# Patient Record
Sex: Female | Born: 1945 | Race: White | Hispanic: No | Smoking: Current some day smoker
Health system: Southern US, Community
[De-identification: ages and names within clinical notes are randomized; demographics above are authoritative.]

## PROBLEM LIST (undated history)

## (undated) ENCOUNTER — Inpatient Hospital Stay (HOSPITAL_COMMUNITY): Payer: Medicare Other

## (undated) DIAGNOSIS — R3915 Urgency of urination: Secondary | ICD-10-CM

## (undated) DIAGNOSIS — Z87442 Personal history of urinary calculi: Secondary | ICD-10-CM

## (undated) DIAGNOSIS — R112 Nausea with vomiting, unspecified: Secondary | ICD-10-CM

## (undated) DIAGNOSIS — M549 Dorsalgia, unspecified: Secondary | ICD-10-CM

## (undated) DIAGNOSIS — M2550 Pain in unspecified joint: Secondary | ICD-10-CM

## (undated) DIAGNOSIS — R351 Nocturia: Secondary | ICD-10-CM

## (undated) DIAGNOSIS — M2540 Effusion, unspecified joint: Secondary | ICD-10-CM

## (undated) DIAGNOSIS — F329 Major depressive disorder, single episode, unspecified: Secondary | ICD-10-CM

## (undated) DIAGNOSIS — Z8709 Personal history of other diseases of the respiratory system: Secondary | ICD-10-CM

## (undated) DIAGNOSIS — Z860100 Personal history of colon polyps, unspecified: Secondary | ICD-10-CM

## (undated) DIAGNOSIS — G4730 Sleep apnea, unspecified: Secondary | ICD-10-CM

## (undated) DIAGNOSIS — N289 Disorder of kidney and ureter, unspecified: Secondary | ICD-10-CM

## (undated) DIAGNOSIS — J45909 Unspecified asthma, uncomplicated: Secondary | ICD-10-CM

## (undated) DIAGNOSIS — G8929 Other chronic pain: Secondary | ICD-10-CM

## (undated) DIAGNOSIS — M1990 Unspecified osteoarthritis, unspecified site: Secondary | ICD-10-CM

## (undated) DIAGNOSIS — F32A Depression, unspecified: Secondary | ICD-10-CM

## (undated) DIAGNOSIS — M779 Enthesopathy, unspecified: Secondary | ICD-10-CM

## (undated) DIAGNOSIS — G4700 Insomnia, unspecified: Secondary | ICD-10-CM

## (undated) DIAGNOSIS — F419 Anxiety disorder, unspecified: Secondary | ICD-10-CM

## (undated) DIAGNOSIS — J189 Pneumonia, unspecified organism: Secondary | ICD-10-CM

## (undated) DIAGNOSIS — J449 Chronic obstructive pulmonary disease, unspecified: Secondary | ICD-10-CM

## (undated) DIAGNOSIS — Z9889 Other specified postprocedural states: Secondary | ICD-10-CM

## (undated) DIAGNOSIS — K219 Gastro-esophageal reflux disease without esophagitis: Secondary | ICD-10-CM

## (undated) DIAGNOSIS — E039 Hypothyroidism, unspecified: Secondary | ICD-10-CM

## (undated) HISTORY — PX: OTHER SURGICAL HISTORY: SHX169

## (undated) HISTORY — PX: COLONOSCOPY: SHX174

## (undated) HISTORY — PX: ESOPHAGOGASTRODUODENOSCOPY: SHX1529

## (undated) HISTORY — PX: ABDOMINAL HYSTERECTOMY: SHX81

---

## 2004-07-01 ENCOUNTER — Ambulatory Visit: Payer: Self-pay | Admitting: Psychiatry

## 2004-07-01 ENCOUNTER — Inpatient Hospital Stay (HOSPITAL_COMMUNITY): Admission: RE | Admit: 2004-07-01 | Discharge: 2004-07-07 | Payer: Self-pay | Admitting: Psychiatry

## 2009-08-03 ENCOUNTER — Encounter: Admission: RE | Admit: 2009-08-03 | Discharge: 2009-08-03 | Payer: Self-pay | Admitting: Specialist

## 2010-04-05 ENCOUNTER — Emergency Department (HOSPITAL_BASED_OUTPATIENT_CLINIC_OR_DEPARTMENT_OTHER): Admission: EM | Admit: 2010-04-05 | Discharge: 2010-04-05 | Payer: Self-pay | Admitting: Emergency Medicine

## 2010-06-19 ENCOUNTER — Encounter: Payer: Self-pay | Admitting: Specialist

## 2010-08-09 LAB — COMPREHENSIVE METABOLIC PANEL
ALT: 23 U/L (ref 0–35)
AST: 39 U/L — ABNORMAL HIGH (ref 0–37)
Albumin: 3.9 g/dL (ref 3.5–5.2)
Calcium: 9.1 mg/dL (ref 8.4–10.5)
GFR calc Af Amer: >60 mL/min (ref >60)
GFR calc non Af Amer: >60 mL/min (ref >60)
Sodium: 144 mEq/L (ref 135–145)
Total Bilirubin: 0.4 mg/dL (ref 0.3–1.2)
Total Protein: 6.9 g/dL (ref 6.0–8.3)

## 2010-08-09 LAB — DIFFERENTIAL
Basophils Absolute: 0.1 10*3/uL (ref 0.0–0.1)
Basophils Relative: 1 % (ref 0–1)
Eosinophils Absolute: 0.2 10*3/uL (ref 0.0–0.7)
Monocytes Absolute: 0.9 10*3/uL (ref 0.1–1.0)
Neutro Abs: 5.7 10*3/uL (ref 1.7–7.7)
Neutrophils Relative %: 48 % (ref 43–77)

## 2010-08-09 LAB — URINALYSIS, ROUTINE W REFLEX MICROSCOPIC
Hgb urine dipstick: NEGATIVE
Ketones, ur: NEGATIVE mg/dL
Nitrite: NEGATIVE
Specific Gravity, Urine: 1.009 (ref 1.005–1.030)

## 2010-08-09 LAB — CBC
MCHC: 33.7 g/dL (ref 30.0–36.0)
RDW: 14.5 % (ref 11.5–15.5)

## 2010-08-09 LAB — POCT TOXICOLOGY PANEL

## 2010-08-09 LAB — ETHANOL: Alcohol, Ethyl (B): <10 mg/dL (ref 0–10)

## 2010-10-14 NOTE — H&P (Signed)
NAME:  Patricia Sutton, Patricia Sutton NO.:  [id number]   MEDICAL RECORD NO.:  [id number]          PATIENT TYPE:  IPS   LOCATION:  0407                          FACILITY:  [facility]   PHYSICIAN:  Jeanice Lim, M.D. DATE OF BIRTH:  05-14-1946   DATE OF ADMISSION:  07/01/2004  DATE OF DISCHARGE:                         PSYCHIATRIC ADMISSION ASSESSMENT   IDENTIFYING INFORMATION:  This is a 65 year old married white female who  appears much older than stated age.  Apparently, the patient presented to  her mental health Neale Marzette yesterday complaining that she had been having  visual hallucinations of her deceased daughter for several days and had been  staying in bed the last few days.  Today, she states that increasing her  Seroquel last night got rid of the visual hallucinations, that she is not  seeing her daughter anymore.  Apparently, her daughter was diagnosed with  Hodgkin's disease after she graduated high school.  This was approximately  12 years ago.  Then, her daughter developed breast cancer and this is what  she eventually succumbed to.  The patient states that her depression began  when her daughter was diagnosed with Hodgkin's disease.   PAST PSYCHIATRIC HISTORY:  Her prior psychiatric care was an admission to  [facility] in 2004 and 2005.  She has been followed as an  outpatient since 2001 at [facility].   SOCIAL HISTORY:  She finished the 10th grade.  She has been married for 40  years.  She has not been employed outside of the home.  She was a homemaker.  She does have a son, age 28.   FAMILY HISTORY:  None.   ALCOHOL/DRUG HISTORY:  Negative.  She does smoke one pack of cigarettes per  day.   MEDICAL HISTORY:  Her medical care Adem Costlow is Dr. Malka So in [facility].  She is also followed by Dr. Judy Pimple in neurology.  Medical problems  are Parkinson's and tardive dyskinesia.  The patient has new dentures of a  month old that do  not fit correctly and that is also adding to her mouth  movements.   CURRENT MEDICATIONS:  Aricept 10 mg p.o. q.a.m., Effexor 300 mg in the  morning, 75 mg q.h.s., Seroquel 12.5 mg b.i.d., 50 mg q.h.s., Xanax 1 mg  t.i.d. and Wellbutrin 150 mg p.o. q.a.m.   ALLERGIES:  There were No known drug allergies.   PHYSICAL EXAMINATION:  Well-developed, well-nourished female in no acute  distress.  However, her face is quite lined and she appears much older than  her stated age.   LABORATORY DATA:  Her albumin is slightly low at 3.3 but, given that she has  not been eating well over the past month due to the denture change, that  would be reasonable.  She had no other abnormal lab findings.   MENTAL STATUS EXAM:  She is alert and oriented x 3.  Her appearance is  somewhat disheveled.  Otherwise, she is appropriately dressed.  Her gait and  motor are normal.  Her eye contact is good.  Her speech is  a normal rate,  rhythm and tone.  Her mood is somewhat depressed.  Her affect does not have  a full range.  It is sort of flat.  Her thought processes are clear,  rational and goal-oriented.  She wants to get better.  Judgment and insight  are intact.  Concentration and memory are intact.  She denies suicidal or  homicidal ideation.  She denies auditory or visual hallucinations last night  or today.  Last night, we increased her Seroquel at bedtime to 150 mg.   DIAGNOSES:   AXIS I:  Major depressive disorder, recurrent, severe with psychosis.   AXIS II:  None.   AXIS III:  1.  Parkinson's disease.  2.  Tardive dyskinesia.   AXIS IV:  Grief.   AXIS V:  35.   PLAN:  She will be admitted for safety and stabilization.  We will adjust  her medications.  Toward that end, we will stop her Effexor and we will  increase her Wellbutrin to 300 mg p.o. q.d. and increase the Seroquel to 150  mg q.h.s.      MD/MEDQ  D:  07/02/2004  T:  07/02/2004  Job:  [id number]

## 2010-10-14 NOTE — Discharge Summary (Signed)
NAME:  Patricia Sutton, Patricia Sutton NO.:  [id number]   MEDICAL RECORD NO.:  [id number]          PATIENT TYPE:  IPS   LOCATION:  0407                          FACILITY:  [facility]   PHYSICIAN:  Geoffery Lyons, M.D.      DATE OF BIRTH:  May 28, 1946   DATE OF ADMISSION:  07/01/2004  DATE OF DISCHARGE:  07/07/2004                                 DISCHARGE SUMMARY   CHIEF COMPLAINT/HISTORY OF PRESENT ILLNESS:  This is a first admission to  [facility] for this 65 year old married white  female who appears much older than her stated age.  She presented to her  mental health Daphyne Miguez complaining that she had been having visual  hallucinations of her deceased daughter for several days and has been  staying in bed the last few days.  Easily admitted, Seroquel was increased  and she felt that got rid of the hallucinations, endorsed that she was not  seeing her daughter anymore.  Her daughter was diagnosed with Hodgkin's  after she graduated from high school, 12 years prior to this admission.  Then she developed breast cancer which was what she succumbed to.   PAST PSYCHIATRIC HISTORY:  She been in [facility] before in 2004 and  2005.  Follow up by [facility] since 2001.   ALCOHOL OR DRUG HISTORY:  Denies the use or abuse of any substances.   PAST MEDICAL HISTORY:  Parkinson's and tardive dyskinesia.   MEDICATIONS:  1.  Aricept 10 mg in the morning.  2.  Effexor 300 mg in the morning and 75 mg at night.  3.  Seroquel 12.5 twice daily and 150 at night.  4.  Xanax 1 mg three times a day.  5.  Wellbutrin 150 in the morning.   PHYSICAL EXAMINATION:  Exam performed with no acute findings.   LABORATORY WORKUP:  CBC was within normal limits.  Blood chemistry was  within normal limits.  TSH 2.384.   MENTAL STATUS EXAM:  Reveals an alert, cooperative female somewhat  disheveled.  Eye contact was appropriate.  Speech was normal rate, rhythm  and tone.   Mood was depressed.  Affect was somewhat constricted.  Thought  processes were clear, rational and goal-oriented.  Endorsed that she wanted  to get better.  Judgment and insight were preserved.  Cognition was  preserved.   ADMISSION DIAGNOSES:   AXIS I:  Major depression with psychotic features.   AXIS II:  No diagnosis.   AXIS III:  1.  Parkinson's disease.  2.  Tardive dyskinesia.   AXIS IV:  Moderate.   AXIS V:  Upon admission 35.  Highest in the last year 65-70.   COURSE IN HOSPITAL:  She was admitted, started in individual and group  psychotherapy.  She was maintained on Effexor 375 mg per day, Xanax 1 mg  three times a day as needed, Seroquel 25 mg 1/2 twice a day and Seroquel 150  at night.  Artane 1 mg three times a day, Aricept 10 mg in the morning,  oxybutynin 5 mg three times  a day.  She evidenced depression and tearfulness  when talking about her daughter, having hallucinations seeing her daughter.  Husband seemed to be supportive.  She was very distraught, very tearful,  could hardly talk, crying, did not sleep the night before.  Still dealing  with the death of the daughter which happened 4 years ago, she was  overwhelmed and feeling so bad.  Mood depressed, agitated.  Affect  depressed, crying, irritable, unable to settle herself down.  She continued  to be quite upset.  We tapered the Effexor and increased the Seroquel at  night.  With increasing the Seroquel at night, she started sleeping better.  Endorsed that she could not remember the details of the daughter's death.  She said that she wanted to remember them but she could not.  When she can  remember she gets more upset.  On February 8 she endorsed that she was  starting to feel better.  We were weaning off the Effexor and starting  Wellbutrin.  She started sleeping better, able to talk more about the death  of the daughter.  Endorsed that she felt the medications were helping in  conversation with the  husband.  She reported remembering her daughter's  funeral for the first time and able to accept her death.  She denied any  current suicidal ideations.  She was much better, and there was no problem  with the medications.  Her mood improved.  Her affect was broad, no suicidal  or homicidal ideation.  She was willing to pursue further outpatient  treatment.   DISCHARGE DIAGNOSES:   AXIS I:  Major depression with psychotic features.   AXIS II:  No diagnosis.   AXIS III:  1.  Parkinson's disease.  2.  Tardive dyskinesia.   AXIS IV:  Moderate.   AXIS V:  On discharge 50.   DISCHARGE MEDICATIONS:  1.  Xanax 1 mg three times a day as needed.  2.  Seroquel 25 mg 1 b.i.d. and Seroquel 200 at night.  3.  Artane 2 mg 1/2 three times a day.  4.  Aricept 10 mg daily.  5.  Ditropan 5 mg three times a day.  6.  Wellbutrin XL 300 mg in the morning.  7.  Effexor XR 37.5 one twice a day.  8.  Ambien 10 at bedtime as needed for sleep.   FOLLOW UP:  [facility].      IL/MEDQ  D:  07/29/2004  T:  07/30/2004  Job:  [id number]

## 2011-01-30 ENCOUNTER — Emergency Department (INDEPENDENT_AMBULATORY_CARE_PROVIDER_SITE_OTHER): Payer: Medicare Other

## 2011-01-30 ENCOUNTER — Emergency Department (HOSPITAL_BASED_OUTPATIENT_CLINIC_OR_DEPARTMENT_OTHER)
Admission: EM | Admit: 2011-01-30 | Discharge: 2011-01-30 | Disposition: A | Discharge status: Home / Self Care | Payer: Medicare Other | Attending: Emergency Medicine | Admitting: Emergency Medicine

## 2011-01-30 ENCOUNTER — Other Ambulatory Visit: Payer: Self-pay

## 2011-01-30 ENCOUNTER — Encounter: Payer: Self-pay | Admitting: *Deleted

## 2011-01-30 DIAGNOSIS — N390 Urinary tract infection, site not specified: Secondary | ICD-10-CM

## 2011-01-30 DIAGNOSIS — X58XXXA Exposure to other specified factors, initial encounter: Secondary | ICD-10-CM

## 2011-01-30 DIAGNOSIS — R5381 Other malaise: Secondary | ICD-10-CM

## 2011-01-30 DIAGNOSIS — F329 Major depressive disorder, single episode, unspecified: Secondary | ICD-10-CM | POA: Insufficient documentation

## 2011-01-30 DIAGNOSIS — E876 Hypokalemia: Secondary | ICD-10-CM

## 2011-01-30 DIAGNOSIS — S2239XA Fracture of one rib, unspecified side, initial encounter for closed fracture: Secondary | ICD-10-CM

## 2011-01-30 DIAGNOSIS — F17200 Nicotine dependence, unspecified, uncomplicated: Secondary | ICD-10-CM | POA: Insufficient documentation

## 2011-01-30 DIAGNOSIS — E119 Type 2 diabetes mellitus without complications: Secondary | ICD-10-CM | POA: Insufficient documentation

## 2011-01-30 DIAGNOSIS — R5383 Other fatigue: Secondary | ICD-10-CM | POA: Insufficient documentation

## 2011-01-30 DIAGNOSIS — F3289 Other specified depressive episodes: Secondary | ICD-10-CM | POA: Insufficient documentation

## 2011-01-30 HISTORY — DX: Major depressive disorder, single episode, unspecified: F32.9

## 2011-01-30 HISTORY — DX: Depression, unspecified: F32.A

## 2011-01-30 LAB — DIFFERENTIAL
Basophils Relative: 0 % (ref 0–1)
Eosinophils Absolute: 0.2 10*3/uL (ref 0.0–0.7)
Eosinophils Relative: 3 % (ref 0–5)
Lymphocytes Relative: 31 % (ref 12–46)
Lymphs Abs: 2.7 10*3/uL (ref 0.7–4.0)
Monocytes Absolute: 0.9 10*3/uL (ref 0.1–1.0)
Monocytes Relative: 10 % (ref 3–12)

## 2011-01-30 LAB — BASIC METABOLIC PANEL
BUN: 9 mg/dL (ref 6–23)
Creatinine, Ser: 0.8 mg/dL (ref 0.50–1.10)
GFR calc Af Amer: >60 mL/min (ref >60)
GFR calc non Af Amer: >60 mL/min (ref >60)
Glucose, Bld: 112 mg/dL — ABNORMAL HIGH (ref 70–99)
Potassium: 3.2 mEq/L — ABNORMAL LOW (ref 3.5–5.1)

## 2011-01-30 LAB — URINE MICROSCOPIC-ADD ON

## 2011-01-30 LAB — URINALYSIS, ROUTINE W REFLEX MICROSCOPIC
Bilirubin Urine: NEGATIVE
Glucose, UA: NEGATIVE mg/dL
Nitrite: POSITIVE — AB
Specific Gravity, Urine: 1.009 (ref 1.005–1.030)

## 2011-01-30 LAB — CBC
HCT: 37.3 % (ref 36.0–46.0)
Hemoglobin: 12.4 g/dL (ref 12.0–15.0)
MCH: 31.2 pg (ref 26.0–34.0)
RBC: 3.98 MIL/uL (ref 3.87–5.11)
RDW: 13.9 % (ref 11.5–15.5)

## 2011-01-30 LAB — GLUCOSE, CAPILLARY: Glucose-Capillary: 117 mg/dL — ABNORMAL HIGH (ref 70–99)

## 2011-01-30 MED ORDER — SODIUM CHLORIDE 0.9 % IV BOLUS (SEPSIS)
500.0000 mL | Freq: Once | INTRAVENOUS | Status: AC
  Administered 2011-01-30: 500 mL via INTRAVENOUS

## 2011-01-30 MED ORDER — DEXTROSE 5 % IV SOLN
1.0000 g | Freq: Once | INTRAVENOUS | Status: AC
  Administered 2011-01-30: 1 g via INTRAVENOUS
  Filled 2011-01-30: qty 1

## 2011-01-30 MED ORDER — SULFAMETHOXAZOLE-TRIMETHOPRIM 800-160 MG PO TABS: 1.0000 | ORAL_TABLET | Freq: Two times a day (BID) | ORAL | Status: AC

## 2011-01-30 NOTE — ED Provider Notes (Signed)
History     CSN: [id number] Arrival date & time: 01/30/2011  1:21 PM  Chief Complaint  Patient presents with  . Weakness   HPI Comments: Pt state that she is having urinary frequency  Patient is a 65 y.o. female presenting with weakness. The history is provided by the patient and a relative. No language interpreter was used.  Weakness Primary symptoms do not include loss of sensation, fever, nausea or vomiting. The symptoms are improving. The neurological symptoms are diffuse.  Additional symptoms include weakness. Additional symptoms do not include tinnitus.    Past Medical History  Diagnosis Date  . Depression   . Diabetes mellitus     No past surgical history on file.  No family history on file.  History  Substance Use Topics  . Smoking status: Current Everyday Smoker -- 0.5 packs/day  . Smokeless tobacco: Not on file  . Alcohol Use: No    OB History    Grav Para Term Preterm Abortions TAB SAB Ect Mult Living                  Review of Systems  Constitutional: Negative for fever.  HENT: Negative for tinnitus.   Respiratory: Negative for cough.   Gastrointestinal: Negative for nausea and vomiting.  Neurological: Positive for weakness.  All other systems reviewed and are negative.    Physical Exam  BP 126/64  Pulse 64  Temp(Src) 98.7 F (37.1 C) (Oral)  Resp 20  SpO2 96%  Physical Exam  Nursing note and vitals reviewed. Constitutional: She is oriented to person, place, and time. She appears well-developed and well-nourished.  HENT:  Head: Normocephalic.  Neck: Normal range of motion. Neck supple.  Cardiovascular: Normal rate and regular rhythm.   Pulmonary/Chest: Effort normal and breath sounds normal.  Abdominal: Soft. Bowel sounds are normal.  Musculoskeletal: Normal range of motion.  Neurological: She is alert and oriented to person, place, and time.  Skin: Skin is warm and dry.  Psychiatric: She has a normal mood and affect.    ED Course    Procedures Results for orders placed during the hospital encounter of 01/30/11  URINALYSIS, ROUTINE W REFLEX MICROSCOPIC      Component Value Range   Color, Urine YELLOW  YELLOW    Appearance CLOUDY (*) CLEAR    Specific Gravity, Urine 1.009  1.005 - 1.030    pH 6.5  5.0 - 8.0    Glucose, UA NEGATIVE  NEGATIVE (mg/dL)   Hgb urine dipstick TRACE (*) NEGATIVE    Bilirubin Urine NEGATIVE  NEGATIVE    Ketones, ur NEGATIVE  NEGATIVE (mg/dL)   Protein, ur NEGATIVE  NEGATIVE (mg/dL)   Urobilinogen, UA 2.0 (*) 0.0 - 1.0 (mg/dL)   Nitrite POSITIVE (*) NEGATIVE    Leukocytes, UA LARGE (*) NEGATIVE   URINE MICROSCOPIC-ADD ON      Component Value Range   Squamous Epithelial / LPF FEW (*) RARE    WBC, UA TOO NUMEROUS TO COUNT  <3 (WBC/hpf)   RBC / HPF 0-2  <3 (RBC/hpf)   Bacteria, UA MANY (*) RARE   GLUCOSE, CAPILLARY      Component Value Range   Glucose-Capillary 117 (*) 70 - 99 (mg/dL)  CBC      Component Value Range   WBC 8.8  4.0 - 10.5 (K/uL)   RBC 3.98  3.87 - 5.11 (MIL/uL)   Hemoglobin 12.4  12.0 - 15.0 (g/dL)   HCT 21.3  08.6 - 57.8 (%)  MCV 93.7  78.0 - 100.0 (fL)   MCH 31.2  26.0 - 34.0 (pg)   MCHC 33.2  30.0 - 36.0 (g/dL)   RDW 62.9  52.8 - 41.3 (%)   Platelets 307  150 - 400 (K/uL)  DIFFERENTIAL      Component Value Range   Neutrophils Relative 56  43 - 77 (%)   Neutro Abs 5.0  1.7 - 7.7 (K/uL)   Lymphocytes Relative 31  12 - 46 (%)   Lymphs Abs 2.7  0.7 - 4.0 (K/uL)   Monocytes Relative 10  3 - 12 (%)   Monocytes Absolute 0.9  0.1 - 1.0 (K/uL)   Eosinophils Relative 3  0 - 5 (%)   Eosinophils Absolute 0.2  0.0 - 0.7 (K/uL)   Basophils Relative 0  0 - 1 (%)   Basophils Absolute 0.0  0.0 - 0.1 (K/uL)  BASIC METABOLIC PANEL      Component Value Range   Sodium 141  135 - 145 (mEq/L)   Potassium 3.2 (*) 3.5 - 5.1 (mEq/L)   Chloride 100  96 - 112 (mEq/L)   CO2 32  19 - 32 (mEq/L)   Glucose, Bld 112 (*) 70 - 99 (mg/dL)   BUN 9  6 - 23 (mg/dL)   Creatinine, Ser 2.44   0.50 - 1.10 (mg/dL)   Calcium 9.4  8.4 - 01.0 (mg/dL)   GFR calc non Af Amer >60  >60 (mL/min)   GFR calc Af Amer >60  >60 (mL/min)   Dg Chest 2 View  01/30/2011  *RADIOLOGY REPORT*  Clinical Data: Weakness.  CHEST - 2 VIEW  Comparison: None.  Findings: Trachea is midline.  Heart size normal.  Mild biapical pleural parenchymal scarring.  Mild hyperinflation.  Lungs are otherwise clear.  No pleural fluid.  Degenerative changes are seen in the spine.  There is an age indeterminate fracture of the right seventh lateral rib.  IMPRESSION:  1.  Age indeterminate fracture of the right seventh lateral rib. 2.  No acute intrathoracic findings.  Original Report Authenticated By: Reyes Ivan, M.D.     MDM Pt ambulated by nurse without any problem:pt treated for uti here:Pt is tolerating po here      Teressa Lower, NP 01/30/11 1626  Teressa Lower, NP 01/30/11 1631

## 2011-01-30 NOTE — ED Notes (Signed)
Pt. Expressed need to urinate.  RN Earlene Plater walked Pt. To restroom.  Pt. Has unstedy gait to restroom but walked and did not have complications.

## 2011-01-30 NOTE — ED Provider Notes (Signed)
Medical screening examination/treatment/procedure(s) were performed by non-physician practitioner and as supervising physician I was immediately available for consultation/collaboration.   Jhovanny Guinta, MD 01/30/11 2331 

## 2011-01-30 NOTE — ED Notes (Signed)
Family at bedside. 

## 2011-01-30 NOTE — ED Notes (Signed)
Weakness and soreness x 4 days. Thinks she has a UTI.

## 2011-01-30 NOTE — ED Notes (Signed)
Patient is resting comfortably. 

## 2011-01-30 NOTE — ED Notes (Signed)
Pt. Opens eyes to voice and follows commands of RN and EDP.  Pt. In no resp. Distress and is alert and oriented when not sleeping.

## 2011-01-30 NOTE — ED Notes (Signed)
Vital signs stable. 

## 2011-01-30 NOTE — ED Notes (Signed)
Pt. Expressed that is burns to urinate and she has trouble urinating.

## 2011-01-30 NOTE — ED Notes (Signed)
Patient denies pain and is resting comfortably.  

## 2011-03-09 ENCOUNTER — Emergency Department (INDEPENDENT_AMBULATORY_CARE_PROVIDER_SITE_OTHER): Payer: Medicare Other

## 2011-03-09 ENCOUNTER — Emergency Department (HOSPITAL_BASED_OUTPATIENT_CLINIC_OR_DEPARTMENT_OTHER)
Admission: EM | Admit: 2011-03-09 | Discharge: 2011-03-09 | Disposition: A | Discharge status: Home / Self Care | Payer: Medicare Other | Attending: Emergency Medicine | Admitting: Emergency Medicine

## 2011-03-09 ENCOUNTER — Encounter (HOSPITAL_BASED_OUTPATIENT_CLINIC_OR_DEPARTMENT_OTHER): Payer: Self-pay | Admitting: *Deleted

## 2011-03-09 DIAGNOSIS — R5381 Other malaise: Secondary | ICD-10-CM | POA: Insufficient documentation

## 2011-03-09 DIAGNOSIS — R630 Anorexia: Secondary | ICD-10-CM

## 2011-03-09 DIAGNOSIS — F17200 Nicotine dependence, unspecified, uncomplicated: Secondary | ICD-10-CM | POA: Insufficient documentation

## 2011-03-09 DIAGNOSIS — E119 Type 2 diabetes mellitus without complications: Secondary | ICD-10-CM | POA: Insufficient documentation

## 2011-03-09 DIAGNOSIS — R05 Cough: Secondary | ICD-10-CM

## 2011-03-09 DIAGNOSIS — Z87891 Personal history of nicotine dependence: Secondary | ICD-10-CM

## 2011-03-09 DIAGNOSIS — F329 Major depressive disorder, single episode, unspecified: Secondary | ICD-10-CM | POA: Insufficient documentation

## 2011-03-09 DIAGNOSIS — N390 Urinary tract infection, site not specified: Secondary | ICD-10-CM

## 2011-03-09 DIAGNOSIS — F3289 Other specified depressive episodes: Secondary | ICD-10-CM | POA: Insufficient documentation

## 2011-03-09 LAB — COMPREHENSIVE METABOLIC PANEL
ALT: 12 U/L (ref 0–35)
BUN: 9 mg/dL (ref 6–23)
CO2: 29 mEq/L (ref 19–32)
Calcium: 9.4 mg/dL (ref 8.4–10.5)
GFR calc Af Amer: >90 mL/min (ref >90)
GFR calc non Af Amer: >90 mL/min (ref >90)
Glucose, Bld: 119 mg/dL — ABNORMAL HIGH (ref 70–99)
Sodium: 143 mEq/L (ref 135–145)
Total Protein: 6.8 g/dL (ref 6.0–8.3)

## 2011-03-09 LAB — DIFFERENTIAL
Eosinophils Absolute: 0.5 10*3/uL (ref 0.0–0.7)
Eosinophils Relative: 6 % — ABNORMAL HIGH (ref 0–5)
Lymphocytes Relative: 43 % (ref 12–46)
Lymphs Abs: 3.6 10*3/uL (ref 0.7–4.0)
Monocytes Absolute: 0.8 10*3/uL (ref 0.1–1.0)
Monocytes Relative: 9 % (ref 3–12)

## 2011-03-09 LAB — URINALYSIS, ROUTINE W REFLEX MICROSCOPIC
Bilirubin Urine: NEGATIVE
Hgb urine dipstick: NEGATIVE
Protein, ur: NEGATIVE mg/dL
Specific Gravity, Urine: 1.022 (ref 1.005–1.030)
Urobilinogen, UA: 0.2 mg/dL (ref 0.0–1.0)

## 2011-03-09 LAB — CBC
HCT: 38.5 % (ref 36.0–46.0)
Hemoglobin: 12.8 g/dL (ref 12.0–15.0)
MCH: 30.1 pg (ref 26.0–34.0)
MCV: 90.6 fL (ref 78.0–100.0)
RBC: 4.25 MIL/uL (ref 3.87–5.11)
WBC: 8.3 10*3/uL (ref 4.0–10.5)

## 2011-03-09 LAB — URINE MICROSCOPIC-ADD ON

## 2011-03-09 MED ORDER — FLUCONAZOLE 50 MG PO TABS
150.0000 mg | ORAL_TABLET | Freq: Once | ORAL | Status: AC
  Administered 2011-03-09: 150 mg via ORAL
  Filled 2011-03-09: qty 1

## 2011-03-09 MED ORDER — CEPHALEXIN 250 MG PO CAPS
500.0000 mg | ORAL_CAPSULE | Freq: Once | ORAL | Status: AC
  Administered 2011-03-09: 500 mg via ORAL
  Filled 2011-03-09: qty 2

## 2011-03-09 MED ORDER — SODIUM CHLORIDE 0.9 % IV SOLN: Freq: Once | INTRAVENOUS | Status: DC

## 2011-03-09 MED ORDER — DEXTROSE 5 % IV SOLN
1.0000 g | INTRAVENOUS | Status: DC
  Administered 2011-03-09: 1 g via INTRAVENOUS
  Filled 2011-03-09: qty 1

## 2011-03-09 MED ORDER — CEPHALEXIN 500 MG PO CAPS: 500.0000 mg | ORAL_CAPSULE | Freq: Four times a day (QID) | ORAL | Status: AC

## 2011-03-09 NOTE — ED Provider Notes (Signed)
History     CSN: [id number] Arrival date & time: 03/09/2011  2:19 PM  Chief Complaint  Patient presents with  . Weakness    (Consider location/radiation/quality/duration/timing/severity/associated sxs/prior treatment) Patient is a 65 y.o. female presenting with general illness.  Illness  The current episode started 2 days ago. The onset was gradual. The problem occurs continuously. The problem has been gradually worsening. The problem is moderate. The symptoms are relieved by nothing (NOTHING). The symptoms are aggravated by nothing. Associated symptoms include nausea, muscle aches and cough. Pertinent negatives include no fever, no URI and no wheezing. She has been behaving normally. There were no sick contacts. Recently, medical care has been given by the PCP.  Pt complains of burning with urination.  Pt reports she has had a uti in the past and felt similar.  Pt complains of feeling weak.  Past Medical History  Diagnosis Date  . Depression   . Diabetes mellitus     Past Surgical History  Procedure Date  . Abdominal hysterectomy     No family history on file.  History  Substance Use Topics  . Smoking status: Current Everyday Smoker -- 0.5 packs/day  . Smokeless tobacco: Not on file  . Alcohol Use: No    OB History    Grav Para Term Preterm Abortions TAB SAB Ect Mult Living                  Review of Systems  Constitutional: Negative for fever.  Respiratory: Positive for cough. Negative for wheezing.   Gastrointestinal: Positive for nausea.  Genitourinary: Positive for dysuria.  All other systems reviewed and are negative.    Allergies  Review of patient's allergies indicates no known allergies.  Home Medications   Current Outpatient Rx  Name Route Sig Dispense Refill  . BUSPIRONE HCL 7.5 MG PO TABS Oral Take 7.5 mg by mouth 2 (two) times daily.      . CYMBALTA PO Oral Take by mouth.      . AMBIEN PO Oral Take by mouth.      Marland Kitchen CLINDAMYCIN HCL 300 MG PO  CAPS Oral Take 300 mg by mouth 3 (three) times daily.        BP 128/65  Pulse 75  Temp(Src) 98.2 F (36.8 C) (Oral)  Resp 20  SpO2 95%  Physical Exam  Nursing note and vitals reviewed. Constitutional: She appears well-developed and well-nourished.  HENT:  Head: Normocephalic and atraumatic.  Right Ear: External ear normal.  Left Ear: External ear normal.  Eyes: Conjunctivae and EOM are normal. Pupils are equal, round, and reactive to light.  Neck: Normal range of motion. Neck supple.  Cardiovascular: Normal rate and normal heart sounds.   Pulmonary/Chest: Effort normal.  Abdominal: Soft.  Musculoskeletal: Normal range of motion.  Neurological: She is alert.  Skin: Skin is warm.    ED Course  Procedures (including critical care time)  Labs Reviewed  URINALYSIS, ROUTINE W REFLEX MICROSCOPIC - Abnormal; Notable for the following:    Appearance CLOUDY (*)    Nitrite POSITIVE (*)    Leukocytes, UA SMALL (*)    All other components within normal limits  URINE MICROSCOPIC-ADD ON - Abnormal; Notable for the following:    Bacteria, UA MANY (*)    Crystals CA OXALATE CRYSTALS (*)    All other components within normal limits   No results found.   No diagnosis found.    MDM  Pt given IV rocephin,  IV fluids x  1 liter Pt advised to recheck with her MD tomorrow as scheduled.       Langston Masker, [place] 03/09/11 1843

## 2011-03-09 NOTE — ED Notes (Signed)
Pt says she has been in the bed since Sunday because she is so weak and feels "swimmy-headed" when she gets up.  Also c/o pain, itching and soreness with urination for a couple of weeks

## 2011-03-09 NOTE — ED Provider Notes (Signed)
Medical screening examination/treatment/procedure(s) were performed by non-physician practitioner and as supervising physician I was immediately available for consultation/collaboration.   Leigh-Ann Kolten Ryback, MD 03/09/11 1905 

## 2011-06-29 ENCOUNTER — Emergency Department (INDEPENDENT_AMBULATORY_CARE_PROVIDER_SITE_OTHER): Payer: Medicare Other

## 2011-06-29 ENCOUNTER — Other Ambulatory Visit: Payer: Self-pay

## 2011-06-29 ENCOUNTER — Encounter (HOSPITAL_BASED_OUTPATIENT_CLINIC_OR_DEPARTMENT_OTHER): Payer: Self-pay | Admitting: *Deleted

## 2011-06-29 ENCOUNTER — Emergency Department (HOSPITAL_BASED_OUTPATIENT_CLINIC_OR_DEPARTMENT_OTHER)
Admission: EM | Admit: 2011-06-29 | Discharge: 2011-06-29 | Disposition: A | Discharge status: Other Acute Inpatient Hospital | Payer: Medicare Other | Attending: Emergency Medicine | Admitting: Emergency Medicine

## 2011-06-29 DIAGNOSIS — K56609 Unspecified intestinal obstruction, unspecified as to partial versus complete obstruction: Secondary | ICD-10-CM | POA: Insufficient documentation

## 2011-06-29 DIAGNOSIS — R1013 Epigastric pain: Secondary | ICD-10-CM | POA: Insufficient documentation

## 2011-06-29 DIAGNOSIS — R109 Unspecified abdominal pain: Secondary | ICD-10-CM

## 2011-06-29 DIAGNOSIS — R9431 Abnormal electrocardiogram [ECG] [EKG]: Secondary | ICD-10-CM | POA: Insufficient documentation

## 2011-06-29 DIAGNOSIS — M549 Dorsalgia, unspecified: Secondary | ICD-10-CM

## 2011-06-29 DIAGNOSIS — R112 Nausea with vomiting, unspecified: Secondary | ICD-10-CM | POA: Insufficient documentation

## 2011-06-29 DIAGNOSIS — F17200 Nicotine dependence, unspecified, uncomplicated: Secondary | ICD-10-CM | POA: Insufficient documentation

## 2011-06-29 LAB — URINALYSIS, ROUTINE W REFLEX MICROSCOPIC
Bilirubin Urine: NEGATIVE
Bilirubin Urine: NEGATIVE
Glucose, UA: NEGATIVE mg/dL
Hgb urine dipstick: NEGATIVE
Ketones, ur: NEGATIVE mg/dL
Ketones, ur: NEGATIVE mg/dL
Leukocytes, UA: NEGATIVE
Nitrite: NEGATIVE
Nitrite: NEGATIVE
Specific Gravity, Urine: 1.004 — ABNORMAL LOW (ref 1.005–1.030)
Urobilinogen, UA: 0.2 mg/dL (ref 0.0–1.0)
pH: 6 (ref 5.0–8.0)
pH: 6 (ref 5.0–8.0)

## 2011-06-29 LAB — DIFFERENTIAL
Basophils Absolute: 0 10*3/uL (ref 0.0–0.1)
Basophils Relative: 0 % (ref 0–1)
Eosinophils Absolute: 0.3 10*3/uL (ref 0.0–0.7)
Neutro Abs: 4.8 10*3/uL (ref 1.7–7.7)
Neutrophils Relative %: 47 % (ref 43–77)

## 2011-06-29 LAB — GLUCOSE, CAPILLARY: Glucose-Capillary: 91 mg/dL (ref 70–99)

## 2011-06-29 LAB — COMPREHENSIVE METABOLIC PANEL
ALT: 12 U/L (ref 0–35)
AST: 17 U/L (ref 0–37)
Albumin: 3.6 g/dL (ref 3.5–5.2)
Alkaline Phosphatase: 101 U/L (ref 39–117)
Chloride: 102 mEq/L (ref 96–112)
Potassium: 4.5 mEq/L (ref 3.5–5.1)
Sodium: 140 mEq/L (ref 135–145)
Total Bilirubin: 0.1 mg/dL — ABNORMAL LOW (ref 0.3–1.2)
Total Protein: 7 g/dL (ref 6.0–8.3)

## 2011-06-29 LAB — TROPONIN I: Troponin I: <0.3 ng/mL (ref <0.30)

## 2011-06-29 LAB — CBC
MCH: 30.8 pg (ref 26.0–34.0)
MCHC: 34.1 g/dL (ref 30.0–36.0)
Platelets: 357 10*3/uL (ref 150–400)

## 2011-06-29 MED ORDER — HYDROMORPHONE HCL PF 1 MG/ML IJ SOLN
1.0000 mg | Freq: Once | INTRAMUSCULAR | Status: AC
  Administered 2011-06-29: 1 mg via INTRAVENOUS
  Filled 2011-06-29: qty 1

## 2011-06-29 MED ORDER — IOHEXOL 300 MG/ML  SOLN
100.0000 mL | Freq: Once | INTRAMUSCULAR | Status: AC | PRN
  Administered 2011-06-29: 100 mL via INTRAVENOUS

## 2011-06-29 MED ORDER — HYDROMORPHONE HCL PF 1 MG/ML IJ SOLN
1.0000 mg | Freq: Once | INTRAMUSCULAR | Status: AC
  Administered 2011-06-29: 1 mg via INTRAVENOUS

## 2011-06-29 MED ORDER — ONDANSETRON HCL 4 MG/2ML IJ SOLN
4.0000 mg | Freq: Once | INTRAMUSCULAR | Status: AC
  Administered 2011-06-29: 4 mg via INTRAVENOUS
  Filled 2011-06-29: qty 2

## 2011-06-29 MED ORDER — HYDROMORPHONE HCL PF 1 MG/ML IJ SOLN
INTRAMUSCULAR | Status: AC
  Administered 2011-06-29: 1 mg via INTRAVENOUS
  Filled 2011-06-29: qty 1

## 2011-06-29 MED ORDER — BENZOCAINE 20 % MT SOLN
OROMUCOSAL | Status: AC
  Administered 2011-06-29: 17:00:00
  Filled 2011-06-29: qty 57

## 2011-06-29 NOTE — ED Provider Notes (Addendum)
History     CSN: [id number]  Arrival date & time 06/29/11  1330   First MD Initiated Contact with Patient 06/29/11 1344      Chief Complaint  Patient presents with  . Abdominal Pain    (Consider location/radiation/quality/duration/timing/severity/associated sxs/prior treatment) HPI Comments: Patient is a 66 year old she started having pain in the right side of her back week and a half ago. This seemed to hurt her more when she would walk. He has gotten worse over the week and a half. Now the pain has gone into her upper abdomen. She had nausea yesterday and vomiting today. She took ibuprofen without relief. She called her physician, Oneal Grout, M.D. who advised her to seek evaluation in the ED.  Patient is a 66 y.o. female presenting with abdominal pain. The history is provided by the patient. No language interpreter was used.  Abdominal Pain The primary symptoms of the illness include abdominal pain, nausea and vomiting. The primary symptoms of the illness do not include fever. The current episode started more than 2 days ago. The onset of the illness was gradual. The problem has been gradually worsening.  The abdominal pain began more than 2 days ago. The pain came on gradually. The abdominal pain has been gradually worsening since its onset. The abdominal pain is located in the epigastric region. The abdominal pain does not radiate. The severity of the abdominal pain is 5/10. The abdominal pain is relieved by nothing. Exacerbated by: Nothing.  The patient has not had a change in bowel habit. Additional symptoms associated with the illness include chills, anorexia and back pain. Symptoms associated with the illness do not include constipation, urgency or hematuria. Significant associated medical issues include diabetes.    Past Medical History  Diagnosis Date  . Depression   . Diabetes mellitus     Past Surgical History  Procedure Date  . Abdominal hysterectomy     History  reviewed. No pertinent family history.  History  Substance Use Topics  . Smoking status: Current Everyday Smoker -- 0.5 packs/day  . Smokeless tobacco: Not on file  . Alcohol Use: No    OB History    Grav Para Term Preterm Abortions TAB SAB Ect Mult Living                  Review of Systems  Constitutional: Positive for chills. Negative for fever.  HENT: Negative.   Eyes: Negative.   Respiratory: Negative.   Cardiovascular: Negative.   Gastrointestinal: Positive for nausea, vomiting, abdominal pain and anorexia. Negative for constipation.  Genitourinary: Negative.  Negative for urgency and hematuria.  Musculoskeletal: Positive for back pain.  Neurological: Negative.   Psychiatric/Behavioral: Negative.     Allergies  Review of patient's allergies indicates no known allergies.  Home Medications   Current Outpatient Rx  Name Route Sig Dispense Refill  . BUSPIRONE HCL 7.5 MG PO TABS Oral Take 7.5 mg by mouth 2 (two) times daily.      Marland Kitchen CLINDAMYCIN HCL 300 MG PO CAPS Oral Take 300 mg by mouth 3 (three) times daily.      . CYMBALTA PO Oral Take by mouth.      . AMBIEN PO Oral Take by mouth.        BP 135/63  Pulse 79  Temp(Src) 98.7 F (37.1 C) (Oral)  Resp 22  SpO2 98%  Physical Exam  Nursing note and vitals reviewed. Constitutional: She is oriented to person, place, and time. She appears well-developed  and well-nourished.       In moderate distress with upper abdominal pain.  HENT:  Head: Normocephalic and atraumatic.  Right Ear: External ear normal.  Left Ear: External ear normal.  Mouth/Throat: Oropharynx is clear and moist.       Edentulous  Eyes: Conjunctivae and EOM are normal. Pupils are equal, round, and reactive to light.  Neck: Normal range of motion. Neck supple. No thyromegaly present.  Cardiovascular: Normal rate, regular rhythm and normal heart sounds.   Pulmonary/Chest: Effort normal and breath sounds normal.  Abdominal: Soft. Bowel sounds are  normal.       She localizes pain to the epigastric region. There is no mass rebound or rigidity there.  Musculoskeletal: Normal range of motion.  Neurological: She is alert and oriented to person, place, and time.       No sensory or motor deficit.  Skin: Skin is warm and dry.  Psychiatric: She has a normal mood and affect. Her behavior is normal.    ED Course  Procedures (including critical care time)  Labs Reviewed  URINALYSIS, ROUTINE W REFLEX MICROSCOPIC - Abnormal; Notable for the following:    Specific Gravity, Urine 1.004 (*)    All other components within normal limits  CBC  DIFFERENTIAL  COMPREHENSIVE METABOLIC PANEL  URINALYSIS, ROUTINE W REFLEX MICROSCOPIC  URINE CULTURE  LIPASE, BLOOD   2:20 PM Pt seen --> physical exam performed.  Lab workup ordered.  CT of abdomen/pelvis ordered.  IV fluid, IV medications for pain and nausea ordered.    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  Low voltage in limb leads.  ST/T Wave abnormalities: normal  Conduction Disutrbances:none  Narrative Interpretation: Abnormal EKG  Old EKG Reviewed: none available  4:08 PM Results for orders placed during the hospital encounter of 06/29/11  URINALYSIS, ROUTINE W REFLEX MICROSCOPIC      Component Value Range   Color, Urine YELLOW  YELLOW    APPearance CLEAR  CLEAR    Specific Gravity, Urine 1.004 (*) 1.005 - 1.030    pH 6.0  5.0 - 8.0    Glucose, UA NEGATIVE  NEGATIVE (mg/dL)   Hgb urine dipstick NEGATIVE  NEGATIVE    Bilirubin Urine NEGATIVE  NEGATIVE    Ketones, ur NEGATIVE  NEGATIVE (mg/dL)   Protein, ur NEGATIVE  NEGATIVE (mg/dL)   Urobilinogen, UA 0.2  0.0 - 1.0 (mg/dL)   Nitrite NEGATIVE  NEGATIVE    Leukocytes, UA NEGATIVE  NEGATIVE   CBC      Component Value Range   WBC 10.3  4.0 - 10.5 (K/uL)   RBC 4.35  3.87 -  5.11 (MIL/uL)   Hemoglobin 13.4  12.0 - 15.0 (g/dL)   HCT 45.4  09.8 - 11.9 (%)   MCV 90.3  78.0 - 100.0 (fL)   MCH 30.8  26.0 - 34.0 (pg)   MCHC 34.1  30.0 - 36.0 (g/dL)   RDW 14.7  82.9 - 56.2 (%)   Platelets 357  150 - 400 (K/uL)  DIFFERENTIAL      Component Value Range   Neutrophils Relative 47  43 - 77 (%)   Neutro Abs 4.8  1.7 - 7.7 (K/uL)   Lymphocytes Relative 41  12 - 46 (%)   Lymphs Abs 4.2 (*) 0.7 - 4.0 (K/uL)   Monocytes Relative 10  3 - 12 (%)   Monocytes Absolute 1.0  0.1 - 1.0 (K/uL)   Eosinophils Relative 3  0 - 5 (%)   Eosinophils Absolute 0.3  0.0 - 0.7 (K/uL)   Basophils Relative 0  0 - 1 (%)   Basophils Absolute 0.0  0.0 - 0.1 (K/uL)  COMPREHENSIVE METABOLIC PANEL      Component Value Range   Sodium 140  135 - 145 (mEq/L)   Potassium 4.5  3.5 - 5.1 (mEq/L)   Chloride 102  96 - 112 (mEq/L)   CO2 29  19 - 32 (mEq/L)   Glucose, Bld 129 (*) 70 - 99 (mg/dL)   BUN 13  6 - 23 (mg/dL)   Creatinine, Ser 8.11  0.50 - 1.10 (mg/dL)   Calcium 9.7  8.4 - 91.4 (mg/dL)   Total Protein 7.0  6.0 - 8.3 (g/dL)   Albumin 3.6  3.5 - 5.2 (g/dL)   AST 17  0 - 37 (U/L)   ALT 12  0 - 35 (U/L)   Alkaline Phosphatase 101  39 - 117 (U/L)   Total Bilirubin 0.1 (*) 0.3 - 1.2 (mg/dL)   GFR calc non Af Amer 77 (*) >90 (mL/min)   GFR calc Af Amer 89 (*) >90 (mL/min)  URINALYSIS, ROUTINE W REFLEX MICROSCOPIC      Component Value Range   Color, Urine YELLOW  YELLOW    APPearance CLEAR  CLEAR    Specific Gravity, Urine 1.004 (*) 1.005 - 1.030    pH 6.0  5.0 - 8.0    Glucose, UA NEGATIVE  NEGATIVE (mg/dL)   Hgb urine dipstick NEGATIVE  NEGATIVE    Bilirubin Urine NEGATIVE  NEGATIVE    Ketones, ur NEGATIVE  NEGATIVE (mg/dL)   Protein, ur NEGATIVE  NEGATIVE (mg/dL)   Urobilinogen, UA 0.2  0.0 - 1.0 (mg/dL)   Nitrite NEGATIVE  NEGATIVE    Leukocytes, UA NEGATIVE  NEGATIVE   LIPASE, BLOOD      Component Value Range   Lipase 50  11 - 59 (U/L)  TROPONIN I      Component Value Range    Troponin I <0.30  <0.30 (ng/mL)   Ct Abdomen Pelvis W Contrast  06/29/2011  *RADIOLOGY REPORT*  Clinical Data: Back pain.  Abdominal pain.  Epigastric pain.  CT ABDOMEN AND PELVIS WITH CONTRAST  Technique:  Multidetector CT imaging of the abdomen and pelvis was performed following the standard protocol during bolus administration of intravenous contrast.  Contrast: OMNIPAQUE IOHEXOL 300 MG/ML IV SOLN  Comparison: None.  Findings: Lung bases appear within normal limits.  Liver and gallbladder normal.  No calcified gallstones.  There is a mild focal dilation of the main pancreatic duct in the pancreatic head, measuring 6 mm.  Proximal and distal duct demonstrate normal caliber.  No mass lesion is identified.  The adrenal glands are normal.  Normal renal enhancement.  Spleen appears normal.  Proximal small bowel appears within normal limits. There is dilation of distal small bowel with adherent small bowel contents distally.  There is a focal transition point in the anatomic pelvis with decompressed ileum.  Findings compatible with mechanical small bowel obstruction.  This may be adhesive in a patient with history of prior abdominal surgery. Fecalized small bowel is present (image 38 series 5).  Hysterectomy.  Stool is present within the colon.  Normal appendix is present.  Abdominal vasculature appears within normal limits. Urinary bladder normal.  IMPRESSION: 1. Small bowel obstruction with transition  point in the right lower quadrant.  This probably represents adhesive small bowel obstruction in patient with history of prior abdominal surgery. 2.  Mild dilation of the main pancreatic duct in the pancreatic head. Follow-up MRI of the pancreas with without infusion and MRCP recommended. IPMN could produce this appearance.  Non-emergent MRI should be deferred until patient has been discharged for the acute illness, and can optimally cooperate with positioning and breath- holding instructions.  Original Report  Authenticated By: Andreas Newport, M.D.    CT of the abdomen and pelvis shows small bowel obstruction with transition point in the pelvis. Radiologist thinks this is due to adhesions from prior surgery. We'll consult Gen. surgery and arrange for admission for treatment of this.    1. Small bowel obstruction      4:20 PM Case discussed with Dr. Johna Sheriff via the OR nurse at [facility].  He requests pt be transferred to the [facility] ED, where he will see her.    4:37 PM Pt and her son want her to go to [facility].  Dr. Preston Fleeting will take the calls to arrange this.   Carleene Cooper III, MD 06/29/11 1431  Carleene Cooper III, MD 06/29/11 [phone]

## 2011-06-29 NOTE — ED Provider Notes (Signed)
I discussed the case with the surgeon on call at [facility] and with Dr. Johny Drilling,  hospitalist on-call at [facility]. Dr. Johny Drilling agrees to accept the patient and the surgeon states that he will see the patient in consult.  Dione Booze, MD 06/29/11 1655

## 2011-06-29 NOTE — ED Notes (Signed)
Report to Apolinar Junes, Charity fundraiser at [facility]

## 2011-06-29 NOTE — ED Notes (Signed)
abd pain for over a week. Urinary frequency. Nausea this am.

## 2011-06-30 LAB — URINE CULTURE

## 2011-10-26 ENCOUNTER — Emergency Department (HOSPITAL_BASED_OUTPATIENT_CLINIC_OR_DEPARTMENT_OTHER): Payer: Medicare Other

## 2011-10-26 ENCOUNTER — Emergency Department (HOSPITAL_BASED_OUTPATIENT_CLINIC_OR_DEPARTMENT_OTHER)
Admission: EM | Admit: 2011-10-26 | Discharge: 2011-10-26 | Disposition: A | Discharge status: Home / Self Care | Payer: Medicare Other | Attending: Emergency Medicine | Admitting: Emergency Medicine

## 2011-10-26 ENCOUNTER — Encounter (HOSPITAL_BASED_OUTPATIENT_CLINIC_OR_DEPARTMENT_OTHER): Payer: Self-pay | Admitting: Family Medicine

## 2011-10-26 DIAGNOSIS — M25569 Pain in unspecified knee: Secondary | ICD-10-CM | POA: Insufficient documentation

## 2011-10-26 DIAGNOSIS — R109 Unspecified abdominal pain: Secondary | ICD-10-CM | POA: Insufficient documentation

## 2011-10-26 DIAGNOSIS — M25519 Pain in unspecified shoulder: Secondary | ICD-10-CM | POA: Insufficient documentation

## 2011-10-26 DIAGNOSIS — W1809XA Striking against other object with subsequent fall, initial encounter: Secondary | ICD-10-CM | POA: Insufficient documentation

## 2011-10-26 DIAGNOSIS — W010XXA Fall on same level from slipping, tripping and stumbling without subsequent striking against object, initial encounter: Secondary | ICD-10-CM | POA: Insufficient documentation

## 2011-10-26 DIAGNOSIS — R609 Edema, unspecified: Secondary | ICD-10-CM | POA: Insufficient documentation

## 2011-10-26 DIAGNOSIS — T148XXA Other injury of unspecified body region, initial encounter: Secondary | ICD-10-CM

## 2011-10-26 DIAGNOSIS — T07XXXA Unspecified multiple injuries, initial encounter: Secondary | ICD-10-CM

## 2011-10-26 DIAGNOSIS — IMO0002 Reserved for concepts with insufficient information to code with codable children: Secondary | ICD-10-CM | POA: Insufficient documentation

## 2011-10-26 DIAGNOSIS — M47812 Spondylosis without myelopathy or radiculopathy, cervical region: Secondary | ICD-10-CM | POA: Insufficient documentation

## 2011-10-26 HISTORY — DX: Chronic obstructive pulmonary disease, unspecified: J44.9

## 2011-10-26 MED ORDER — ONDANSETRON 4 MG PO TBDP
ORAL_TABLET | ORAL | Status: AC
  Administered 2011-10-26: 4 mg
  Filled 2011-10-26: qty 1

## 2011-10-26 MED ORDER — HYDROCODONE-ACETAMINOPHEN 5-325 MG PO TABS: 2.0000 | ORAL_TABLET | ORAL | Status: DC | PRN

## 2011-10-26 MED ORDER — TETANUS-DIPHTH-ACELL PERTUSSIS 5-2.5-18.5 LF-MCG/0.5 IM SUSP
0.5000 mL | Freq: Once | INTRAMUSCULAR | Status: AC
  Administered 2011-10-26: 0.5 mL via INTRAMUSCULAR
  Filled 2011-10-26: qty 0.5

## 2011-10-26 MED ORDER — HYDROCODONE-ACETAMINOPHEN 5-325 MG PO TABS
2.0000 | ORAL_TABLET | Freq: Once | ORAL | Status: AC
  Administered 2011-10-26: 2 via ORAL
  Filled 2011-10-26: qty 2

## 2011-10-26 MED ORDER — ACETAMINOPHEN 500 MG PO TABS
ORAL_TABLET | ORAL | Status: AC
  Filled 2011-10-26: qty 2

## 2011-10-26 NOTE — ED Notes (Signed)
Pt returned from radiology.

## 2011-10-26 NOTE — Discharge Instructions (Signed)
Abrasions Abrasions are skin scrapes. Their treatment depends on how large and deep the abrasion is. Abrasions do not extend through all layers of the skin. A cut or lesion through all skin layers is called a laceration. HOME CARE INSTRUCTIONS   If you were given a dressing, change it at least once a day or as instructed by your caregiver. If the bandage sticks, soak it off with a solution of water or hydrogen peroxide.   Twice a day, wash the area with soap and water to remove all the cream/ointment. You may do this in a sink, under a tub faucet, or in a shower. Rinse off the soap and pat dry with a clean towel. Look for signs of infection (see below).   Reapply cream/ointment according to your caregiver's instruction. This will help prevent infection and keep the bandage from sticking. Telfa or gauze over the wound and under the dressing or wrap will also help keep the bandage from sticking.   If the bandage becomes wet, dirty, or develops a foul smell, change it as soon as possible.   Only take over-the-counter or prescription medicines for pain, discomfort, or fever as directed by your caregiver.  SEEK IMMEDIATE MEDICAL CARE IF:   Increasing pain in the wound.   Signs of infection develop: redness, swelling, surrounding area is tender to touch, or pus coming from the wound.   You have a fever.   Any foul smell coming from the wound or dressing.  Most skin wounds heal within ten days. Facial wounds heal faster. However, an infection may occur despite proper treatment. You should have the wound checked for signs of infection within 24 to 48 hours or sooner if problems arise. If you were not given a wound-check appointment, look closely at the wound yourself on the second day for early signs of infection listed above. MAKE SURE YOU:   Understand these instructions.   Will watch your condition.   Will get help right away if you are not doing well or get worse.  Document Released:  02/22/2005 Document Revised: 05/04/2011 Document Reviewed: 04/18/2011 [employer] Patient Information 2012 [employer].Contusion A contusion is a deep bruise. Contusions are the result of an injury that caused bleeding under the skin. The contusion may turn blue, purple, or yellow. Minor injuries will give you a painless contusion, but more severe contusions may stay painful and swollen for a few weeks.  CAUSES  A contusion is usually caused by a blow, trauma, or direct force to an area of the body. SYMPTOMS   Swelling and redness of the injured area.   Bruising of the injured area.   Tenderness and soreness of the injured area.   Pain.  DIAGNOSIS  The diagnosis can be made by taking a history and physical exam. An X-ray, CT scan, or MRI may be needed to determine if there were any associated injuries, such as fractures. TREATMENT  Specific treatment will depend on what area of the body was injured. In general, the best treatment for a contusion is resting, icing, elevating, and applying cold compresses to the injured area. Over-the-counter medicines may also be recommended for pain control. Ask your caregiver what the best treatment is for your contusion. HOME CARE INSTRUCTIONS   Put ice on the injured area.   Put ice in a plastic bag.   Place a towel between your skin and the bag.   Leave the ice on for 15 to 20 minutes, 3 to 4 times a day.     Only take over-the-counter or prescription medicines for pain, discomfort, or fever as directed by your caregiver. Your caregiver may recommend avoiding anti-inflammatory medicines (aspirin, ibuprofen, and naproxen) for 48 hours because these medicines may increase bruising.   Rest the injured area.   If possible, elevate the injured area to reduce swelling.  SEEK IMMEDIATE MEDICAL CARE IF:   You have increased bruising or swelling.   You have pain that is getting worse.   Your swelling or pain is not relieved with medicines.  MAKE  SURE YOU:   Understand these instructions.   Will watch your condition.   Will get help right away if you are not doing well or get worse.  Document Released: 02/22/2005 Document Revised: 05/04/2011 Document Reviewed: 03/20/2011 [facility] Patient Information 2012 [place], [place].Home Safety and Preventing Falls Falls are a leading cause of injury and while they affect all age groups, falls have greater short-term and long-term impact on older age groups. However, falls should not be a part of life or aging. It is possible for individuals and their families to use preventive measures to significantly decrease the likelihood that anyone, especially an older adult, will fall. There are many simple measures which can make your home safer with respect to preventing falls. The following actions can help reduce falls among all members of your family and are especially important as you age, when your balance, lower limb strength, coordination, and eyesight may be declining. The use of preventive measures will help to reduce you and your family's risk of falls and serious medical consequences. OUTDOORS  Repair cracks and edges of walkways and driveways.   Remove high doorway thresholds and trim shrubbery on the main path into your home.   Ensure there is good outside lighting at main entrances and along main walkways.   Clear walkways of tools, rocks, debris, and clutter.   Check that handrails are not broken and are securely fastened. Both sides of steps should have handrails.   In the garage, be attentive to and clean up grease or oil spills on the cement. This can make the surface extremely slippery.   In winter, have leaves, snow, and ice cleared regularly.   Use sand or salt on walkways during winter months.  BATHROOM  Install grab bars by the toilet and in the tub and shower.   Use non-skid mats or decals in the tub or shower.   If unable to easily stand unsupported while showering,  place a plastic non slip stool in the shower to sit on when needed.   Install night lights.   Keep floors dry and clean up all water on the floor immediately.   Remove soap buildup in tub or shower on a regular basis.   Secure bath mats with non-slip, double-sided rug tape.   Remove tripping hazards from the floors.  BEDROOMS  Install night lights.   Do not use oversized bedding.   Make sure a bedside light is easy to reach.   Keep a telephone by your bedside.   Make sure that you can get in and out of your bed easily.   Have a firm chair, with side arms, to use for getting dressed.   Remove clutter from around closets.   Store clothing, bed coverings, and other household items where you can reach them comfortably.   Remove tripping hazards from the floor.  LIVING AREAS AND STAIRWAYS  Turn on lights to avoid having to walk through dark areas.   Keep lighting  uniform in each room. Place brighter lightbulbs in darker areas, including stairways.   Replace lightbulbs that burn out in stairways immediately.   Arrange furniture to provide for clear pathways.   Keep furniture in the same place.   Eliminate or tape down electrical cables in high traffic areas.   Place handrails on both sides of stairways. Use handrails when going up or down stairs.   Most falls occur on the top or bottom 3 steps.   Fix any loose handrails. Make sure handrails on both sides of the stairways are as long as the stairs.   Remove all walkway obstacles.   Coil or tape electrical cords off to the side of walking areas and out of the way. If using many extension cords, have an electrician put in a new wall outlet to reduce or eliminate them.   Make sure spills are cleaned up quickly and allow time for drying before walking on freshly cleaned floors.   Firmly attach carpet with non-skid or two-sided tape.   Keep frequently used items within easy reach.   Remove tripping hazards such as  throw rugs and clutter in walkways. Never leave objects on stairs.   Get rid of throw rugs elsewhere if possible.   Eliminate uneven floor surfaces.   Make sure couches and chairs are easy to get into and out of.   Check carpeting to make sure it is firmly attached along stairs.   Make repairs to worn or loose carpet promptly.   Select a carpet pattern that does not visually hide the edge of steps.   Avoid placing throw rugs or scatter rugs at the top or bottom of stairways, or properly secure with carpet tape to prevent slippage.   Have an electrician put in a light switch at the top and bottom of the stairs.   Get light switches that glow.   Avoid the following practices: hurrying, inattention, obscured vision, carrying large loads, and wearing slip-on shoes.   Be aware of all pets.  KITCHEN  Place items that are used frequently, such as dishes and food, within easy reach.   Keep handles on pots and pans toward the center of the stove. Use back burners when possible.   Make sure spills are cleaned up quickly and allow time for drying.   Avoid walking on wet floors.   Avoid hot utensils and knives.   Position shelves so they are not too high or low.   Place commonly used objects within easy reach.   If necessary, use a sturdy step stool with a grab bar when reaching.   Make sure electrical cables are out of the way.   Do not use floor polish or wax that makes floors slippery.  OTHER HOME FALL PREVENTION STRATEGIES  Wear low heel or rubber sole shoes that are supportive and fit well.   Wear closed toe shoes.   Know and watch for side effects of medications. Have your caregiver or pharmacist look at all your medicines, even over-the-counter medicines. Some medicines can make you sleepy or dizzy.   Exercise regularly. Exercise makes you stronger and improves your balance and coordination.   Limit use of alcohol.   Use eyeglasses if necessary and keep them clean.  Have your vision checked every year.   Organize your household in a manner that minimizes the need to walk distances when hurried, or go up and down stairs unnecessarily. For example, have a phone placed on at least each floor of  your home. If possible, have a phone beside each sitting or lying area where you spend the most time at home. Keep emergency numbers posted at all phones.   Use non-skid floor wax.   When using a ladder, make sure:   The base is firm.   All ladder feet are on level ground.   The ladder is angled against the wall properly.   When climbing a ladder, face the ladder and hold the ladder rungs firmly.   If reaching, always keep your hips and body weight centered between the rails.   When using a stepladder, make sure it is fully opened and both spreaders are firmly locked.   Do not climb a closed stepladder.   Avoid climbing beyond the second step from the top of a stepladder and the 4th rung from the top of an extension ladder.   Learn and use mobility aids as needed.   Change positions slowly. Arise slowly from sitting and lying positions. Sit on the edge of your bed before getting to your feet.   If you have a history of falls, ask someone to add color or contrast paint or tape to grab bars and handrails in your home.   If you have a history of falls, ask someone to place contrasting color strips on first and last steps.   Install an electrical emergency response system if you need one, and know how to use it.   If you have a medical or other condition that causes you to have limited physical strength, it is important that you reach out to family and friends for occasional help.  FOR CHILDREN:  If young children are in the home, use safety gates. At the top of stairs use screw-mounted gates; use pressure-mounted gates for the bottom of the stairs and doorways between rooms.   Young children should be taught to descend stairs on their stomachs, feet  first, and later using the handrail.   Keep drawers fully closed to prevent them from being climbed on or pulled out entirely.   Move chairs, cribs, beds and other furniture away from windows.   Consider installing window guards on windows ground floor and up, unless they are emergency fire exits. Make sure they have easy release mechanisms.   Consider installing special locks that only allow the window to be opened to a certain height.   Never rely on window screens to prevent falls.   Never leave babies alone on changing tables, beds or sofas. Use a changing table that has a restraining strap.   When a child can pull to a standing position, the crib mattress should be adjusted to its lowest position. There should be at least 26 inches between the top rails of the crib drop side and the mattress. Toys, bumper pads, and other objects that can be used as steps to climb out should be removed from the crib.   On bunk beds never allow a child under age 65 to sleep on the top bunk. For older children, if the upper bunk is not against a wall, use guard rails on both sides. No matter how old a child is, keep the guard rails in place on the top bunk since children roll during sleep. Do not permit horseplay on bunks.   Grass and soil surfaces beneath backyard playground equipment should be replaced with hardwood chips, shredded wood mulch, sand, pea gravel, rubber, crushed stone, or another safer material at depths of at least 9 to 12  inches.   When riding bikes or using skates, skateboards, skis, or snowboards, require children to wear helmets. Look for those that have stickers stating that they meet or exceed safety standards.   Vertical posts or pickets in deck, balcony, and stairway railings should be no more than 3 1/2 inches apart if a young baby will have access to the area. The space between horizontal rails or bars, and between the floor and the first horizontal rail or bar, should be no more  than 3 1/2 inches.  Document Released: 05/05/2002 Document Revised: 05/04/2011 Document Reviewed: 03/04/2009 [facility] Patient Information 2012 [place], [place].

## 2011-10-26 NOTE — ED Notes (Signed)
Patient transported to X-ray via stretcher 

## 2011-10-26 NOTE — ED Notes (Signed)
Pt sts she tripped and fell on sidewalk today. Pt denies loc, has hematoma to left forehead. Pt denies neck and back pain. Pt sts she was seen by PCP today. Pt has abrasion to right knee and left elbow.

## 2011-10-26 NOTE — ED Notes (Signed)
Report received from Vickie Henson, RN, care assumed. 

## 2011-10-26 NOTE — ED Provider Notes (Signed)
History     CSN: [id number]  Arrival date & time 10/26/11  1241   First MD Initiated Contact with Patient 10/26/11 1259      Chief Complaint  Patient presents with  . Fall    (Consider location/radiation/quality/duration/timing/severity/associated sxs/prior treatment) Patient is a 66 y.o. female presenting with fall. The history is provided by the patient. No language interpreter was used.  Fall The accident occurred 1 to 2 hours ago. The fall occurred while walking. She fell from a height of 3 to 5 ft. She landed on concrete. The volume of blood lost was minimal. The point of impact was the head, left shoulder and left elbow. The pain is present in the left elbow, neck, right knee and left shoulder. The pain is at a severity of 5/10. The pain is moderate. She was ambulatory at the scene. There was no entrapment after the fall. Associated symptoms include abdominal pain. Pertinent negatives include no visual change, no vomiting and no hearing loss. The symptoms are aggravated by use of the injured limb. She has tried ice for the symptoms. The treatment provided no relief.  Pt reports she fell and hit concrete.  No loc.    Past Medical History  Diagnosis Date  . Depression   . Diabetes mellitus   . Thyroid disease   . COPD (chronic obstructive pulmonary disease)     Past Surgical History  Procedure Date  . Abdominal hysterectomy     No family history on file.  History  Substance Use Topics  . Smoking status: Current Everyday Smoker -- 0.5 packs/day  . Smokeless tobacco: Not on file  . Alcohol Use: No    OB History    Grav Para Term Preterm Abortions TAB SAB Ect Mult Living                  Review of Systems  Gastrointestinal: Positive for abdominal pain. Negative for vomiting.  Musculoskeletal: Positive for joint swelling.  Skin: Positive for wound.  All other systems reviewed and are negative.    Allergies  Review of patient's allergies indicates no known  allergies.  Home Medications   Current Outpatient Rx  Name Route Sig Dispense Refill  . XANAX PO Oral Take by mouth.    . WELLBUTRIN PO Oral Take by mouth.    . METFORMIN HCL PO Oral Take by mouth.    . TRAZODONE HCL PO Oral Take by mouth.    . BUSPIRONE HCL 7.5 MG PO TABS Oral Take 7.5 mg by mouth 2 (two) times daily.      Marland Kitchen CLINDAMYCIN HCL 300 MG PO CAPS Oral Take 300 mg by mouth 3 (three) times daily.      . CYMBALTA PO Oral Take by mouth.      . AMBIEN PO Oral Take by mouth.        BP 137/79  Pulse 70  Temp(Src) 98.6 F (37 C) (Oral)  Resp 22  Ht 5\' 3"  (1.6 m)  Wt 126 lb (57.153 kg)  BMI 22.32 kg/m2  SpO2 96%  Physical Exam  Nursing note and vitals reviewed. Constitutional: She is oriented to person, place, and time. She appears well-developed and well-nourished.  HENT:  Head: Normocephalic and atraumatic.  Eyes: Pupils are equal, round, and reactive to light.  Neck: Normal range of motion.  Cardiovascular: Normal rate.   Pulmonary/Chest: Effort normal.  Abdominal: Soft.  Musculoskeletal: She exhibits edema and tenderness.       Abrasion left forehead,  Abrasions bilat knees,  Abrasion left elbow,   c spine diffusely tender,  Tender left shoulder  Neurological: She is alert and oriented to person, place, and time. She has normal reflexes.  Skin: Skin is warm.  Psychiatric: She has a normal mood and affect.    ED Course  Procedures (including critical care time)  Labs Reviewed - No data to display Dg Cervical Spine Complete  10/26/2011  *RADIOLOGY REPORT*  Clinical Data: Fall with multiple injuries.  CERVICAL SPINE - COMPLETE 4+ VIEW  Comparison: None.  Findings: No evidence of acute fracture or subluxation.  There are moderate degenerative changes at C3-4, C4-5 and C5-6.  Oblique views show probable component of bony foraminal stenosis on the left at C3-4 and C4-5.  No soft tissue swelling identified.  IMPRESSION: No acute fracture identified.  Multilevel cervical  spondylosis present.  Original Report Authenticated By: Reola Calkins, M.D.   Dg Elbow Complete Left  10/26/2011  *RADIOLOGY REPORT*  Clinical Data: Fall with left elbow laceration and pain.  LEFT ELBOW - COMPLETE 3+ VIEW  Comparison: None.  Findings: No evidence of acute fracture, dislocation or joint effusion.  Soft tissues are unremarkable.  No foreign body identified.  IMPRESSION: No acute injuries identified.  Original Report Authenticated By: Reola Calkins, M.D.   Dg Shoulder Left  10/26/2011  *RADIOLOGY REPORT*  Clinical Data: Fall with left shoulder pain.  LEFT SHOULDER - 2+ VIEW  Comparison: None.  Findings: No acute fracture or dislocation identified. Degenerative changes are present at the [facility] joint.  No evidence of AC joint injury or clavicular fracture.  Soft tissues are unremarkable.  IMPRESSION: No acute findings.  Original Report Authenticated By: Reola Calkins, M.D.   Dg Knee Complete 4 Views Right  10/26/2011  *RADIOLOGY REPORT*  Clinical Data: Fall with abrasion and right knee pain.  RIGHT KNEE - COMPLETE 4+ VIEW  Comparison: None.  Findings: No acute fracture or dislocation identified.  There does appear to be soft tissue swelling predominately along the medial aspect of the upper knee.  Ossified loose body just superior and posterior to the upper patella may be in the joint space.  No focal bony lesions.  IMPRESSION: No acute fracture.  Ossified loose body adjacent to the patella may be in the suprapatellar joint space.  Original Report Authenticated By: Reola Calkins, M.D.     No diagnosis found.    MDM   Results for orders placed during the hospital encounter of 06/29/11  URINALYSIS, ROUTINE W REFLEX MICROSCOPIC      Component Value Range   Color, Urine YELLOW  YELLOW    APPearance CLEAR  CLEAR    Specific Gravity, Urine 1.004 (*) 1.005 - 1.030    pH 6.0  5.0 - 8.0    Glucose, UA NEGATIVE  NEGATIVE (mg/dL)   Hgb urine dipstick NEGATIVE  NEGATIVE     Bilirubin Urine NEGATIVE  NEGATIVE    Ketones, ur NEGATIVE  NEGATIVE (mg/dL)   Protein, ur NEGATIVE  NEGATIVE (mg/dL)   Urobilinogen, UA 0.2  0.0 - 1.0 (mg/dL)   Nitrite NEGATIVE  NEGATIVE    Leukocytes, UA NEGATIVE  NEGATIVE   CBC      Component Value Range   WBC 10.3  4.0 - 10.5 (K/uL)   RBC 4.35  3.87 - 5.11 (MIL/uL)   Hemoglobin 13.4  12.0 - 15.0 (g/dL)   HCT 16.1  09.6 - 04.5 (%)   MCV 90.3  78.0 - 100.0 (fL)  MCH 30.8  26.0 - 34.0 (pg)   MCHC 34.1  30.0 - 36.0 (g/dL)   RDW 16.1  09.6 - 04.5 (%)   Platelets 357  150 - 400 (K/uL)  DIFFERENTIAL      Component Value Range   Neutrophils Relative 47  43 - 77 (%)   Neutro Abs 4.8  1.7 - 7.7 (K/uL)   Lymphocytes Relative 41  12 - 46 (%)   Lymphs Abs 4.2 (*) 0.7 - 4.0 (K/uL)   Monocytes Relative 10  3 - 12 (%)   Monocytes Absolute 1.0  0.1 - 1.0 (K/uL)   Eosinophils Relative 3  0 - 5 (%)   Eosinophils Absolute 0.3  0.0 - 0.7 (K/uL)   Basophils Relative 0  0 - 1 (%)   Basophils Absolute 0.0  0.0 - 0.1 (K/uL)  COMPREHENSIVE METABOLIC PANEL      Component Value Range   Sodium 140  135 - 145 (mEq/L)   Potassium 4.5  3.5 - 5.1 (mEq/L)   Chloride 102  96 - 112 (mEq/L)   CO2 29  19 - 32 (mEq/L)   Glucose, Bld 129 (*) 70 - 99 (mg/dL)   BUN 13  6 - 23 (mg/dL)   Creatinine, Ser 4.09  0.50 - 1.10 (mg/dL)   Calcium 9.7  8.4 - 81.1 (mg/dL)   Total Protein 7.0  6.0 - 8.3 (g/dL)   Albumin 3.6  3.5 - 5.2 (g/dL)   AST 17  0 - 37 (U/L)   ALT 12  0 - 35 (U/L)   Alkaline Phosphatase 101  39 - 117 (U/L)   Total Bilirubin 0.1 (*) 0.3 - 1.2 (mg/dL)   GFR calc non Af Amer 77 (*) >90 (mL/min)   GFR calc Af Amer 89 (*) >90 (mL/min)  URINALYSIS, ROUTINE W REFLEX MICROSCOPIC      Component Value Range   Color, Urine YELLOW  YELLOW    APPearance CLEAR  CLEAR    Specific Gravity, Urine 1.004 (*) 1.005 - 1.030    pH 6.0  5.0 - 8.0    Glucose, UA NEGATIVE  NEGATIVE (mg/dL)   Hgb urine dipstick NEGATIVE  NEGATIVE    Bilirubin Urine NEGATIVE   NEGATIVE    Ketones, ur NEGATIVE  NEGATIVE (mg/dL)   Protein, ur NEGATIVE  NEGATIVE (mg/dL)   Urobilinogen, UA 0.2  0.0 - 1.0 (mg/dL)   Nitrite NEGATIVE  NEGATIVE    Leukocytes, UA NEGATIVE  NEGATIVE   URINE CULTURE      Component Value Range   Specimen Description URINE, CLEAN CATCH     Special Requests NONE     Culture  Setup Time [id number]     Colony Count NO GROWTH     Culture NO GROWTH     Report Status 06/30/2011 FINAL    LIPASE, BLOOD      Component Value Range   Lipase 50  11 - 59 (U/L)  TROPONIN I      Component Value Range   Troponin I <0.30  <0.30 (ng/mL)  GLUCOSE, CAPILLARY      Component Value Range   Glucose-Capillary 91  70 - 99 (mg/dL)   Comment 1 Documented in Chart     Comment 2 Notify RN     Dg Cervical Spine Complete  10/26/2011  *RADIOLOGY REPORT*  Clinical Data: Fall with multiple injuries.  CERVICAL SPINE - COMPLETE 4+ VIEW  Comparison: None.  Findings: No evidence of acute fracture or subluxation.  There are moderate  degenerative changes at C3-4, C4-5 and C5-6.  Oblique views show probable component of bony foraminal stenosis on the left at C3-4 and C4-5.  No soft tissue swelling identified.  IMPRESSION: No acute fracture identified.  Multilevel cervical spondylosis present.  Original Report Authenticated By: Reola Calkins, M.D.   Dg Elbow Complete Left  10/26/2011  *RADIOLOGY REPORT*  Clinical Data: Fall with left elbow laceration and pain.  LEFT ELBOW - COMPLETE 3+ VIEW  Comparison: None.  Findings: No evidence of acute fracture, dislocation or joint effusion.  Soft tissues are unremarkable.  No foreign body identified.  IMPRESSION: No acute injuries identified.  Original Report Authenticated By: Reola Calkins, M.D.   Dg Shoulder Left  10/26/2011  *RADIOLOGY REPORT*  Clinical Data: Fall with left shoulder pain.  LEFT SHOULDER - 2+ VIEW  Comparison: None.  Findings: No acute fracture or dislocation identified. Degenerative changes are present at  the [facility] joint.  No evidence of AC joint injury or clavicular fracture.  Soft tissues are unremarkable.  IMPRESSION: No acute findings.  Original Report Authenticated By: Reola Calkins, M.D.   Dg Knee Complete 4 Views Right  10/26/2011  *RADIOLOGY REPORT*  Clinical Data: Fall with abrasion and right knee pain.  RIGHT KNEE - COMPLETE 4+ VIEW  Comparison: None.  Findings: No acute fracture or dislocation identified.  There does appear to be soft tissue swelling predominately along the medial aspect of the upper knee.  Ossified loose body just superior and posterior to the upper patella may be in the joint space.  No focal bony lesions.  IMPRESSION: No acute fracture.  Ossified loose body adjacent to the patella may be in the suprapatellar joint space.  Original Report Authenticated By: Reola Calkins, M.D.        Pt given rx for hydrocodone.   Pt advised to follow up with her MD for recheck in 1 week   Lonia Skinner [place], [place] 10/26/11 1510

## 2011-10-27 NOTE — ED Provider Notes (Signed)
Medical screening examination/treatment/procedure(s) were performed by non-physician practitioner and as supervising physician I was immediately available for consultation/collaboration.    Nelia Shi, MD 10/27/11 [phone]

## 2012-05-17 ENCOUNTER — Other Ambulatory Visit (HOSPITAL_BASED_OUTPATIENT_CLINIC_OR_DEPARTMENT_OTHER): Payer: Self-pay | Admitting: Family Medicine

## 2012-05-17 DIAGNOSIS — M25569 Pain in unspecified knee: Secondary | ICD-10-CM

## 2012-05-17 DIAGNOSIS — R609 Edema, unspecified: Secondary | ICD-10-CM

## 2012-05-18 ENCOUNTER — Ambulatory Visit (HOSPITAL_BASED_OUTPATIENT_CLINIC_OR_DEPARTMENT_OTHER)
Admission: RE | Admit: 2012-05-18 | Discharge: 2012-05-18 | Disposition: A | Discharge status: Home / Self Care | Payer: Medicare Other | Source: Ambulatory Visit | Attending: Family Medicine | Admitting: Family Medicine

## 2012-05-18 DIAGNOSIS — M25469 Effusion, unspecified knee: Secondary | ICD-10-CM | POA: Insufficient documentation

## 2012-05-18 DIAGNOSIS — R609 Edema, unspecified: Secondary | ICD-10-CM | POA: Insufficient documentation

## 2012-05-18 DIAGNOSIS — M25569 Pain in unspecified knee: Secondary | ICD-10-CM

## 2012-05-18 DIAGNOSIS — M1710 Unilateral primary osteoarthritis, unspecified knee: Secondary | ICD-10-CM | POA: Insufficient documentation

## 2012-05-18 DIAGNOSIS — IMO0002 Reserved for concepts with insufficient information to code with codable children: Secondary | ICD-10-CM | POA: Insufficient documentation

## 2012-07-02 ENCOUNTER — Other Ambulatory Visit (HOSPITAL_BASED_OUTPATIENT_CLINIC_OR_DEPARTMENT_OTHER): Payer: Self-pay | Admitting: Specialist

## 2012-07-09 ENCOUNTER — Encounter (HOSPITAL_BASED_OUTPATIENT_CLINIC_OR_DEPARTMENT_OTHER): Payer: Self-pay | Admitting: *Deleted

## 2012-07-09 NOTE — Pre-Procedure Instructions (Addendum)
History of sleep apnea and use of O2 at night discussed with Dr. Sampson Goon; need copy of sleep study, and have pt. come prepared to spend the night.  Pt. notified to bring overnight bag for RCC in case of stay.  Sleep study req. from [facility], [phone].

## 2012-07-16 ENCOUNTER — Ambulatory Visit (HOSPITAL_BASED_OUTPATIENT_CLINIC_OR_DEPARTMENT_OTHER): Payer: Medicare Other | Admitting: Anesthesiology

## 2012-07-16 ENCOUNTER — Encounter (HOSPITAL_BASED_OUTPATIENT_CLINIC_OR_DEPARTMENT_OTHER): Payer: Self-pay | Admitting: Anesthesiology

## 2012-07-16 ENCOUNTER — Encounter (HOSPITAL_BASED_OUTPATIENT_CLINIC_OR_DEPARTMENT_OTHER)
Admission: RE | Disposition: A | Discharge status: Home / Self Care | Payer: Self-pay | Source: Ambulatory Visit | Attending: Specialist

## 2012-07-16 ENCOUNTER — Ambulatory Visit (HOSPITAL_BASED_OUTPATIENT_CLINIC_OR_DEPARTMENT_OTHER)
Admission: RE | Admit: 2012-07-16 | Discharge: 2012-07-16 | Disposition: A | Discharge status: Home / Self Care | Payer: Medicare Other | Source: Ambulatory Visit | Attending: Specialist | Admitting: Specialist

## 2012-07-16 DIAGNOSIS — F17200 Nicotine dependence, unspecified, uncomplicated: Secondary | ICD-10-CM | POA: Insufficient documentation

## 2012-07-16 DIAGNOSIS — K219 Gastro-esophageal reflux disease without esophagitis: Secondary | ICD-10-CM | POA: Insufficient documentation

## 2012-07-16 DIAGNOSIS — G4730 Sleep apnea, unspecified: Secondary | ICD-10-CM | POA: Insufficient documentation

## 2012-07-16 DIAGNOSIS — E039 Hypothyroidism, unspecified: Secondary | ICD-10-CM | POA: Insufficient documentation

## 2012-07-16 DIAGNOSIS — J4489 Other specified chronic obstructive pulmonary disease: Secondary | ICD-10-CM | POA: Insufficient documentation

## 2012-07-16 DIAGNOSIS — Z79899 Other long term (current) drug therapy: Secondary | ICD-10-CM | POA: Insufficient documentation

## 2012-07-16 DIAGNOSIS — M2341 Loose body in knee, right knee: Secondary | ICD-10-CM | POA: Diagnosis present

## 2012-07-16 DIAGNOSIS — J449 Chronic obstructive pulmonary disease, unspecified: Secondary | ICD-10-CM | POA: Insufficient documentation

## 2012-07-16 DIAGNOSIS — M2340 Loose body in knee, unspecified knee: Secondary | ICD-10-CM | POA: Insufficient documentation

## 2012-07-16 HISTORY — DX: Enthesopathy, unspecified: M77.9

## 2012-07-16 HISTORY — PX: KNEE ARTHROSCOPY: SHX127

## 2012-07-16 HISTORY — DX: Sleep apnea, unspecified: G47.30

## 2012-07-16 HISTORY — DX: Anxiety disorder, unspecified: F41.9

## 2012-07-16 HISTORY — DX: Gastro-esophageal reflux disease without esophagitis: K21.9

## 2012-07-16 HISTORY — DX: Hypothyroidism, unspecified: E03.9

## 2012-07-16 SURGERY — ARTHROSCOPY, KNEE
Anesthesia: General | Site: Knee | Laterality: Right | Wound class: Clean

## 2012-07-16 MED ORDER — ONDANSETRON HCL 4 MG/2ML IJ SOLN: 4.0000 mg | Freq: Once | INTRAMUSCULAR | Status: DC | PRN

## 2012-07-16 MED ORDER — OXYCODONE-ACETAMINOPHEN 5-325 MG PO TABS: 1.0000 | ORAL_TABLET | ORAL | Status: DC | PRN

## 2012-07-16 MED ORDER — OXYCODONE HCL 5 MG/5ML PO SOLN: 5.0000 mg | Freq: Once | ORAL | Status: AC | PRN

## 2012-07-16 MED ORDER — DEXAMETHASONE SODIUM PHOSPHATE 4 MG/ML IJ SOLN
INTRAMUSCULAR | Status: DC | PRN
  Administered 2012-07-16: 10 mg via INTRAVENOUS

## 2012-07-16 MED ORDER — CEFAZOLIN SODIUM-DEXTROSE 2-3 GM-% IV SOLR
2.0000 g | INTRAVENOUS | Status: AC
  Administered 2012-07-16: 2 g via INTRAVENOUS

## 2012-07-16 MED ORDER — PROPOFOL 10 MG/ML IV BOLUS
INTRAVENOUS | Status: DC | PRN
  Administered 2012-07-16: 150 mg via INTRAVENOUS

## 2012-07-16 MED ORDER — METOCLOPRAMIDE HCL 5 MG PO TABS: 5.0000 mg | ORAL_TABLET | Freq: Three times a day (TID) | ORAL | Status: DC | PRN

## 2012-07-16 MED ORDER — METOCLOPRAMIDE HCL 5 MG/ML IJ SOLN: 5.0000 mg | Freq: Three times a day (TID) | INTRAMUSCULAR | Status: DC | PRN

## 2012-07-16 MED ORDER — MIDAZOLAM HCL 2 MG/ML PO SYRP: 12.0000 mg | ORAL_SOLUTION | Freq: Once | ORAL | Status: DC | PRN

## 2012-07-16 MED ORDER — HYDROMORPHONE HCL PF 1 MG/ML IJ SOLN
0.2500 mg | INTRAMUSCULAR | Status: DC | PRN
  Administered 2012-07-16: 0.5 mg via INTRAVENOUS

## 2012-07-16 MED ORDER — LACTATED RINGERS IV SOLN
INTRAVENOUS | Status: DC
  Administered 2012-07-16 (×2): via INTRAVENOUS

## 2012-07-16 MED ORDER — MIDAZOLAM HCL 5 MG/5ML IJ SOLN
INTRAMUSCULAR | Status: DC | PRN
  Administered 2012-07-16: 1 mg via INTRAVENOUS

## 2012-07-16 MED ORDER — DOCUSATE SODIUM 100 MG PO CAPS: 100.0000 mg | ORAL_CAPSULE | Freq: Two times a day (BID) | ORAL | Status: DC

## 2012-07-16 MED ORDER — POLYETHYLENE GLYCOL 3350 17 G PO PACK: 17.0000 g | PACK | Freq: Every day | ORAL | Status: DC | PRN

## 2012-07-16 MED ORDER — SORBITOL 70 % SOLN: 30.0000 mL | Freq: Every day | Status: DC | PRN

## 2012-07-16 MED ORDER — KCL IN DEXTROSE-NACL 20-5-0.45 MEQ/L-%-% IV SOLN: INTRAVENOUS | Status: DC

## 2012-07-16 MED ORDER — ONDANSETRON HCL 4 MG PO TABS: 4.0000 mg | ORAL_TABLET | Freq: Four times a day (QID) | ORAL | Status: DC | PRN

## 2012-07-16 MED ORDER — LIDOCAINE HCL (CARDIAC) 20 MG/ML IV SOLN
INTRAVENOUS | Status: DC | PRN
  Administered 2012-07-16: 100 mg via INTRAVENOUS

## 2012-07-16 MED ORDER — HYDROCODONE-ACETAMINOPHEN 7.5-325 MG PO TABS: 1.0000 | ORAL_TABLET | ORAL | Status: DC | PRN

## 2012-07-16 MED ORDER — FENTANYL CITRATE 0.05 MG/ML IJ SOLN
INTRAMUSCULAR | Status: DC | PRN
  Administered 2012-07-16 (×2): 50 ug via INTRAVENOUS

## 2012-07-16 MED ORDER — MORPHINE SULFATE 2 MG/ML IJ SOLN: 1.0000 mg | INTRAMUSCULAR | Status: DC | PRN

## 2012-07-16 MED ORDER — SODIUM CHLORIDE 0.9 % IR SOLN
Status: DC | PRN
  Administered 2012-07-16: 3000 mL

## 2012-07-16 MED ORDER — ONDANSETRON HCL 4 MG/2ML IJ SOLN: 4.0000 mg | Freq: Four times a day (QID) | INTRAMUSCULAR | Status: DC | PRN

## 2012-07-16 MED ORDER — EPHEDRINE SULFATE 50 MG/ML IJ SOLN
INTRAMUSCULAR | Status: DC | PRN
  Administered 2012-07-16 (×2): 10 mg via INTRAVENOUS

## 2012-07-16 MED ORDER — MIDAZOLAM HCL 2 MG/2ML IJ SOLN: 1.0000 mg | INTRAMUSCULAR | Status: DC | PRN

## 2012-07-16 MED ORDER — LACTATED RINGERS IV SOLN: INTRAVENOUS | Status: DC

## 2012-07-16 MED ORDER — FLEET ENEMA 7-19 GM/118ML RE ENEM: 1.0000 | ENEMA | Freq: Once | RECTAL | Status: DC | PRN

## 2012-07-16 MED ORDER — OXYCODONE HCL 5 MG PO TABS
5.0000 mg | ORAL_TABLET | Freq: Once | ORAL | Status: AC | PRN
  Administered 2012-07-16: 5 mg via ORAL

## 2012-07-16 MED ORDER — FENTANYL CITRATE 0.05 MG/ML IJ SOLN: 50.0000 ug | INTRAMUSCULAR | Status: DC | PRN

## 2012-07-16 MED ORDER — ONDANSETRON HCL 4 MG/2ML IJ SOLN
INTRAMUSCULAR | Status: DC | PRN
  Administered 2012-07-16: 4 mg via INTRAVENOUS

## 2012-07-16 MED ORDER — BUPIVACAINE-EPINEPHRINE PF 0.25-1:200000 % IJ SOLN
INTRAMUSCULAR | Status: DC | PRN
  Administered 2012-07-16: 25 mL

## 2012-07-16 SURGICAL SUPPLY — 54 items
APL SKNCLS STERI-STRIP NONHPOA (GAUZE/BANDAGES/DRESSINGS) ×1
BANDAGE ELASTIC 4 VELCRO ST LF (GAUZE/BANDAGES/DRESSINGS) ×2 IMPLANT
BANDAGE ELASTIC 6 VELCRO ST LF (GAUZE/BANDAGES/DRESSINGS) ×2 IMPLANT
BANDAGE ESMARK 6X9 LF (GAUZE/BANDAGES/DRESSINGS) IMPLANT
BANDAGE GAUZE ELAST BULKY 4 IN (GAUZE/BANDAGES/DRESSINGS) ×2 IMPLANT
BENZOIN TINCTURE PRP APPL 2/3 (GAUZE/BANDAGES/DRESSINGS) ×2 IMPLANT
BLADE 4.2CUDA (BLADE) ×2 IMPLANT
BLADE CUDA SHAVER 3.5 (BLADE) IMPLANT
BLADE CUTTER GATOR 3.5 (BLADE) IMPLANT
BLADE GREAT WHITE 4.2 (BLADE) IMPLANT
BNDG CMPR 9X6 STRL LF SNTH (GAUZE/BANDAGES/DRESSINGS)
BNDG ESMARK 6X9 LF (GAUZE/BANDAGES/DRESSINGS)
BUR OVAL 6.0 (BURR) IMPLANT
CANISTER OMNI JUG 16 LITER (MISCELLANEOUS) ×2 IMPLANT
CANISTER SUCTION 2500CC (MISCELLANEOUS) IMPLANT
CANNULA CANNULOC THREADED (CANNULA) IMPLANT
CLOTH BEACON ORANGE TIMEOUT ST (SAFETY) ×2 IMPLANT
DRAPE ARTHROSCOPY W/POUCH 114 (DRAPES) ×2 IMPLANT
DRSG EMULSION OIL 3X3 NADH (GAUZE/BANDAGES/DRESSINGS) IMPLANT
DRSG PAD ABDOMINAL 8X10 ST (GAUZE/BANDAGES/DRESSINGS) ×2 IMPLANT
DURAPREP 26ML APPLICATOR (WOUND CARE) ×2 IMPLANT
ELECT MENISCUS 165MM 90D (ELECTRODE) IMPLANT
ELECT REM PT RETURN 9FT ADLT (ELECTROSURGICAL) ×2
ELECTRODE ANGLED 90DEG 4MM (MISCELLANEOUS) IMPLANT
ELECTRODE REM PT RTRN 9FT ADLT (ELECTROSURGICAL) ×1 IMPLANT
GAUZE SPONGE 4X4 16PLY XRAY LF (GAUZE/BANDAGES/DRESSINGS) IMPLANT
GLOVE BIO SURGEON STRL SZ 6.5 (GLOVE) ×2 IMPLANT
GLOVE BIO SURGEON STRL SZ7 (GLOVE) ×2 IMPLANT
GLOVE BIOGEL PI IND STRL 7.5 (GLOVE) ×1 IMPLANT
GLOVE BIOGEL PI INDICATOR 7.5 (GLOVE) ×1
GLOVE ECLIPSE 8.5 STRL (GLOVE) ×2 IMPLANT
GLOVE EXAM NITRILE EXT CUFF MD (GLOVE) ×1 IMPLANT
GLOVE INDICATOR 7.0 STRL GRN (GLOVE) ×2 IMPLANT
GLOVE INDICATOR 8.5 STRL (GLOVE) ×2 IMPLANT
GOWN PREVENTION PLUS XLARGE (GOWN DISPOSABLE) ×3 IMPLANT
GOWN PREVENTION PLUS XXLARGE (GOWN DISPOSABLE) ×1 IMPLANT
GOWN STRL REIN 3XL LVL4 (GOWN DISPOSABLE) ×1 IMPLANT
KNEE WRAP E Z 3 GEL PACK (MISCELLANEOUS) ×2 IMPLANT
PACK ARTHROSCOPY DSU (CUSTOM PROCEDURE TRAY) ×2 IMPLANT
PACK BASIN DAY SURGERY FS (CUSTOM PROCEDURE TRAY) ×2 IMPLANT
PAD ARMBOARD 7.5X6 YLW CONV (MISCELLANEOUS) ×2 IMPLANT
PADDING CAST COTTON 6X4 STRL (CAST SUPPLIES) ×2 IMPLANT
PENCIL BUTTON HOLSTER BLD 10FT (ELECTRODE) IMPLANT
RESECTOR FULL RADIUS 4.2MM (BLADE) IMPLANT
SET ARTHROSCOPY TUBING (MISCELLANEOUS) ×2
SET ARTHROSCOPY TUBING LN (MISCELLANEOUS) ×1 IMPLANT
SHEET MEDIUM DRAPE 40X70 STRL (DRAPES) ×2 IMPLANT
SPONGE GAUZE 4X4 12PLY (GAUZE/BANDAGES/DRESSINGS) ×2 IMPLANT
STRIP CLOSURE SKIN 1/2X4 (GAUZE/BANDAGES/DRESSINGS) ×1 IMPLANT
SUT ETHILON 4 0 PS 2 18 (SUTURE) IMPLANT
SUT VIC AB 3-0 FS2 27 (SUTURE) IMPLANT
SYR 50ML LL SCALE MARK (SYRINGE) ×2 IMPLANT
TOWEL OR 17X24 6PK STRL BLUE (TOWEL DISPOSABLE) ×2 IMPLANT
WATER STERILE IRR 1000ML POUR (IV SOLUTION) ×2 IMPLANT

## 2012-07-16 NOTE — Op Note (Signed)
07/16/2012  9:27 AM  PATIENT:  Patricia Sutton  67 y.o. female  MRN: [id number]  OPERATIVE REPORT  PRE-OPERATIVE DIAGNOSIS:  Right knee loose body  POST-OPERATIVE DIAGNOSIS:  Right knee loose body anteromedial intercondylar notch tibia.  PROCEDURE:  Procedure(s): ARTHROSCOPY KNEE, excison of exostosis anteromedial right knee intercondylar notch, chondroplastic Shaving of the patellofemoral joint.    SURGEON:  Kerrin Champagne, MD     ANESTHESIA:  Bella Kennedy. Supplemented with local marcaine 1/4% with epi 25 cc.    COMPLICATIONS:  None.   TOTAL TOURNIQUET TIME: 0 min.   PROCEDURE:The patient was met in the holding area, and the appropriate right  knee identified and marked with an "X" and my initials.  The patient was then transported to OR and was placed on the operative table in a supine position. The patient was then placed under general anesthesia without difficulty. The patient received appropriate preoperative antibiotic prophylaxis ancef. Tourniquet was applied to the operative thigh. Leg was then prepped using sterile conditions and draped using sterile technique. Time-out procedure was called and correct. The landmarks of the right lower extremity would then marked with a surgical marking pen. The areas for expected arthroscopic portals anterior inferior medial anterior inferior lateral were then infiltrated with Marcaine quarter percent with 1-200,000 epinephrine total of 10 cc. Stab incision over the anterior inferior lateral portal area after using a needle to demonstrate a line and entry point. Hemostat used to spread subcutaneous layers down to the lateral joint line. Inflow cannula and scope sleeve were then inserted through this anterior inferior lateral portal of the knee joint. The knee inflated with irrigation to 80mm hg.. Tourniquet was not used during this case. Examination of the medial compartment demonstrated grade 3 and grade 4 changes of the medial femoral  condyle with osteophyte off of the tibial joint margin there was no torn meniscus involving the medial meniscus. Denuded bone along the superior medial surface of the medial tibial plateau noted. Spinal needle inserted through the expected medial inferior anterior portal site and then an 11 blade scalpel used to incise skin hemostat then used to spread subcutaneous layers into the medial joint line with medial anterior inferior portal site. And inflow cannula was used to enlarge this opening. Probe then inserted examining the anteromedial intercondylar area, there a prominent medial exostosis was present and felt to represent the MRI finding of intra articular Loose body in this area. A 3.5 full radius shaver was then inserted and the anteromedial exostosis debrided back to normal appearing intercondylar anatomy. A ring currette was also used to debride the area of the exostosis. Margins carefully shaped and smoothed. Shaving was then performed of the lateral femoral condyle back to a more smooth.. The intercondylar notch examined demonstrated the chondral fragments within the ligamentum mucosum along the anterior medial aspect of the intercondylar notch this was debrided using a meniscal grabber. Shaving then performed a small area of attachment was fragment.  Patellofemoral joint examined. There was diffuse joint line grade 3 and 4 chondromalacia changes. Chondroplastic shaving was carried out of the patellofemoral joint both the trochlea groove of femur and the posterior patella surfaces to a smooth appearance. Irrigation was then carried out of the entire knee Joint and careful inspection showed no remaining loose bodies within the joint. Following this then all irrigation was carefully expressed from the knee joint and the medial and lateral portal sites closed with subcutaneous suture of 3-0 Vicryl and steristrips with tincture of benzion. Then  dried dressing of 4 x 4's ABDs pads fixed to the skin with  Kerlix Ace wrap applied left lower extremity. All instrument and sponge counts were correct. Patient then reactivated extubated and returned to recovery room in satisfactory condition A wrap around ice pack applied.  [name] E 07/16/2012, 9:27 AM

## 2012-07-16 NOTE — Anesthesia Procedure Notes (Signed)
Procedure Name: LMA Insertion Date/Time: 07/16/2012 7:56 AM Performed by: Caren Macadam Pre-anesthesia Checklist: Patient identified, Emergency Drugs available, Suction available and Patient being monitored Patient Re-evaluated:Patient Re-evaluated prior to inductionOxygen Delivery Method: Circle System Utilized Preoxygenation: Pre-oxygenation with 100% oxygen Intubation Type: IV induction Ventilation: Mask ventilation without difficulty LMA: LMA inserted LMA Size: 4.0 Number of attempts: 1 Airway Equipment and Method: bite block Placement Confirmation: positive ETCO2 and breath sounds checked- equal and bilateral Tube secured with: Tape Dental Injury: Teeth and Oropharynx as per pre-operative assessment

## 2012-07-16 NOTE — H&P (Addendum)
Patricia Sutton is an 67 y.o. female.   Chief Complaint: Right knee HPI: 67 year old female with 2 year history of right knee pain and swelling since fall 04/2010. At that time fractured the right shoulder and injured the right knee. Has history of DJD of the right knee but has been experiencing popping, Catching and occasional locking of the right knee with associated increased swelling since the fall. MRI 04/2012 shows Loose body over the anteromedial aspect of the right knee intercondylar notch and significant tricompartment osteoarthritis changes.  Past Medical History  Diagnosis Date  . Depression   . COPD (chronic obstructive pulmonary disease)     denies SOB  . Hypothyroidism   . GERD (gastroesophageal reflux disease)   . Anxiety   . Sleep apnea     no CPAP; uses O2 at night 1l/min.  . Full dentures   . Bone spur of other site     spine  . Loose body in knee 06/2012    right    Past Surgical History  Procedure Laterality Date  . Abdominal hysterectomy      complete    History reviewed. No pertinent family history. Social History:  reports that she has been smoking Cigarettes.  She has been smoking about 0.00 packs per day for the past 40 years. She has never used smokeless tobacco. She reports that she does not drink alcohol or use illicit drugs.  Allergies: No Known Allergies  Medications Prior to Admission  Medication Sig Dispense Refill  . ALPRAZolam (XANAX PO) Take 0.5 mg by mouth 2 (two) times daily.       . BuPROPion HCl (WELLBUTRIN PO) Take by mouth daily.       . DULoxetine HCl (CYMBALTA PO) Take 60 mg by mouth daily.       Marland Kitchen gabapentin (NEURONTIN) 600 MG tablet Take 600 mg by mouth 3 (three) times daily.      Marland Kitchen levothyroxine (SYNTHROID, LEVOTHROID) 75 MCG tablet Take 75 mcg by mouth daily.      Marland Kitchen omeprazole (PRILOSEC) 20 MG capsule Take 20 mg by mouth 2 (two) times daily.      Marland Kitchen oxyCODONE (ROXICODONE) 15 MG immediate release tablet Take 15 mg by mouth every 4  (four) hours as needed for pain.      . TRAZODONE HCL PO Take 100 mg by mouth at bedtime.       . Zolpidem Tartrate (AMBIEN PO) Take by mouth.          Results for orders placed during the hospital encounter of 07/16/12 (from the past 48 hour(s))  POCT HEMOGLOBIN-HEMACUE     Status: None   Collection Time    07/16/12  7:25 AM      Result Value Range   Hemoglobin 15.0  12.0 - 15.0 g/dL   No results found.  Review of Systems  Constitutional: Positive for weight loss. Negative for fever, chills, malaise/fatigue and diaphoresis.  HENT: Negative for hearing loss, ear pain, nosebleeds, congestion, sore throat, neck pain, tinnitus and ear discharge.   Eyes: Negative for blurred vision, double vision, photophobia, pain, discharge and redness.  Respiratory: Positive for wheezing. Negative for cough, hemoptysis, sputum production, shortness of breath and stridor.   Cardiovascular: Negative for chest pain, palpitations, orthopnea, claudication, leg swelling and PND.  Gastrointestinal: Positive for heartburn, nausea and diarrhea. Negative for vomiting, abdominal pain, constipation, blood in stool and melena.  Genitourinary: Negative for dysuria, urgency, frequency, hematuria and flank pain.  Musculoskeletal:  Positive for joint pain. Negative for myalgias, back pain and falls.  Skin: Negative for itching and rash.  Neurological: Negative for dizziness, tingling, tremors, sensory change, speech change, focal weakness, seizures, loss of consciousness, weakness and headaches.  Endo/Heme/Allergies: Negative for environmental allergies and polydipsia. Does not bruise/bleed easily.  Psychiatric/Behavioral: Positive for depression. Negative for suicidal ideas, hallucinations, memory loss and substance abuse. The patient is not nervous/anxious and does not have insomnia.     Blood pressure 137/67, pulse 60, temperature 98.3 F (36.8 C), temperature source Oral, resp. rate 20, height 5\' 3"  (1.6 m), weight  51.529 kg (113 lb 9.6 oz), SpO2 97.00%. Physical Exam  Constitutional: She is oriented to person, place, and time. She appears well-developed and well-nourished. No distress.  HENT:  Head: Normocephalic and atraumatic.  Right Ear: External ear normal.  Left Ear: External ear normal.  Nose: Nose normal.  Mouth/Throat: Oropharynx is clear and moist. No oropharyngeal exudate.  Eyes: Conjunctivae and EOM are normal. Pupils are equal, round, and reactive to light. Right eye exhibits no discharge. Left eye exhibits no discharge. No scleral icterus.  Neck: Normal range of motion. Neck supple. No JVD present. No tracheal deviation present. No thyromegaly present.  Cardiovascular: Normal rate, regular rhythm, normal heart sounds and intact distal pulses.  Exam reveals no gallop and no friction rub.   No murmur heard. Respiratory: Effort normal. No stridor. No respiratory distress. She has wheezes. She has no rales. She exhibits no tenderness.  GI: Soft. Bowel sounds are normal. She exhibits no distension and no mass. There is no tenderness. There is no rebound and no guarding.  Musculoskeletal: She exhibits tenderness. She exhibits no edema.  Lymphadenopathy:    She has no cervical adenopathy.  Neurological: She is alert and oriented to person, place, and time. She has normal reflexes. She displays normal reflexes. No cranial nerve deficit. She exhibits normal muscle tone. Coordination normal.  Skin: Skin is warm and dry. No rash noted. She is not diaphoretic. No erythema. No pallor.  Psychiatric: She has a normal mood and affect. Her behavior is normal. Judgment and thought content normal.   Right knee 2 plus effusion. ROM 0-130 degrees. Grating of the P-F Joint Tender medial joint line.MRI with large loose bocy right anteromedial intercondylar notch. Pulses are full both legs   Assessment/Plan Loose body right knee.  Plan: Right Knee arthroscopy with excision of loose body.  Patient was seen and  examined in the preop holding area. There has been no interval  Change in this patient's exam preop  history and physical exam  Lab tests and images have been examined and reviewed.  The Risks benefits and alternative treatments have been discussed  extensively,questions answered.  The patient has elected to undergo the discussed surgical treatment. Deondrea Aguado E 07/16/2012, 7:32 AM

## 2012-07-16 NOTE — Transfer of Care (Signed)
Immediate Anesthesia Transfer of Care Note  Patient: Patricia Sutton  Procedure(s) Performed: Procedure(s) with comments: ARTHROSCOPY KNEE (Right) - Right knee arthroscopy with chondroplastic shaving of retropatella surface  Patient Location: PACU  Anesthesia Type:General  Level of Consciousness: sedated  Airway & Oxygen Therapy: Patient Spontanous Breathing and Patient connected to face mask oxygen  Post-op Assessment: Report given to PACU RN and Post -op Vital signs reviewed and stable  Post vital signs: Reviewed and stable  Complications: No apparent anesthesia complications

## 2012-07-16 NOTE — Anesthesia Postprocedure Evaluation (Signed)
  Anesthesia Post-op Note  Patient: Patricia Sutton Comment  Procedure(s) Performed: Procedure(s) with comments: ARTHROSCOPY KNEE (Right) - Right knee arthroscopy with chondroplastic shaving of retropatella surface  Patient Location: PACU  Anesthesia Type:General  Level of Consciousness: awake, alert  and oriented  Airway and Oxygen Therapy: Patient Spontanous Breathing  Post-op Pain: mild  Post-op Assessment: Post-op Vital signs reviewed  Post-op Vital Signs: Reviewed  Complications: No apparent anesthesia complications

## 2012-07-16 NOTE — Brief Op Note (Signed)
07/16/2012  9:24 AM  PATIENT:  [name]  67 y.o. female  PRE-OPERATIVE DIAGNOSIS:  Right knee loose body  POST-OPERATIVE DIAGNOSIS:  Right knee loose body  PROCEDURE:  Procedure(s) with comments: ARTHROSCOPY KNEE (Right) - Right knee arthroscopy with chondroplastic shaving of retropatella surface  SURGEON:  Surgeon(s) and Role: Kerrin Champagne, MD - Primary  ANESTHESIA:  local, general Dr. Ivin Booty. EBL:  Total I/O In: 1000 [I.V.:1000] Out: -   BLOOD ADMINISTERED:none  DRAINS: none   LOCAL MEDICATIONS USED:  MARCAINE 1/4% with epi.    and Amount: 25 ml  SPECIMEN:  No Specimen  DISPOSITION OF SPECIMEN:  N/A  COUNTS:  YES  TOURNIQUET:    DICTATION: .Dragon Dictation  PLAN OF CARE: Discharge to home after PACU  PATIENT DISPOSITION:  PACU - hemodynamically stable.   Delay start of Pharmacological VTE agent (>24hrs) due to surgical blood loss or risk of bleeding: no

## 2012-07-16 NOTE — Anesthesia Preprocedure Evaluation (Addendum)
Anesthesia Evaluation  Patient identified by MRN, date of birth, ID band Patient awake    Reviewed: Allergy & Precautions, H&P , NPO status , Patient's Chart, lab work & pertinent test results  Airway Mallampati: I TM Distance: >3 FB Neck ROM: Full    Dental  (+) Edentulous Upper, Upper Dentures and Dental Advisory Given   Pulmonary sleep apnea and Oxygen sleep apnea , COPD COPD inhaler,  breath sounds clear to auscultation        Cardiovascular Rhythm:Regular Rate:Normal     Neuro/Psych    GI/Hepatic   Endo/Other    Renal/GU      Musculoskeletal   Abdominal   Peds  Hematology   Anesthesia Other Findings   Reproductive/Obstetrics                          Anesthesia Physical Anesthesia Plan  ASA: III  Anesthesia Plan: General   Post-op Pain Management:    Induction: Intravenous  Airway Management Planned: LMA  Additional Equipment:   Intra-op Plan:   Post-operative Plan: Extubation in OR  Informed Consent: I have reviewed the patients History and Physical, chart, labs and discussed the procedure including the risks, benefits and alternatives for the proposed anesthesia with the patient or authorized representative who has indicated his/her understanding and acceptance.   Dental advisory given  Plan Discussed with: CRNA, Anesthesiologist and Surgeon  Anesthesia Plan Comments:         Anesthesia Quick Evaluation

## 2012-07-17 ENCOUNTER — Encounter (HOSPITAL_BASED_OUTPATIENT_CLINIC_OR_DEPARTMENT_OTHER): Payer: Self-pay | Admitting: Specialist

## 2012-11-02 DIAGNOSIS — G4700 Insomnia, unspecified: Secondary | ICD-10-CM | POA: Insufficient documentation

## 2012-11-02 DIAGNOSIS — K219 Gastro-esophageal reflux disease without esophagitis: Secondary | ICD-10-CM | POA: Insufficient documentation

## 2012-11-02 DIAGNOSIS — H9190 Unspecified hearing loss, unspecified ear: Secondary | ICD-10-CM | POA: Insufficient documentation

## 2012-11-02 DIAGNOSIS — M19049 Primary osteoarthritis, unspecified hand: Secondary | ICD-10-CM | POA: Insufficient documentation

## 2012-11-13 ENCOUNTER — Emergency Department (HOSPITAL_BASED_OUTPATIENT_CLINIC_OR_DEPARTMENT_OTHER)
Admission: EM | Admit: 2012-11-13 | Discharge: 2012-11-13 | Disposition: A | Discharge status: Home / Self Care | Payer: Medicare Other | Attending: Emergency Medicine | Admitting: Emergency Medicine

## 2012-11-13 ENCOUNTER — Encounter (HOSPITAL_BASED_OUTPATIENT_CLINIC_OR_DEPARTMENT_OTHER): Payer: Self-pay

## 2012-11-13 ENCOUNTER — Emergency Department (HOSPITAL_BASED_OUTPATIENT_CLINIC_OR_DEPARTMENT_OTHER): Payer: Medicare Other

## 2012-11-13 DIAGNOSIS — J4489 Other specified chronic obstructive pulmonary disease: Secondary | ICD-10-CM | POA: Insufficient documentation

## 2012-11-13 DIAGNOSIS — R0781 Pleurodynia: Secondary | ICD-10-CM

## 2012-11-13 DIAGNOSIS — J449 Chronic obstructive pulmonary disease, unspecified: Secondary | ICD-10-CM | POA: Insufficient documentation

## 2012-11-13 DIAGNOSIS — Y9389 Activity, other specified: Secondary | ICD-10-CM | POA: Insufficient documentation

## 2012-11-13 DIAGNOSIS — K219 Gastro-esophageal reflux disease without esophagitis: Secondary | ICD-10-CM | POA: Insufficient documentation

## 2012-11-13 DIAGNOSIS — S79929A Unspecified injury of unspecified thigh, initial encounter: Secondary | ICD-10-CM | POA: Insufficient documentation

## 2012-11-13 DIAGNOSIS — W19XXXA Unspecified fall, initial encounter: Secondary | ICD-10-CM

## 2012-11-13 DIAGNOSIS — G4730 Sleep apnea, unspecified: Secondary | ICD-10-CM | POA: Insufficient documentation

## 2012-11-13 DIAGNOSIS — S298XXA Other specified injuries of thorax, initial encounter: Secondary | ICD-10-CM | POA: Insufficient documentation

## 2012-11-13 DIAGNOSIS — F17200 Nicotine dependence, unspecified, uncomplicated: Secondary | ICD-10-CM | POA: Insufficient documentation

## 2012-11-13 DIAGNOSIS — F411 Generalized anxiety disorder: Secondary | ICD-10-CM | POA: Insufficient documentation

## 2012-11-13 DIAGNOSIS — Z9981 Dependence on supplemental oxygen: Secondary | ICD-10-CM | POA: Insufficient documentation

## 2012-11-13 DIAGNOSIS — M25551 Pain in right hip: Secondary | ICD-10-CM

## 2012-11-13 DIAGNOSIS — W07XXXA Fall from chair, initial encounter: Secondary | ICD-10-CM | POA: Insufficient documentation

## 2012-11-13 DIAGNOSIS — Z79899 Other long term (current) drug therapy: Secondary | ICD-10-CM | POA: Insufficient documentation

## 2012-11-13 DIAGNOSIS — F329 Major depressive disorder, single episode, unspecified: Secondary | ICD-10-CM | POA: Insufficient documentation

## 2012-11-13 DIAGNOSIS — S79919A Unspecified injury of unspecified hip, initial encounter: Secondary | ICD-10-CM | POA: Insufficient documentation

## 2012-11-13 DIAGNOSIS — Z8739 Personal history of other diseases of the musculoskeletal system and connective tissue: Secondary | ICD-10-CM | POA: Insufficient documentation

## 2012-11-13 DIAGNOSIS — E039 Hypothyroidism, unspecified: Secondary | ICD-10-CM | POA: Insufficient documentation

## 2012-11-13 DIAGNOSIS — F3289 Other specified depressive episodes: Secondary | ICD-10-CM | POA: Insufficient documentation

## 2012-11-13 DIAGNOSIS — Y9289 Other specified places as the place of occurrence of the external cause: Secondary | ICD-10-CM | POA: Insufficient documentation

## 2012-11-13 DIAGNOSIS — Z98811 Dental restoration status: Secondary | ICD-10-CM | POA: Insufficient documentation

## 2012-11-13 MED ORDER — HYDROCODONE-ACETAMINOPHEN 5-325 MG PO TABS: 1.0000 | ORAL_TABLET | ORAL | Status: DC | PRN

## 2012-11-13 NOTE — ED Provider Notes (Signed)
History     CSN: [id number]  Arrival date & time 11/13/12  1417   First MD Initiated Contact with Patient 11/13/12 1433      Chief Complaint  Patient presents with  . Back Pain  . Fall    (Consider location/radiation/quality/duration/timing/severity/associated sxs/prior treatment) HPI Comments: Pt is c/o back pain and hip pain on there right side after falling out of a chair 4 days ago:pt states that she kept thinking things were going to get better but they haven't:no loc with fall:pt has been ambulating  The history is provided by the patient. No language interpreter was used.    Past Medical History  Diagnosis Date  . Depression   . COPD (chronic obstructive pulmonary disease)     denies SOB  . Hypothyroidism   . GERD (gastroesophageal reflux disease)   . Anxiety   . Sleep apnea     no CPAP; uses O2 at night 1l/min.  . Full dentures   . Bone spur of other site     spine  . Loose body in knee 06/2012    right    Past Surgical History  Procedure Laterality Date  . Abdominal hysterectomy      complete  . Knee arthroscopy Right 07/16/2012    Procedure: ARTHROSCOPY KNEE;  Surgeon: Kerrin Champagne, MD;  Location: [facility];  Service: Orthopedics;  Laterality: Right;  Right knee arthroscopy with chondroplastic shaving of retropatella surface    History reviewed. No pertinent family history.  History  Substance Use Topics  . Smoking status: Current Every Day Smoker -- 40 years    Types: Cigarettes  . Smokeless tobacco: Never Used     Comment: electronic cigarette plus regular cigarettes; 3-4 cig./day  . Alcohol Use: No    OB History   Grav Para Term Preterm Abortions TAB SAB Ect Mult Living                  Review of Systems  Allergies  Review of patient's allergies indicates no known allergies.  Home Medications   Current Outpatient Rx  Name  Route  Sig  Dispense  Refill  . gabapentin (NEURONTIN) 600 MG tablet   Oral   Take 600 mg by  mouth 3 (three) times daily.         Marland Kitchen levothyroxine (SYNTHROID, LEVOTHROID) 75 MCG tablet   Oral   Take 75 mcg by mouth daily.         Marland Kitchen omeprazole (PRILOSEC) 20 MG capsule   Oral   Take 20 mg by mouth 2 (two) times daily.         Marland Kitchen oxyCODONE (ROXICODONE) 15 MG immediate release tablet   Oral   Take 15 mg by mouth every 4 (four) hours as needed for pain.         Marland Kitchen oxyCODONE-acetaminophen (ROXICET) 5-325 MG per tablet   Oral   Take 1 tablet by mouth every 4 (four) hours as needed for pain (may use in addition to your baseline oxycodone.).   40 tablet   0   . TRAZODONE HCL PO   Oral   Take 100 mg by mouth at bedtime.          Marland Kitchen venlafaxine (EFFEXOR) 75 MG tablet   Oral   Take 75 mg by mouth daily.         Marland Kitchen ALPRAZolam (XANAX PO)   Oral   Take 0.5 mg by mouth 2 (two) times daily.          Marland Kitchen  BuPROPion HCl (WELLBUTRIN PO)   Oral   Take by mouth daily.          . DULoxetine HCl (CYMBALTA PO)   Oral   Take 60 mg by mouth daily.          . Zolpidem Tartrate (AMBIEN PO)   Oral   Take by mouth.             BP 105/46  Pulse 79  Temp(Src) 98.9 F (37.2 C) (Oral)  Resp 20  Ht 5\' 3"  (1.6 m)  Wt 116 lb (52.617 kg)  BMI 20.55 kg/m2  SpO2 92%  Physical Exam  Nursing note and vitals reviewed. Constitutional: She is oriented to person, place, and time. She appears well-developed and well-nourished.  HENT:  Head: Normocephalic and atraumatic.  Eyes: Conjunctivae and EOM are normal. Pupils are equal, round, and reactive to light.  Neck: Normal range of motion. Neck supple.  Cardiovascular: Normal rate and regular rhythm.   Pulmonary/Chest: Effort normal and breath sounds normal.  Right posterior and lateral rib pain  Abdominal: Soft. Bowel sounds are normal. There is no tenderness.  Musculoskeletal: Normal range of motion.  Tender in there right lateral and posterior hip  Neurological: She is alert and oriented to person, place, and time.  Skin:  Skin is warm and dry.  Psychiatric: She has a normal mood and affect.    ED Course  Procedures (including critical care time)  Labs Reviewed - No data to display Dg Ribs Unilateral W/chest Right  11/13/2012   *RADIOLOGY REPORT*  Clinical Data: Right-sided rib pain since a fall several days ago.  RIGHT RIBS AND CHEST - 3+ VIEW  Comparison: Chest x-ray dated 04/08/2012  Findings: There are old fractures of the right seventh and eighth ribs laterally.  No acute rib fractures.  No pneumothorax or lung contusion or pleural effusion.  Heart size and pulmonary vascularity are normal.  IMPRESSION: Old right lateral rib fractures.  No acute abnormalities.   Original Report Authenticated By: Francene Boyers, M.D.   Dg Hip Complete Right  11/13/2012   *RADIOLOGY REPORT*  Clinical Data: Right hip pain secondary to a fall.  RIGHT HIP - COMPLETE 2+ VIEW  Comparison: None.  Findings: There is no fracture, dislocation, joint space narrowing, or other significant abnormality.  IMPRESSION: Normal exam.   Original Report Authenticated By: Francene Boyers, M.D.     1. Fall, initial encounter   2. Hip pain, right   3. Rib pain on right side       MDM  Pt not having any neuro deficits:pt is okay to follow up:pt given vicodin for pain       Teressa Lower, NP 11/13/12 1542

## 2012-11-13 NOTE — ED Notes (Signed)
Pt states that she flipped backwards in a lawn chair, falling flat of her back.  Pt states that she has severe lower back pain and R hip pain.  Pt denies any other injury, LOC, or head injury.

## 2012-11-13 NOTE — ED Notes (Signed)
Vrinda Pickering, FNP at bedside 

## 2012-11-13 NOTE — ED Provider Notes (Signed)
Medical screening examination/treatment/procedure(s) were performed by non-physician practitioner and as supervising physician I was immediately available for consultation/collaboration.  Jaion Lagrange, MD 11/13/12 2055 

## 2012-11-18 ENCOUNTER — Emergency Department (HOSPITAL_BASED_OUTPATIENT_CLINIC_OR_DEPARTMENT_OTHER)
Admission: EM | Admit: 2012-11-18 | Discharge: 2012-11-18 | Disposition: A | Discharge status: Home / Self Care | Payer: Medicare Other | Attending: Emergency Medicine | Admitting: Emergency Medicine

## 2012-11-18 ENCOUNTER — Encounter (HOSPITAL_BASED_OUTPATIENT_CLINIC_OR_DEPARTMENT_OTHER): Payer: Self-pay | Admitting: *Deleted

## 2012-11-18 DIAGNOSIS — E039 Hypothyroidism, unspecified: Secondary | ICD-10-CM | POA: Insufficient documentation

## 2012-11-18 DIAGNOSIS — S0502XA Injury of conjunctiva and corneal abrasion without foreign body, left eye, initial encounter: Secondary | ICD-10-CM

## 2012-11-18 DIAGNOSIS — G4730 Sleep apnea, unspecified: Secondary | ICD-10-CM | POA: Insufficient documentation

## 2012-11-18 DIAGNOSIS — J449 Chronic obstructive pulmonary disease, unspecified: Secondary | ICD-10-CM | POA: Insufficient documentation

## 2012-11-18 DIAGNOSIS — Z8739 Personal history of other diseases of the musculoskeletal system and connective tissue: Secondary | ICD-10-CM | POA: Insufficient documentation

## 2012-11-18 DIAGNOSIS — K219 Gastro-esophageal reflux disease without esophagitis: Secondary | ICD-10-CM | POA: Insufficient documentation

## 2012-11-18 DIAGNOSIS — F329 Major depressive disorder, single episode, unspecified: Secondary | ICD-10-CM | POA: Insufficient documentation

## 2012-11-18 DIAGNOSIS — F3289 Other specified depressive episodes: Secondary | ICD-10-CM | POA: Insufficient documentation

## 2012-11-18 DIAGNOSIS — F17200 Nicotine dependence, unspecified, uncomplicated: Secondary | ICD-10-CM | POA: Insufficient documentation

## 2012-11-18 DIAGNOSIS — H1130 Conjunctival hemorrhage, unspecified eye: Secondary | ICD-10-CM | POA: Insufficient documentation

## 2012-11-18 DIAGNOSIS — Z9981 Dependence on supplemental oxygen: Secondary | ICD-10-CM | POA: Insufficient documentation

## 2012-11-18 DIAGNOSIS — Z23 Encounter for immunization: Secondary | ICD-10-CM | POA: Insufficient documentation

## 2012-11-18 DIAGNOSIS — J4489 Other specified chronic obstructive pulmonary disease: Secondary | ICD-10-CM | POA: Insufficient documentation

## 2012-11-18 DIAGNOSIS — S058X9A Other injuries of unspecified eye and orbit, initial encounter: Secondary | ICD-10-CM | POA: Insufficient documentation

## 2012-11-18 DIAGNOSIS — Y9289 Other specified places as the place of occurrence of the external cause: Secondary | ICD-10-CM | POA: Insufficient documentation

## 2012-11-18 DIAGNOSIS — H1132 Conjunctival hemorrhage, left eye: Secondary | ICD-10-CM

## 2012-11-18 DIAGNOSIS — Z79899 Other long term (current) drug therapy: Secondary | ICD-10-CM | POA: Insufficient documentation

## 2012-11-18 DIAGNOSIS — F411 Generalized anxiety disorder: Secondary | ICD-10-CM | POA: Insufficient documentation

## 2012-11-18 DIAGNOSIS — Y9389 Activity, other specified: Secondary | ICD-10-CM | POA: Insufficient documentation

## 2012-11-18 DIAGNOSIS — X58XXXA Exposure to other specified factors, initial encounter: Secondary | ICD-10-CM | POA: Insufficient documentation

## 2012-11-18 DIAGNOSIS — Z98811 Dental restoration status: Secondary | ICD-10-CM | POA: Insufficient documentation

## 2012-11-18 MED ORDER — TETANUS-DIPHTH-ACELL PERTUSSIS 5-2.5-18.5 LF-MCG/0.5 IM SUSP
0.5000 mL | Freq: Once | INTRAMUSCULAR | Status: AC
  Administered 2012-11-18: 0.5 mL via INTRAMUSCULAR
  Filled 2012-11-18: qty 0.5

## 2012-11-18 MED ORDER — FLUORESCEIN SODIUM 1 MG OP STRP
ORAL_STRIP | OPHTHALMIC | Status: AC
  Administered 2012-11-18: 23:00:00
  Filled 2012-11-18: qty 1

## 2012-11-18 MED ORDER — CIPROFLOXACIN HCL 0.3 % OP SOLN: 2.0000 [drp] | OPHTHALMIC | Status: DC

## 2012-11-18 MED ORDER — TETRACAINE HCL 0.5 % OP SOLN
OPHTHALMIC | Status: AC
  Administered 2012-11-18: 1 [drp] via OPHTHALMIC
  Filled 2012-11-18: qty 2

## 2012-11-18 MED ORDER — TETRACAINE HCL 0.5 % OP SOLN: 1.0000 [drp] | Freq: Once | OPHTHALMIC | Status: AC

## 2012-11-18 NOTE — ED Notes (Signed)
Pt was cutting metal piece this afternoon and thinks a piece of wire hit her in the left eye. Eye is red and watering.

## 2012-11-18 NOTE — ED Provider Notes (Signed)
History    CSN: [id number] Arrival date & time 11/18/12  2254  First MD Initiated Contact with Patient 11/18/12 2302     Chief Complaint  Patient presents with  . Eye Injury   (Consider location/radiation/quality/duration/timing/severity/associated sxs/prior Treatment) Patient is a 67 y.o. female presenting with eye injury. The history is provided by the patient. No language interpreter was used.  Eye Injury This is a new problem. The current episode started 12 to 24 hours ago. The problem occurs constantly. The problem has not changed since onset.Pertinent negatives include no chest pain, no abdominal pain, no headaches and no shortness of breath. Nothing aggravates the symptoms. Nothing relieves the symptoms. She has tried nothing for the symptoms. The treatment provided no relief.  Using floral wire for arrangement at 8 am and eye started tearing has gotten worse Past Medical History  Diagnosis Date  . Depression   . COPD (chronic obstructive pulmonary disease)     denies SOB  . Hypothyroidism   . GERD (gastroesophageal reflux disease)   . Anxiety   . Sleep apnea     no CPAP; uses O2 at night 1l/min.  . Full dentures   . Bone spur of other site     spine  . Loose body in knee 06/2012    right   Past Surgical History  Procedure Laterality Date  . Abdominal hysterectomy      complete  . Knee arthroscopy Right 07/16/2012    Procedure: ARTHROSCOPY KNEE;  Surgeon: Kerrin Champagne, MD;  Location: [facility];  Service: Orthopedics;  Laterality: Right;  Right knee arthroscopy with chondroplastic shaving of retropatella surface   History reviewed. No pertinent family history. History  Substance Use Topics  . Smoking status: Current Every Day Smoker -- 40 years    Types: Cigarettes  . Smokeless tobacco: Never Used     Comment: electronic cigarette plus regular cigarettes; 3-4 cig./day  . Alcohol Use: No   OB History   Grav Para Term Preterm Abortions TAB SAB Ect  Mult Living                 Review of Systems  Eyes: Positive for pain and redness. Negative for visual disturbance.  Respiratory: Negative for shortness of breath.   Cardiovascular: Negative for chest pain.  Gastrointestinal: Negative for abdominal pain.  Neurological: Negative for headaches.  All other systems reviewed and are negative.    Allergies  Review of patient's allergies indicates no known allergies.  Home Medications   Current Outpatient Rx  Name  Route  Sig  Dispense  Refill  . gabapentin (NEURONTIN) 600 MG tablet   Oral   Take 600 mg by mouth 3 (three) times daily.         Marland Kitchen levothyroxine (SYNTHROID, LEVOTHROID) 75 MCG tablet   Oral   Take 75 mcg by mouth daily.         Marland Kitchen omeprazole (PRILOSEC) 20 MG capsule   Oral   Take 20 mg by mouth 2 (two) times daily.         Marland Kitchen oxyCODONE (ROXICODONE) 15 MG immediate release tablet   Oral   Take 15 mg by mouth every 4 (four) hours as needed for pain.         . TRAZODONE HCL PO   Oral   Take 100 mg by mouth at bedtime.          Marland Kitchen venlafaxine (EFFEXOR) 75 MG tablet   Oral  Take 75 mg by mouth daily.         . Zolpidem Tartrate (AMBIEN PO)   Oral   Take by mouth.           . ALPRAZolam (XANAX PO)   Oral   Take 0.5 mg by mouth 2 (two) times daily.          . BuPROPion HCl (WELLBUTRIN PO)   Oral   Take by mouth daily.          . ciprofloxacin (CILOXAN) 0.3 % ophthalmic solution   Left Eye   Place 2 drops into the left eye every 2 (two) hours. Administer 1 drop, every 2 hours, while awake, for 2 days. Then 1 drop, every 4 hours, while awake, for the next 5 days.   5 mL   0   . DULoxetine HCl (CYMBALTA PO)   Oral   Take 60 mg by mouth daily.          Marland Kitchen HYDROcodone-acetaminophen (NORCO/VICODIN) 5-325 MG per tablet   Oral   Take 1 tablet by mouth every 4 (four) hours as needed for pain.   6 tablet   0   . oxyCODONE-acetaminophen (ROXICET) 5-325 MG per tablet   Oral   Take 1  tablet by mouth every 4 (four) hours as needed for pain (may use in addition to your baseline oxycodone.).   40 tablet   0    BP 100/81  Pulse 51  Temp(Src) 97.9 F (36.6 C) (Oral)  Resp 17  Ht 5\' 3"  (1.6 m)  Wt 116 lb (52.617 kg)  BMI 20.55 kg/m2  SpO2 95% Physical Exam  Constitutional: She is oriented to person, place, and time. She appears well-developed and well-nourished. No distress.  HENT:  Head: Normocephalic and atraumatic.  Mouth/Throat: Oropharynx is clear and moist.  Eyes: EOM are normal. Pupils are equal, round, and reactive to light. Left eye exhibits no chemosis, no discharge and no exudate. No foreign body present in the left eye. Left conjunctiva has a hemorrhage. Left eye exhibits normal extraocular motion and no nystagmus. Left pupil is round and reactive.    Neck: Normal range of motion. Neck supple.  Cardiovascular: Normal rate, regular rhythm and intact distal pulses.   Pulmonary/Chest: Effort normal and breath sounds normal. She has no wheezes.  Abdominal: Soft. Bowel sounds are normal. There is no tenderness. There is no rebound and no guarding.  Musculoskeletal: Normal range of motion.  Neurological: She is alert and oriented to person, place, and time.  Skin: Skin is warm and dry.  Psychiatric: She has a normal mood and affect.    ED Course  Procedures (including critical care time) Labs Reviewed - No data to display No results found. 1. Subconjunctival hemorrhage, left   2. Corneal abrasion, left, initial encounter     MDM  Ciloxam, tetanus updated follow up with optho in am  Zayne Draheim K Cassidee Deats-Rasch, MD 11/18/12 2329

## 2013-04-01 DIAGNOSIS — E559 Vitamin D deficiency, unspecified: Secondary | ICD-10-CM | POA: Insufficient documentation

## 2013-07-07 ENCOUNTER — Other Ambulatory Visit (HOSPITAL_COMMUNITY): Payer: Self-pay | Admitting: Specialist

## 2013-07-14 NOTE — H&P (Signed)
TOTAL KNEE ADMISSION H&P  Patient is being admitted for right total knee arthroplasty.  Subjective:  Chief Complaint:right knee pain.  HPI: Patricia Sutton, 68 y.o. female, has a history of pain and functional disability in the right knee due to arthritis and has failed non-surgical conservative treatments for greater than 12 weeks to includeNSAID's and/or analgesics, corticosteriod injections and viscosupplementation injections.  Onset of symptoms was gradual, starting 3 years ago with gradually worsening course since that time. The patient noted prior procedures on the knee to include  arthroscopy on the right knee(s).  Patient currently rates pain in the right knee(s) at 7 out of 10 with activity. Patient has night pain, worsening of pain with activity and weight bearing and pain that interferes with activities of daily living.  Patient has evidence of periarticular osteophytes and joint space narrowing by imaging studies. This patient has had persistent ongoing pain in her right knee since arthroscopic surgery to debride a free fragment almost a year ago.  Her MRI scan and her intraoperative findings showed significant tricompartmental osteoarthritis changes of her right knee. There is no active infection.  Patient Active Problem List   Diagnosis Date Noted  . Loose body of right knee 07/16/2012    Class: Chronic   Past Medical History  Diagnosis Date  . Depression   . COPD (chronic obstructive pulmonary disease)     denies SOB  . Hypothyroidism   . GERD (gastroesophageal reflux disease)   . Anxiety   . Sleep apnea     no CPAP; uses O2 at night 1l/min.  . Full dentures   . Bone spur of other site     spine  . Loose body in knee 06/2012    right    Past Surgical History  Procedure Laterality Date  . Abdominal hysterectomy      complete  . Knee arthroscopy Right 07/16/2012    Procedure: ARTHROSCOPY KNEE;  Surgeon: Kerrin Champagne, MD;  Location: [facility];  Service:  Orthopedics;  Laterality: Right;  Right knee arthroscopy with chondroplastic shaving of retropatella surface    No prescriptions prior to admission   No Known Allergies  History  Substance Use Topics  . Smoking status: Current Every Day Smoker -- 40 years    Types: Cigarettes  . Smokeless tobacco: Never Used     Comment: electronic cigarette plus regular cigarettes; 3-4 cig./day  . Alcohol Use: No    No family history on file.   Review of Systems  Musculoskeletal: Positive for joint pain.       Right knee pain  All other systems reviewed and are negative.    Objective:  Physical Exam  Constitutional: She is oriented to person, place, and time. She appears well-developed and well-nourished.  HENT:  Head: Normocephalic and atraumatic.  Eyes: EOM are normal. Pupils are equal, round, and reactive to light.  Neck: Normal range of motion.  Cardiovascular: Normal rate, normal heart sounds and intact distal pulses.   Respiratory: She has wheezes. She has rales.  GI: Soft.  Musculoskeletal:  extension of the right  knee is decreased by about 15 degrees.  Further flexion is possible to 115 degrees.  The knee is stable to varus and valgus stress  Neurological: She is alert and oriented to person, place, and time.  Skin: Skin is warm and dry.    Vital signs in last 24 hours:    Labs:   Estimated body mass index is 20.55 kg/(m^2) as  calculated from the following:   Height as of 11/18/12: 5\' 3"  (1.6 m).   Weight as of 11/18/12: 52.617 kg (116 lb).   Imaging Review Plain radiographs demonstrate severe degenerative joint disease of the right knee(s). The overall alignment ismild varus. The bone quality appears to be adequate for age and reported activity level.  Assessment/Plan:  End stage arthritis, right knee   The patient history, physical examination, clinical judgment of the provider and imaging studies are consistent with end stage degenerative joint disease of the right  knee(s) and total knee arthroplasty is deemed medically necessary. The treatment options including medical management, injection therapy arthroscopy and arthroplasty were discussed at length. The risks and benefits of total knee arthroplasty were presented and reviewed. The risks due to aseptic loosening, infection, stiffness, patella tracking problems, thromboembolic complications and other imponderables were discussed. The patient acknowledged the explanation, agreed to proceed with the plan and consent was signed. Patient is being admitted for inpatient treatment for surgery, pain control, PT, OT, prophylactic antibiotics, VTE prophylaxis, progressive ambulation and ADL's and discharge planning. The patient is planning to be discharged home with home health services

## 2013-07-16 ENCOUNTER — Encounter (HOSPITAL_COMMUNITY)
Admission: RE | Admit: 2013-07-16 | Discharge: 2013-07-16 | Disposition: A | Discharge status: Home / Self Care | Payer: Medicare Other | Source: Ambulatory Visit | Attending: Specialist | Admitting: Specialist

## 2013-07-16 ENCOUNTER — Encounter (HOSPITAL_COMMUNITY)
Admission: RE | Admit: 2013-07-16 | Discharge: 2013-07-16 | Disposition: A | Discharge status: Home / Self Care | Payer: Medicare Other | Source: Ambulatory Visit | Attending: Anesthesiology | Admitting: Anesthesiology

## 2013-07-16 ENCOUNTER — Encounter (HOSPITAL_COMMUNITY): Payer: Self-pay

## 2013-07-16 DIAGNOSIS — Z01818 Encounter for other preprocedural examination: Secondary | ICD-10-CM | POA: Insufficient documentation

## 2013-07-16 DIAGNOSIS — Z01810 Encounter for preprocedural cardiovascular examination: Secondary | ICD-10-CM

## 2013-07-16 DIAGNOSIS — Z01812 Encounter for preprocedural laboratory examination: Secondary | ICD-10-CM | POA: Insufficient documentation

## 2013-07-16 HISTORY — DX: Effusion, unspecified joint: M25.40

## 2013-07-16 HISTORY — DX: Personal history of colonic polyps: Z86.010

## 2013-07-16 HISTORY — DX: Unspecified osteoarthritis, unspecified site: M19.90

## 2013-07-16 HISTORY — DX: Insomnia, unspecified: G47.00

## 2013-07-16 HISTORY — DX: Other specified postprocedural states: R11.2

## 2013-07-16 HISTORY — DX: Pain in unspecified joint: M25.50

## 2013-07-16 HISTORY — DX: Personal history of colon polyps, unspecified: Z86.0100

## 2013-07-16 HISTORY — DX: Pneumonia, unspecified organism: J18.9

## 2013-07-16 HISTORY — DX: Personal history of other diseases of the respiratory system: Z87.09

## 2013-07-16 HISTORY — DX: Other specified postprocedural states: Z98.890

## 2013-07-16 HISTORY — DX: Urgency of urination: R39.15

## 2013-07-16 HISTORY — DX: Dorsalgia, unspecified: M54.9

## 2013-07-16 HISTORY — DX: Nocturia: R35.1

## 2013-07-16 HISTORY — DX: Other chronic pain: G89.29

## 2013-07-16 HISTORY — DX: Personal history of urinary calculi: Z87.442

## 2013-07-16 LAB — URINALYSIS, ROUTINE W REFLEX MICROSCOPIC
Bilirubin Urine: NEGATIVE
Glucose, UA: NEGATIVE mg/dL
Ketones, ur: NEGATIVE mg/dL
NITRITE: POSITIVE — AB
Protein, ur: NEGATIVE mg/dL
SPECIFIC GRAVITY, URINE: 1.016 (ref 1.005–1.030)
UROBILINOGEN UA: 0.2 mg/dL (ref 0.0–1.0)
pH: 6.5 (ref 5.0–8.0)

## 2013-07-16 LAB — CBC
HEMATOCRIT: 39.2 % (ref 36.0–46.0)
Hemoglobin: 12.8 g/dL (ref 12.0–15.0)
MCH: 29.4 pg (ref 26.0–34.0)
MCHC: 32.7 g/dL (ref 30.0–36.0)
MCV: 89.9 fL (ref 78.0–100.0)
Platelets: 327 10*3/uL (ref 150–400)
RBC: 4.36 MIL/uL (ref 3.87–5.11)
RDW: 15.5 % (ref 11.5–15.5)
WBC: 9.6 10*3/uL (ref 4.0–10.5)

## 2013-07-16 LAB — COMPREHENSIVE METABOLIC PANEL
ALT: 16 U/L (ref 0–35)
AST: 20 U/L (ref 0–37)
Albumin: 3.3 g/dL — ABNORMAL LOW (ref 3.5–5.2)
Alkaline Phosphatase: 107 U/L (ref 39–117)
BUN: 16 mg/dL (ref 6–23)
CALCIUM: 9.1 mg/dL (ref 8.4–10.5)
CO2: 27 mEq/L (ref 19–32)
CREATININE: 0.88 mg/dL (ref 0.50–1.10)
Chloride: 100 mEq/L (ref 96–112)
GFR calc non Af Amer: 67 mL/min — ABNORMAL LOW (ref >90)
GFR, EST AFRICAN AMERICAN: 78 mL/min — AB (ref >90)
GLUCOSE: 172 mg/dL — AB (ref 70–99)
Potassium: 3.9 mEq/L (ref 3.7–5.3)
Sodium: 141 mEq/L (ref 137–147)
TOTAL PROTEIN: 7 g/dL (ref 6.0–8.3)
Total Bilirubin: <0.2 mg/dL — ABNORMAL LOW (ref 0.3–1.2)

## 2013-07-16 LAB — URINE MICROSCOPIC-ADD ON

## 2013-07-16 LAB — TYPE AND SCREEN
ABO/RH(D): B POS
Antibody Screen: NEGATIVE

## 2013-07-16 LAB — APTT: aPTT: 27 seconds (ref 24–37)

## 2013-07-16 LAB — PROTIME-INR
INR: 0.96 (ref 0.00–1.49)
Prothrombin Time: 12.6 seconds (ref 11.6–15.2)

## 2013-07-16 LAB — ABO/RH: ABO/RH(D): B POS

## 2013-07-16 LAB — SURGICAL PCR SCREEN
MRSA, PCR: NEGATIVE
Staphylococcus aureus: NEGATIVE

## 2013-07-16 MED ORDER — CHLORHEXIDINE GLUCONATE 4 % EX LIQD: 60.0000 mL | Freq: Once | CUTANEOUS | Status: DC

## 2013-07-16 NOTE — Progress Notes (Signed)
Left message for Dr.Nitka to review UA

## 2013-07-16 NOTE — Pre-Procedure Instructions (Signed)
Marin CommentVicky D Huelsmann  07/16/2013   Your procedure is scheduled on:  Fri, Feb 20 @ 12:50 PM  [facility] 10:45 AM.  Call this number if you have problems the morning of surgery: [phone]   Remember:   Do not eat food or drink liquids after midnight.   Take these medicines the morning of surgery with A SIP OF WATER: Alprazolam(Xanax),Wellbutrin(Bupropion),Cymbalta(Duloxetine),Gabapentin(Neurontin),Pain Pill(if needed),Synthroid(Levothyroxine),Omeprazole(Prilosec),and Effexor(Venlafaxine)                No Goody's,BC's,Aleve,Aspirin,Ibuprofen,Fish Oil,or any Herbal Medications   Do not wear jewelry, make-up or nail polish.  Do not wear lotions, powders, or perfumes. You may wear deodorant.  Do not shave 48 hours prior to surgery.   Do not bring valuables to the hospital.  [facility]                  for any belongings or valuables.               Contacts, dentures or bridgework may not be worn into surgery.  Leave suitcase in the car. After surgery it may be brought to your room.  For patients admitted to the hospital, discharge time is determined by your                treatment team.                 Special Instructions:  [place] - Preparing for Surgery  Before surgery, you can play an important role.  Because skin is not sterile, your skin needs to be as free of germs as possible.  You can reduce the number of germs on you skin by washing with CHG (chlorahexidine gluconate) soap before surgery.  CHG is an antiseptic cleaner which kills germs and bonds with the skin to continue killing germs even after washing.  Please DO NOT use if you have an allergy to CHG or antibacterial soaps.  If your skin becomes reddened/irritated stop using the CHG and inform your nurse when you arrive at Short Stay.  Do not shave (including legs and underarms) for at least 48 hours prior to the first CHG shower.  You may shave your face.  Please follow  these instructions carefully:   1.  Shower with CHG Soap the night before surgery and the                                morning of Surgery.  2.  If you choose to wash your hair, wash your hair first as usual with your       normal shampoo.  3.  After you shampoo, rinse your hair and body thoroughly to remove the                      Shampoo.  4.  Use CHG as you would any other liquid soap.  You can apply chg directly       to the skin and wash gently with scrungie or a clean washcloth.  5.  Apply the CHG Soap to your body ONLY FROM THE NECK DOWN.        Do not use on open wounds or open sores.  Avoid contact with your eyes,       ears, mouth and genitals (private parts).  Wash genitals (private parts)  with your normal soap.  6.  Wash thoroughly, paying special attention to the area where your surgery        will be performed.  7.  Thoroughly rinse your body with warm water from the neck down.  8.  DO NOT shower/wash with your normal soap after using and rinsing off       the CHG Soap.  9.  Pat yourself dry with a clean towel.            10.  Wear clean pajamas.            11.  Place clean sheets on your bed the night of your first shower and do not        sleep with pets.  Day of Surgery  Do not apply any lotions/deoderants the morning of surgery.  Please wear clean clothes to the hospital/surgery center.     Please read over the following fact sheets that you were given: Pain Booklet, Coughing and Deep Breathing, Blood Transfusion Information, MRSA Information and Surgical Site Infection Prevention

## 2013-07-16 NOTE — Progress Notes (Addendum)
Pt doesn't have a cardiologist  Denies ever having an echo/stress test/heart cath    Medical Clearance in chart with Internal Medicine in [place]  Sleep study under media tab in epic   Denies ever having an ekg or cxr in yr

## 2013-07-17 MED ORDER — CEFAZOLIN SODIUM-DEXTROSE 2-3 GM-% IV SOLR
2.0000 g | INTRAVENOUS | Status: AC
  Administered 2013-07-18: 2 g via INTRAVENOUS
  Filled 2013-07-17: qty 50

## 2013-07-17 NOTE — Progress Notes (Signed)
Anesthesia Chart Review:  Patient is a 68 year old female scheduled for right TKR on 07/18/13 by Dr. Otelia SergeantNitka.  History includes smoking, post-operative N/V, COPD, OSA no CPAP but uses 1L/[place] O2, hypothyroidism, GERD, nephrolithiasis, anxiety, depression, hysterectomy, arthritis.   PCP is Dr. Ardyth GalJane Ybanez with [facility].  She medically cleared patient for this procedure.  EKG on 07/16/13 showed: NSR, LAD, septal infarct (age undetermined). Negative T wave in aVL.  I think her EKG appears stable since at least 01/30/11.  CXR on 07/16/13 showed no active cardiopulmonary disease.  Preoperative labs noted.  No fasting glucose was 172 (no documented history of DM).  Her PAT RN already left a message for Dr. Otelia SergeantNitka regarding UA results.  Defer treatment recommendations to surgeon. Will order a CBG X 1 to get baseline fasting glucose since glucose was > 150 at PAT.    Her EKG appears stable, and she was medically cleared.  If no acute changes then I anticipate that she can proceed as planned.  Velna Ochsllison Zelenak, PA-C [facility]Anesthesiology Phone [phone] 07/17/2013 9:30 AM

## 2013-07-18 ENCOUNTER — Encounter (HOSPITAL_COMMUNITY): Payer: Medicare Other | Admitting: Vascular Surgery

## 2013-07-18 ENCOUNTER — Encounter (HOSPITAL_COMMUNITY)
Admission: RE | Disposition: A | Discharge status: Home Health | Payer: Self-pay | Source: Ambulatory Visit | Attending: Specialist

## 2013-07-18 ENCOUNTER — Inpatient Hospital Stay (HOSPITAL_COMMUNITY)
Admission: RE | Admit: 2013-07-18 | Discharge: 2013-07-21 | DRG: 470 | Disposition: A | Discharge status: Home Health | Payer: Medicare Other | Source: Ambulatory Visit | Attending: Specialist | Admitting: Specialist

## 2013-07-18 ENCOUNTER — Inpatient Hospital Stay (HOSPITAL_COMMUNITY): Payer: Medicare Other | Admitting: Anesthesiology

## 2013-07-18 DIAGNOSIS — Z01810 Encounter for preprocedural cardiovascular examination: Secondary | ICD-10-CM

## 2013-07-18 DIAGNOSIS — K219 Gastro-esophageal reflux disease without esophagitis: Secondary | ICD-10-CM | POA: Diagnosis present

## 2013-07-18 DIAGNOSIS — F411 Generalized anxiety disorder: Secondary | ICD-10-CM | POA: Diagnosis present

## 2013-07-18 DIAGNOSIS — G4730 Sleep apnea, unspecified: Secondary | ICD-10-CM | POA: Diagnosis present

## 2013-07-18 DIAGNOSIS — J4489 Other specified chronic obstructive pulmonary disease: Secondary | ICD-10-CM | POA: Diagnosis present

## 2013-07-18 DIAGNOSIS — M1711 Unilateral primary osteoarthritis, right knee: Secondary | ICD-10-CM | POA: Diagnosis present

## 2013-07-18 DIAGNOSIS — Z96651 Presence of right artificial knee joint: Secondary | ICD-10-CM | POA: Insufficient documentation

## 2013-07-18 DIAGNOSIS — Z01818 Encounter for other preprocedural examination: Secondary | ICD-10-CM

## 2013-07-18 DIAGNOSIS — M1710 Unilateral primary osteoarthritis, unspecified knee: Principal | ICD-10-CM | POA: Diagnosis present

## 2013-07-18 DIAGNOSIS — J449 Chronic obstructive pulmonary disease, unspecified: Secondary | ICD-10-CM | POA: Diagnosis present

## 2013-07-18 DIAGNOSIS — F329 Major depressive disorder, single episode, unspecified: Secondary | ICD-10-CM | POA: Diagnosis present

## 2013-07-18 DIAGNOSIS — Z01812 Encounter for preprocedural laboratory examination: Secondary | ICD-10-CM

## 2013-07-18 DIAGNOSIS — F3289 Other specified depressive episodes: Secondary | ICD-10-CM | POA: Diagnosis present

## 2013-07-18 DIAGNOSIS — F17200 Nicotine dependence, unspecified, uncomplicated: Secondary | ICD-10-CM | POA: Diagnosis present

## 2013-07-18 DIAGNOSIS — G4700 Insomnia, unspecified: Secondary | ICD-10-CM | POA: Diagnosis present

## 2013-07-18 DIAGNOSIS — E039 Hypothyroidism, unspecified: Secondary | ICD-10-CM | POA: Diagnosis present

## 2013-07-18 HISTORY — PX: KNEE ARTHROPLASTY: SHX992

## 2013-07-18 LAB — GLUCOSE, CAPILLARY: Glucose-Capillary: 116 mg/dL — ABNORMAL HIGH (ref 70–99)

## 2013-07-18 SURGERY — ARTHROPLASTY, KNEE, TOTAL, USING IMAGELESS COMPUTER-ASSISTED NAVIGATION
Anesthesia: Regional | Site: Knee | Laterality: Right

## 2013-07-18 MED ORDER — LIDOCAINE HCL (CARDIAC) 20 MG/ML IV SOLN
INTRAVENOUS | Status: DC | PRN
  Administered 2013-07-18: 100 mg via INTRAVENOUS

## 2013-07-18 MED ORDER — NEOSTIGMINE METHYLSULFATE 1 MG/ML IJ SOLN
INTRAMUSCULAR | Status: DC | PRN
  Administered 2013-07-18: 3 mg via INTRAVENOUS

## 2013-07-18 MED ORDER — METHOCARBAMOL 500 MG PO TABS
500.0000 mg | ORAL_TABLET | Freq: Four times a day (QID) | ORAL | Status: DC | PRN
  Administered 2013-07-19 – 2013-07-21 (×5): 500 mg via ORAL
  Filled 2013-07-18 (×7): qty 1

## 2013-07-18 MED ORDER — FENTANYL CITRATE 0.05 MG/ML IJ SOLN
INTRAMUSCULAR | Status: AC
  Filled 2013-07-18: qty 2

## 2013-07-18 MED ORDER — OXYCODONE HCL 5 MG PO TABS
5.0000 mg | ORAL_TABLET | ORAL | Status: DC | PRN
  Administered 2013-07-18 – 2013-07-21 (×16): 10 mg via ORAL
  Filled 2013-07-18 (×16): qty 2

## 2013-07-18 MED ORDER — FLEET ENEMA 7-19 GM/118ML RE ENEM: 1.0000 | ENEMA | Freq: Once | RECTAL | Status: AC | PRN

## 2013-07-18 MED ORDER — KETOROLAC TROMETHAMINE 30 MG/ML IJ SOLN
INTRAMUSCULAR | Status: AC
  Administered 2013-07-18: 15 mg
  Filled 2013-07-18: qty 1

## 2013-07-18 MED ORDER — ACETAMINOPHEN 650 MG RE SUPP: 650.0000 mg | Freq: Four times a day (QID) | RECTAL | Status: DC | PRN

## 2013-07-18 MED ORDER — METOCLOPRAMIDE HCL 10 MG PO TABS: 5.0000 mg | ORAL_TABLET | Freq: Three times a day (TID) | ORAL | Status: DC | PRN

## 2013-07-18 MED ORDER — ROCURONIUM BROMIDE 50 MG/5ML IV SOLN
INTRAVENOUS | Status: AC
  Filled 2013-07-18: qty 1

## 2013-07-18 MED ORDER — VENLAFAXINE HCL 75 MG PO TABS
75.0000 mg | ORAL_TABLET | Freq: Every day | ORAL | Status: DC
  Administered 2013-07-18 – 2013-07-19 (×2): 75 mg via ORAL
  Filled 2013-07-18 (×2): qty 1

## 2013-07-18 MED ORDER — HYDROMORPHONE HCL PF 1 MG/ML IJ SOLN
0.5000 mg | INTRAMUSCULAR | Status: DC | PRN
  Filled 2013-07-18: qty 1

## 2013-07-18 MED ORDER — GLYCOPYRROLATE 0.2 MG/ML IJ SOLN
INTRAMUSCULAR | Status: DC | PRN
  Administered 2013-07-18: 0.6 mg via INTRAVENOUS

## 2013-07-18 MED ORDER — HYDROMORPHONE HCL PF 1 MG/ML IJ SOLN
INTRAMUSCULAR | Status: AC
  Filled 2013-07-18: qty 1

## 2013-07-18 MED ORDER — SODIUM CHLORIDE 0.9 % IR SOLN
Status: DC | PRN
  Administered 2013-07-18: 3000 mL

## 2013-07-18 MED ORDER — METOCLOPRAMIDE HCL 5 MG/ML IJ SOLN: 5.0000 mg | Freq: Three times a day (TID) | INTRAMUSCULAR | Status: DC | PRN

## 2013-07-18 MED ORDER — SODIUM CHLORIDE 0.9 % IV SOLN
INTRAVENOUS | Status: DC
  Administered 2013-07-18 – 2013-07-19 (×2): via INTRAVENOUS

## 2013-07-18 MED ORDER — OXYCODONE HCL 5 MG PO TABS
ORAL_TABLET | ORAL | Status: AC
  Filled 2013-07-18: qty 2

## 2013-07-18 MED ORDER — HYDROMORPHONE HCL PF 1 MG/ML IJ SOLN
0.5000 mg | INTRAMUSCULAR | Status: AC | PRN
  Administered 2013-07-18 (×4): 0.5 mg via INTRAVENOUS

## 2013-07-18 MED ORDER — MIDAZOLAM HCL 5 MG/5ML IJ SOLN
INTRAMUSCULAR | Status: DC | PRN
  Administered 2013-07-18: 1 mg via INTRAVENOUS

## 2013-07-18 MED ORDER — POLYETHYLENE GLYCOL 3350 17 G PO PACK
17.0000 g | PACK | Freq: Every day | ORAL | Status: DC | PRN
  Administered 2013-07-20: 17 g via ORAL
  Filled 2013-07-18: qty 1

## 2013-07-18 MED ORDER — FENTANYL CITRATE 0.05 MG/ML IJ SOLN
INTRAMUSCULAR | Status: AC
  Filled 2013-07-18: qty 5

## 2013-07-18 MED ORDER — KETOROLAC TROMETHAMINE 15 MG/ML IJ SOLN
7.5000 mg | Freq: Four times a day (QID) | INTRAMUSCULAR | Status: AC
  Administered 2013-07-19 (×2): 7.5 mg via INTRAVENOUS
  Filled 2013-07-18 (×4): qty 1

## 2013-07-18 MED ORDER — FENTANYL CITRATE 0.05 MG/ML IJ SOLN
INTRAMUSCULAR | Status: DC | PRN
  Administered 2013-07-18: 75 ug via INTRAVENOUS
  Administered 2013-07-18: 50 ug via INTRAVENOUS
  Administered 2013-07-18: 75 ug via INTRAVENOUS
  Administered 2013-07-18: 50 ug via INTRAVENOUS

## 2013-07-18 MED ORDER — ACETAMINOPHEN 325 MG PO TABS
650.0000 mg | ORAL_TABLET | Freq: Four times a day (QID) | ORAL | Status: DC | PRN
  Administered 2013-07-21: 650 mg via ORAL
  Filled 2013-07-18: qty 2

## 2013-07-18 MED ORDER — MIDAZOLAM HCL 2 MG/2ML IJ SOLN
INTRAMUSCULAR | Status: AC
  Filled 2013-07-18: qty 2

## 2013-07-18 MED ORDER — EPHEDRINE SULFATE 50 MG/ML IJ SOLN
INTRAMUSCULAR | Status: DC | PRN
  Administered 2013-07-18 (×2): 5 mg via INTRAVENOUS

## 2013-07-18 MED ORDER — FENTANYL CITRATE 0.05 MG/ML IJ SOLN
25.0000 ug | INTRAMUSCULAR | Status: DC | PRN
  Administered 2013-07-18 (×3): 50 ug via INTRAVENOUS

## 2013-07-18 MED ORDER — LACTATED RINGERS IV SOLN
INTRAVENOUS | Status: DC
  Administered 2013-07-18: 12:00:00 via INTRAVENOUS

## 2013-07-18 MED ORDER — DULOXETINE HCL 20 MG PO CPEP: 40.0000 mg | ORAL_CAPSULE | Freq: Every day | ORAL | Status: DC

## 2013-07-18 MED ORDER — MIDAZOLAM HCL 2 MG/2ML IJ SOLN
INTRAMUSCULAR | Status: AC
  Administered 2013-07-18: 2 mg
  Filled 2013-07-18: qty 2

## 2013-07-18 MED ORDER — ACETAMINOPHEN 10 MG/ML IV SOLN
1000.0000 mg | Freq: Once | INTRAVENOUS | Status: DC
Start: 2013-07-18 — End: 2013-07-18
  Filled 2013-07-18: qty 100

## 2013-07-18 MED ORDER — SORBITOL 70 % SOLN: 30.0000 mL | Freq: Every day | Status: DC | PRN

## 2013-07-18 MED ORDER — BUPIVACAINE-EPINEPHRINE (PF) 0.5% -1:200000 IJ SOLN
INTRAMUSCULAR | Status: AC
  Filled 2013-07-18: qty 10

## 2013-07-18 MED ORDER — DOCUSATE SODIUM 100 MG PO CAPS
100.0000 mg | ORAL_CAPSULE | Freq: Two times a day (BID) | ORAL | Status: DC
  Administered 2013-07-18 – 2013-07-21 (×6): 100 mg via ORAL
  Filled 2013-07-18 (×7): qty 1

## 2013-07-18 MED ORDER — ROCURONIUM BROMIDE 100 MG/10ML IV SOLN
INTRAVENOUS | Status: DC | PRN
  Administered 2013-07-18: 35 mg via INTRAVENOUS

## 2013-07-18 MED ORDER — ASPIRIN EC 325 MG PO TBEC
325.0000 mg | DELAYED_RELEASE_TABLET | Freq: Every day | ORAL | Status: DC
  Administered 2013-07-19 – 2013-07-21 (×3): 325 mg via ORAL
  Filled 2013-07-18 (×4): qty 1

## 2013-07-18 MED ORDER — MENTHOL 3 MG MT LOZG
1.0000 | LOZENGE | OROMUCOSAL | Status: DC | PRN
  Filled 2013-07-18: qty 9

## 2013-07-18 MED ORDER — ONDANSETRON HCL 4 MG/2ML IJ SOLN: 4.0000 mg | Freq: Four times a day (QID) | INTRAMUSCULAR | Status: DC | PRN

## 2013-07-18 MED ORDER — CELECOXIB 200 MG PO CAPS
200.0000 mg | ORAL_CAPSULE | Freq: Two times a day (BID) | ORAL | Status: DC
  Administered 2013-07-18 – 2013-07-21 (×6): 200 mg via ORAL
  Filled 2013-07-18 (×7): qty 1

## 2013-07-18 MED ORDER — LACTATED RINGERS IV SOLN
INTRAVENOUS | Status: DC | PRN
  Administered 2013-07-18 (×2): via INTRAVENOUS

## 2013-07-18 MED ORDER — FENTANYL CITRATE 0.05 MG/ML IJ SOLN
100.0000 ug | Freq: Once | INTRAMUSCULAR | Status: AC
  Administered 2013-07-18: 100 ug via INTRAVENOUS

## 2013-07-18 MED ORDER — PHENOL 1.4 % MT LIQD
1.0000 | OROMUCOSAL | Status: DC | PRN
  Filled 2013-07-18: qty 177

## 2013-07-18 MED ORDER — GLYCOPYRROLATE 0.2 MG/ML IJ SOLN
INTRAMUSCULAR | Status: AC
  Filled 2013-07-18: qty 3

## 2013-07-18 MED ORDER — PROPOFOL 10 MG/ML IV BOLUS
INTRAVENOUS | Status: AC
  Filled 2013-07-18: qty 20

## 2013-07-18 MED ORDER — PANTOPRAZOLE SODIUM 40 MG PO TBEC
40.0000 mg | DELAYED_RELEASE_TABLET | Freq: Every day | ORAL | Status: DC
  Administered 2013-07-18 – 2013-07-21 (×4): 40 mg via ORAL
  Filled 2013-07-18 (×5): qty 1

## 2013-07-18 MED ORDER — METHOCARBAMOL 100 MG/ML IJ SOLN
500.0000 mg | Freq: Four times a day (QID) | INTRAMUSCULAR | Status: DC | PRN
  Administered 2013-07-18: 500 mg via INTRAVENOUS
  Filled 2013-07-18 (×2): qty 5

## 2013-07-18 MED ORDER — ONDANSETRON HCL 4 MG/2ML IJ SOLN
INTRAMUSCULAR | Status: AC
  Filled 2013-07-18: qty 2

## 2013-07-18 MED ORDER — GABAPENTIN 600 MG PO TABS
600.0000 mg | ORAL_TABLET | Freq: Three times a day (TID) | ORAL | Status: DC
  Administered 2013-07-18 – 2013-07-21 (×8): 600 mg via ORAL
  Filled 2013-07-18 (×10): qty 1

## 2013-07-18 MED ORDER — MENTHOL 3 MG MT LOZG
LOZENGE | OROMUCOSAL | Status: AC
  Filled 2013-07-18: qty 9

## 2013-07-18 MED ORDER — TRAZODONE HCL 100 MG PO TABS
100.0000 mg | ORAL_TABLET | Freq: Every evening | ORAL | Status: DC | PRN
  Administered 2013-07-19: 100 mg via ORAL
  Filled 2013-07-18: qty 1

## 2013-07-18 MED ORDER — ACETAMINOPHEN 10 MG/ML IV SOLN
INTRAVENOUS | Status: DC | PRN
  Administered 2013-07-18: 1000 mg via INTRAVENOUS

## 2013-07-18 MED ORDER — BUPROPION HCL 100 MG PO TABS: 100.0000 mg | ORAL_TABLET | Freq: Every day | ORAL | Status: DC

## 2013-07-18 MED ORDER — ONDANSETRON HCL 4 MG/2ML IJ SOLN
INTRAMUSCULAR | Status: DC | PRN
  Administered 2013-07-18: 4 mg via INTRAVENOUS

## 2013-07-18 MED ORDER — ONDANSETRON HCL 4 MG PO TABS
4.0000 mg | ORAL_TABLET | Freq: Four times a day (QID) | ORAL | Status: DC | PRN
  Administered 2013-07-20: 4 mg via ORAL
  Filled 2013-07-18: qty 1

## 2013-07-18 MED ORDER — MIDAZOLAM HCL 2 MG/2ML IJ SOLN: 2.0000 mg | Freq: Once | INTRAMUSCULAR | Status: DC

## 2013-07-18 MED ORDER — ALPRAZOLAM 0.5 MG PO TABS
0.5000 mg | ORAL_TABLET | Freq: Two times a day (BID) | ORAL | Status: DC | PRN
  Administered 2013-07-18 – 2013-07-20 (×2): 0.5 mg via ORAL
  Filled 2013-07-18 (×2): qty 1

## 2013-07-18 MED ORDER — PROPOFOL 10 MG/ML IV BOLUS
INTRAVENOUS | Status: DC | PRN
  Administered 2013-07-18: 120 mg via INTRAVENOUS

## 2013-07-18 MED ORDER — ZOLPIDEM TARTRATE 5 MG PO TABS: 5.0000 mg | ORAL_TABLET | Freq: Every evening | ORAL | Status: DC | PRN

## 2013-07-18 MED ORDER — LEVOTHYROXINE SODIUM 75 MCG PO TABS
75.0000 ug | ORAL_TABLET | Freq: Every day | ORAL | Status: DC
  Administered 2013-07-19 – 2013-07-21 (×3): 75 ug via ORAL
  Filled 2013-07-18 (×4): qty 1

## 2013-07-18 MED ORDER — NEOSTIGMINE METHYLSULFATE 1 MG/ML IJ SOLN
INTRAMUSCULAR | Status: AC
  Filled 2013-07-18: qty 10

## 2013-07-18 SURGICAL SUPPLY — 68 items
ADH SKN CLS APL DERMABOND .7 (GAUZE/BANDAGES/DRESSINGS) ×1
BANDAGE ELASTIC 4 VELCRO ST LF (GAUZE/BANDAGES/DRESSINGS) ×3 IMPLANT
BANDAGE ELASTIC 6 VELCRO ST LF (GAUZE/BANDAGES/DRESSINGS) ×2 IMPLANT
BANDAGE ESMARK 6X9 LF (GAUZE/BANDAGES/DRESSINGS) ×1 IMPLANT
BLADE SAG 18X100X1.27 (BLADE) ×3 IMPLANT
BLADE SAGITTAL 25.0X1.27X90 (BLADE) IMPLANT
BLADE SAGITTAL 25.0X1.27X90MM (BLADE)
BLADE SAW SGTL 13X75X1.27 (BLADE) ×3 IMPLANT
BNDG CMPR 9X6 STRL LF SNTH (GAUZE/BANDAGES/DRESSINGS) ×1
BNDG CMPR MED 10X6 ELC LF (GAUZE/BANDAGES/DRESSINGS) ×1
BNDG ELASTIC 6X10 VLCR STRL LF (GAUZE/BANDAGES/DRESSINGS) ×3 IMPLANT
BNDG ESMARK 6X9 LF (GAUZE/BANDAGES/DRESSINGS) ×3
CAPT RP KNEE ×2 IMPLANT
CLOTH BEACON ORANGE TIMEOUT ST (SAFETY) ×3 IMPLANT
COVER SURGICAL LIGHT HANDLE (MISCELLANEOUS) ×3 IMPLANT
CUFF TOURNIQUET SINGLE 34IN LL (TOURNIQUET CUFF) IMPLANT
CUFF TOURNIQUET SINGLE 44IN (TOURNIQUET CUFF) IMPLANT
DERMABOND ADVANCED (GAUZE/BANDAGES/DRESSINGS) ×2
DERMABOND ADVANCED .7 DNX12 (GAUZE/BANDAGES/DRESSINGS) ×1 IMPLANT
DRAPE ORTHO SPLIT 77X108 STRL (DRAPES) ×6
DRAPE SURG ORHT 6 SPLT 77X108 (DRAPES) ×2 IMPLANT
DRAPE U-SHAPE 47X51 STRL (DRAPES) ×3 IMPLANT
DRSG PAD ABDOMINAL 8X10 ST (GAUZE/BANDAGES/DRESSINGS) ×3 IMPLANT
DURAPREP 26ML APPLICATOR (WOUND CARE) ×3 IMPLANT
ELECT REM PT RETURN 9FT ADLT (ELECTROSURGICAL) ×3
ELECTRODE REM PT RTRN 9FT ADLT (ELECTROSURGICAL) ×1 IMPLANT
EVACUATOR 1/8 PVC DRAIN (DRAIN) IMPLANT
FACESHIELD LNG OPTICON STERILE (SAFETY) ×6 IMPLANT
GLOVE BIOGEL PI IND STRL 7.5 (GLOVE) ×1 IMPLANT
GLOVE BIOGEL PI INDICATOR 7.5 (GLOVE) ×2
GLOVE ECLIPSE 7.0 STRL STRAW (GLOVE) ×3 IMPLANT
GLOVE ECLIPSE 8.5 STRL (GLOVE) ×3 IMPLANT
GLOVE SURG 8.5 LATEX PF (GLOVE) ×3 IMPLANT
GOWN STRL NON-REIN LRG LVL3 (GOWN DISPOSABLE) ×6 IMPLANT
GOWN STRL REUS W/TWL 2XL LVL3 (GOWN DISPOSABLE) ×3 IMPLANT
HANDPIECE INTERPULSE COAX TIP (DISPOSABLE) ×3
IMMOBILIZER KNEE 22 UNIV (SOFTGOODS) IMPLANT
KIT BASIN OR (CUSTOM PROCEDURE TRAY) ×3 IMPLANT
KIT ROOM TURNOVER OR (KITS) ×3 IMPLANT
MANIFOLD NEPTUNE II (INSTRUMENTS) ×3 IMPLANT
MARKER SPHERE PSV REFLC THRD 5 (MARKER) ×9 IMPLANT
NS IRRIG 1000ML POUR BTL (IV SOLUTION) ×3 IMPLANT
PACK TOTAL JOINT (CUSTOM PROCEDURE TRAY) ×3 IMPLANT
PAD ABD 8X10 STRL (GAUZE/BANDAGES/DRESSINGS) ×2 IMPLANT
PAD ARMBOARD 7.5X6 YLW CONV (MISCELLANEOUS) ×6 IMPLANT
PAD CAST 4YDX4 CTTN HI CHSV (CAST SUPPLIES) ×1 IMPLANT
PADDING CAST COTTON 4X4 STRL (CAST SUPPLIES) ×3
PADDING CAST COTTON 6X4 STRL (CAST SUPPLIES) ×3 IMPLANT
PIN SCHANZ 4MM 130MM (PIN) ×12 IMPLANT
SET HNDPC FAN SPRY TIP SCT (DISPOSABLE) ×1 IMPLANT
SPONGE GAUZE 4X4 12PLY (GAUZE/BANDAGES/DRESSINGS) ×3 IMPLANT
SPONGE GAUZE 4X4 12PLY STER LF (GAUZE/BANDAGES/DRESSINGS) ×2 IMPLANT
STAPLER VISISTAT 35W (STAPLE) IMPLANT
SUCTION FRAZIER TIP 10 FR DISP (SUCTIONS) IMPLANT
SUT BONE WAX W31G (SUTURE) IMPLANT
SUT VIC AB 0 CT1 27 (SUTURE) ×6
SUT VIC AB 0 CT1 27XBRD ANBCTR (SUTURE) ×2 IMPLANT
SUT VIC AB 1 CT1 27 (SUTURE) ×18
SUT VIC AB 1 CT1 27XBRD ANBCTR (SUTURE) ×6 IMPLANT
SUT VIC AB 2-0 CT1 27 (SUTURE) ×9
SUT VIC AB 2-0 CT1 TAPERPNT 27 (SUTURE) ×3 IMPLANT
SUT VIC AB 3-0 FS2 27 (SUTURE) ×3 IMPLANT
SYR CONTROL 10ML LL (SYRINGE) IMPLANT
TOWEL OR 17X24 6PK STRL BLUE (TOWEL DISPOSABLE) ×3 IMPLANT
TOWEL OR 17X26 10 PK STRL BLUE (TOWEL DISPOSABLE) ×3 IMPLANT
TOWER CARTRIDGE SMART MIX (DISPOSABLE) ×3 IMPLANT
TRAY FOLEY CATH 16FRSI W/METER (SET/KITS/TRAYS/PACK) IMPLANT
WATER STERILE IRR 1000ML POUR (IV SOLUTION) ×12 IMPLANT

## 2013-07-18 NOTE — Anesthesia Procedure Notes (Addendum)
Procedure Name: Intubation Date/Time: 07/18/2013 1:31 PM Performed by: Whitman HeroVITSHTEYN, ALEX Pre-anesthesia Checklist: Patient identified, Emergency Drugs available, Suction available and Patient being monitored Patient Re-evaluated:Patient Re-evaluated prior to inductionOxygen Delivery Method: Circle system utilized Preoxygenation: Pre-oxygenation with 100% oxygen Intubation Type: IV induction Ventilation: Mask ventilation without difficulty and Oral airway inserted - appropriate to patient size Laryngoscope Size: Mac and 3 Grade View: Grade I Tube type: Oral Tube size: 7.0 mm Number of attempts: 1 Airway Equipment and Method: Patient positioned with wedge pillow Secured at: 20 cm Tube secured with: Tape Dental Injury: Teeth and Oropharynx as per pre-operative assessment    Anesthesia Regional Block:  Femoral nerve block  Pre-Anesthetic Checklist: ,, timeout performed, Correct Patient, Correct Site, Correct Laterality, Correct Procedure, Correct Position, site marked, Risks and benefits discussed, Surgical consent, Pre-op evaluation  Laterality: Right  Prep: chloraprep       Needles:  Injection technique: Single-shot  Needle Type: Echogenic Stimulator Needle          Additional Needles:  Procedures: Doppler guided and nerve stimulator Femoral nerve block  Nerve Stimulator or Paresthesia:  Response: 0.5 mA,   Additional Responses:   Narrative:  Start time: 07/18/2013 12:50 PM End time: 07/18/2013 1:05 PM Injection made incrementally with aspirations every 5 mL.  Performed by: Personally  Anesthesiologist: Dr. Randa EvensEdwards

## 2013-07-18 NOTE — Anesthesia Postprocedure Evaluation (Signed)
  Anesthesia Post-op Note  Patient: Patricia Sutton  Procedure(s) Performed: Procedure(s): COMPUTER ASSISTED RIGHT TOTAL KNEE ARTHROPLASTY (Right)  Patient Location: PACU  Anesthesia Type:General  Level of Consciousness: awake  Airway and Oxygen Therapy: Patient Spontanous Breathing  Post-op Pain: mild  Post-op Assessment: Post-op Vital signs reviewed  Post-op Vital Signs: Reviewed  Complications: No apparent anesthesia complications

## 2013-07-18 NOTE — H&P (Signed)
The patient has been re-examined, and the chart reviewed, and there have been no interval changes to the documented history and physical.    The risks, benefits, and alternatives have been discussed at length, and the patient is willing to proceed.   

## 2013-07-18 NOTE — OR Nursing (Signed)
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 Confirmed this w/ grandson and husband at bedside. Pt spastic gross motor movement observed by family to be normal.

## 2013-07-18 NOTE — Op Note (Signed)
07/18/2013  3:48 PM  PATIENT:  Patricia Sutton  68 y.o. female  MRN: [id number]  OPERATIVE REPORT   PRE-OPERATIVE DIAGNOSIS:  Right knee arthritis  POST-OPERATIVE DIAGNOSIS:  Right knee arthritis  PROCEDURE:  Procedure(s): COMPUTER ASSISTED RIGHT TOTAL KNEE ARTHROPLASTY    SURGEON: Jessy Oto, MD     ASSISTANT:  Phillips Hay, PA-C  (Present throughout the entire procedure and necessary for completion of procedure in a timely manner)     ANESTHESIA:  General with supplemental right obturator branch block. Dr. Sherren Kerns.    COMPLICATIONS:  None.     COMPONENTS:  DePuy Sigma cemented total knee system.  A #2.5  femoral component.  A #2.5tibial tray with a 50m polyethylene RP tibial spacer , a 35 mm polyethylene patella.     PROCEDURE:The patient was met in the holding area, and the appropriate right knee identified and marked with "X" and my initials. The patient received a preoperative obturator branch nerve block by anesthesia.  The patient was then transported to OR and was placed on the operative table in a supine position. The patient was then placed under general anesthesia without difficulty. The patient received appropriate preoperative antibiotic prophylaxis 1 gm ancef. The patien'ts right knee was examined under anesthesia and shown to have 0-110 range of motion,valgus deformity, ligamentously stable, and lateral patella tracking. Nursing staff inserted a Foley catheter under sterile conditions.  Tourniquet was applied to the operative thigh. Leg was then prepped using sterile conditions and draped using sterile technique. Time-out procedure was called and correct knee identified.  The leg was elevated and Esmarch exsanguinated with a thigh  tourniquet elevated ot 280 mmHg.  Initially, through a 12-cm longitudinal incision based over the patella initial exposure was made. The underlying subcutaneous tissues were incised along with the skin incision. A median arthrotomy was  performed revealing an excessive amount of normal-appearing joint fluid, The articular surfaces were inspected. The patient had grade 3 changes laterally, grade 4 changes medially and grade 4 changes in the patellofemoral joint. Osteophytes were removed from the femoral condyles and the tibial plateau. The medial and lateral meniscal remnants were removed as well as the anterior cruciate ligament.  Pins were then placed in the anterior medial distal femur using the insertion guide and the femoral array placed. 2 pins were placed in the anterior lateral aspect of the mid tibia using the insertion guide through stab incisions. Tibial array was then placed. The computer infrared collector was then carefully aligned and the knee placed through a range of motion. The arrays were then carefully fixed in position and alignment. The hip axis of rotation determined without difficulty. Medial and lateral  epicondyle of the ankle determined. Then the anterior cruciate ligament insertion point and points over the medial, lateral, anterior and posterior proximal tibia. The degree of rotation determined and then points collected for the medial and lateral tibial plateau surfaces. Attention then turned to the femur where collection points were obtained from the anterior distal femur the point just anterior to the femoral notch medial and lateral epicondyles and then painting of the anterior distal femur and medial and lateral femoral condyles distally and posteriorly. Whitesides line was also determined. Anterior bow of the femur measured at 0. Based on this then the amount of proximal tibial cut was determined to be in line with off the depressed lateral side of the joint. Using the proximal tibial cutting jig with the verification guidepin the jig was pinned to the proximal  tibia within a millimeter and 1 of expected. 3 pins were used. Proximal tibial cut was then performed and verified. Soft tissue tensioning then examined  and found to be tight laterally in both flexion and extension. The lateral retinaculum of the knee was released as was the lateral capsular insertion into the lateral tibial plateau.  #4 femoral component chosen. The trial placed against the end of the femur and felt to be a good fit. Distal femur cutting jig was then carefully positioned on the distal femur using the verification the guide to within one degree and one millimeter of expected. 3 pins used in the jig in place and the distal transverse cut performed carefully protecting the soft tissue. Rotation guide was then attached after verification of the extension block at about 10 mm was again excellent tissue tensioning. Rotation guide was then attached and correct the alignment 2 pins placed over the distal cut surface of the femur for placement of the jig for the chamfer cuts and coronal cuts. This is a carefully placed and held in place with clamps. Protecting soft tissues then the anterior cut was made after first verifying with the wing depth of the cut. Then the posterior coronal cuts protect soft tissue structures posteriorly. Posterior chamfer cut and anterior chamfer cut. These were done without difficulty. Cutting guide removed and box cutting jig was then applied to the distal femur carefully aligned and pinned into place. Box cut was then performed from the distal femur intercondylar box. Proximal tibia was then subluxed the posterior cruciate resected. A #2.5 plate attached to the transverse cut surface of the proximal tibia using 2 pins. The upright therefore the reamer was then applied and reaming carried out for the keel for the tibial component. Using the osteotome was then inserted and impacted into place the temporary fixation pins removed from the proximal tibial plate. Femoral component was inserted using a trial component. Then a 10 mm insert placed and the knee brought into full extension full extension to 0  was possible full flexion   135 without difficulty or lift off. The components were accepted and the permanent #2.5 femoral and #2.5 tibial components chosen and brought onto the field. Patella was observed to be large in its width anterior-posterior 21 mm in the residual 16 mm was allowed for the patella component. The cutting jig was then adjusted to allow for 16 mm remaining width. Applied and then the coronal cut the posterior aspect of the patella was performed.Following cutting of the patella the width measured 14 mm.The trial 35 mm patella was chosen based on the size of the cut surface of patella. The 35 mm drill plate applied and drill holes placed through the posterior aspect of the patella. Trial reduction with the 35 mm trial and the leg placed through range of motion showed excellent motion full extension and flexion 135 patellar showed normal tracking appearance with flexion extension. No shuck noted then the knee was flexed 90 degrees and the knee was stable to varus and valgus at 0 30 and 60. Trial components were then removed and the knee was then irrigated with copious amounts of irrigant solution. A 35 mm permanent patella prosthesis brought onto the field  Cement was mixed the knee was subluxed and carefully soft tissues protected note that the lug holes in the distal femur where drill using the trial prosthesis and with the drill guide provided. With irrigation completed and permanent tibial components were then inserted into place using cement  and a semi-putty state over the proximal aspect of the tibia cement was also applied to the deep surface of the tibial component as well as over the posterior runners of the femur and the deep surface of the patella component. These were then carefully place excess cement removed about the circumference of the tibial  component. Then applying cement to the cut it distal aspect of the femur and posterior chamfer area anterior chamfer and anterior coronal cut surfaces small amount  within the box. Femoral component was then impacted into place excess cement removed amounts of circumference including the posterior chamfer area and the posterior femoral condyle area. The trial tibial peg then placed in the tibial component and a 10 mm rotating platform trial was inserted knee brought into full extension observed on the appeared to be in 3 of valgus with full extension 0 of hyperextension. Cement was then applied to the posterior cut surface of the patella within each of the patella peg openings  Patella component was then carefully aligned and inserted over the posterior aspect of the patella and clamped in place excess cement was then resected circumferentially using a freerl in order for to preserve cement the lateral facet area. Cement was allowed to fully harden tourniquet was released while cement was nearly completely hardened. Following this then irrigation was carried down careful inspection demonstrated some small bleeders present over the lateral posterior aspect of the knee joint cauterized using electrocautery off to the medial aspect and the areas of geniculate arteries. There is no active bleeding present then at that time. Trial 12.5 mm tibial insert however demonstrated tendency to allow for anterior drawer with the knee flexed to 90 in the retinaculum the clamped. Therefore a 15 mm trial was undertaken and this demonstrated much better improvement in stability of the anterior drawer and negative and the knee stable to varus valgus stress and 30 and 60 flexion and full extension. A 15 mm rotating platform insert was then chosen this was applied to the tibia using the permanent implant removing the trial examining tibia and determined there was no evident residual cement remaining. Also examined posterior runners I posterior aspect of the femoral condyles for any residual cement none was present. The tibial insert in place the reduced placed through range of motion and full  extension flexion and 30 knee stable to varus stress at 0 30 and 60 anterior drawer negative. This component was accepted. A Hemovac drain was placed in the depths the incision and the patient then had the deeper or something the retinaculum reapproximated over the medial aspect of the knee is Vicryl sutures. Note that a synovectomy was carried out over the suprapatellar pouch the anterior aspect of the knee joint and the initial closure of the knee and this material was sent for pathology evaluation.  The retinaculum of the knee and the quadriceps tendon were then reapproximated with interrupted #1 Vicryl sutures. Peritenon of the reapproximated with interrupted 0 Vicryl sutures deep subcutaneous layers approximated with interrupted 0 and 2-0 Vicryl sutures and the skin closed with a running subcutaneous stitch of 4-0 Vicryl. Dermabond was then applied. Adaptic 4 x 4's ABDs pads fixed to the skin with sterile labrum an Ace wrap applied from the foot to the right upper thigh and knee immobilizer. All instrument and sponge counts were correct. Then reactivated extubated and returned to recovery room in satisfactory condition.   Phillips Hay PA-C perform the duties of assistant surgeon she performed careful retraction of soft  tissues assisted in addition the patient had removal on the OR table is present beginning the case the case and performed closure of the incision from the peritenon to the skin and application of dressing.      Jackelyne Sayer E 07/18/2013, 3:48 PM

## 2013-07-18 NOTE — Progress Notes (Signed)
Orthopedic Tech Progress Note Patient Details:  Patricia Sutton June 26, 1945 [id number] CPM applied to Right LE with appropriate settings. OHF applied to bed.  CPM Right Knee CPM Right Knee: On Right Knee Flexion (Degrees): 60 Right Knee Extension (Degrees): 0   Asia R Thompson 07/18/2013, 4:47 PM

## 2013-07-18 NOTE — Anesthesia Preprocedure Evaluation (Addendum)
Anesthesia Evaluation   Patient awake    Reviewed: Allergy & Precautions, H&P , NPO status   History of Anesthesia Complications (+) PONV  Airway Mallampati: I  Neck ROM: Full    Dental  (+) Edentulous Upper, Edentulous Lower, Dental Advisory Given   Pulmonary sleep apnea , pneumonia -, COPDCurrent Smoker,          Cardiovascular + PND     Neuro/Psych Anxiety Depression    GI/Hepatic GERD-  ,  Endo/Other  Hypothyroidism   Renal/GU      Musculoskeletal   Abdominal   Peds  Hematology   Anesthesia Other Findings   Reproductive/Obstetrics                         Anesthesia Physical Anesthesia Plan  ASA: II  Anesthesia Plan: General   Post-op Pain Management:    Induction: Intravenous  Airway Management Planned: Oral ETT  Additional Equipment:   Intra-op Plan:   Post-operative Plan: Extubation in OR  Informed Consent: I have reviewed the patients History and Physical, chart, labs and discussed the procedure including the risks, benefits and alternatives for the proposed anesthesia with the patient or authorized representative who has indicated his/her understanding and acceptance.     Plan Discussed with: CRNA, Anesthesiologist and Surgeon  Anesthesia Plan Comments:         Anesthesia Quick Evaluation

## 2013-07-18 NOTE — Transfer of Care (Signed)
Immediate Anesthesia Transfer of Care Note  Patient: Patricia Sutton  Procedure(s) Performed: Procedure(s): COMPUTER ASSISTED RIGHT TOTAL KNEE ARTHROPLASTY (Right)  Patient Location: PACU  Anesthesia Type:General and Regional  Level of Consciousness: awake, alert  and oriented  Airway & Oxygen Therapy: Patient Spontanous Breathing and Patient connected to nasal cannula oxygen  Post-op Assessment: Report given to PACU RN and Post -op Vital signs reviewed and stable  Post vital signs: Reviewed and stable  Complications: No apparent anesthesia complications

## 2013-07-18 NOTE — Preoperative (Signed)
Beta Blockers   Reason not to administer Beta Blockers:Not Applicable 

## 2013-07-18 NOTE — Progress Notes (Signed)
Utilization review completed.  

## 2013-07-18 NOTE — Plan of Care (Signed)
Problem: Consults Goal: Diagnosis- Total Joint Replacement Primary Total Knee Right     

## 2013-07-18 NOTE — Brief Op Note (Signed)
07/18/2013  3:47 PM  PATIENT:  Patricia Sutton  68 y.o. female  PRE-OPERATIVE DIAGNOSIS:  Right knee arthritis  POST-OPERATIVE DIAGNOSIS:  Right knee arthritis  PROCEDURE:  Procedure(s): COMPUTER ASSISTED RIGHT TOTAL KNEE ARTHROPLASTY (Right)  SURGEON:  Surgeon(s) and Role:    * Kerrin ChampagneJames E Jahkari Maclin, MD - Primary  PHYSICIAN ASSISTANT: Maud DeedSheila Vernon, PA-C  ANESTHESIA:   regional and general  EBL:  Total I/O In: 1000 [I.V.:1000] Out: 425 [Urine:300; Blood:125]  BLOOD ADMINISTERED:none  DRAINS: Urinary Catheter (Foley)   LOCAL MEDICATIONS USED:  NONE  SPECIMEN:  No Specimen  DISPOSITION OF SPECIMEN:  N/A  COUNTS:  YES  TOURNIQUET:   Total Tourniquet Time Documented: Thigh (Right) - 89 minutes Total: Thigh (Right) - 89 minutes   DICTATION: .Reubin Milanragon Dictation  PLAN OF CARE: Admit to inpatient   PATIENT DISPOSITION:  PACU - hemodynamically stable.   Delay start of Pharmacological VTE agent (>24hrs) due to surgical blood loss or risk of bleeding: no

## 2013-07-19 LAB — CBC
HCT: 29.4 % — ABNORMAL LOW (ref 36.0–46.0)
HEMOGLOBIN: 10.3 g/dL — AB (ref 12.0–15.0)
MCH: 30.8 pg (ref 26.0–34.0)
MCHC: 35 g/dL (ref 30.0–36.0)
MCV: 88 fL (ref 78.0–100.0)
PLATELETS: 432 10*3/uL — AB (ref 150–400)
RBC: 3.34 MIL/uL — AB (ref 3.87–5.11)
RDW: 15.7 % — AB (ref 11.5–15.5)
WBC: 10.7 10*3/uL — AB (ref 4.0–10.5)

## 2013-07-19 MED ORDER — VENLAFAXINE HCL ER 150 MG PO CP24: 150.0000 mg | ORAL_CAPSULE | Freq: Every day | ORAL | Status: DC

## 2013-07-19 MED ORDER — VENLAFAXINE HCL ER 37.5 MG PO CP24: 37.5000 mg | ORAL_CAPSULE | Freq: Every day | ORAL | Status: DC

## 2013-07-19 MED ORDER — WHITE PETROLATUM GEL
Status: AC
  Administered 2013-07-19: 0.2
  Filled 2013-07-19: qty 5

## 2013-07-19 MED ORDER — VENLAFAXINE HCL ER 37.5 MG PO CP24
187.5000 mg | ORAL_CAPSULE | Freq: Every day | ORAL | Status: DC
  Administered 2013-07-20 – 2013-07-21 (×2): 187.5 mg via ORAL
  Filled 2013-07-19 (×4): qty 1

## 2013-07-19 NOTE — Evaluation (Signed)
Physical Therapy Evaluation Patient Details Name: Patricia Sutton MRN: [id number] DOB: 1945-09-17 Today's Date: 07/19/2013 Time: [id number] PT Time Calculation (min): 22 min  PT Assessment / Plan / Recommendation History of Present Illness  Pt underwent R TKA yesterday, 07/18/2013.  Clinical Impression  Pt is s/p TKA resulting in the deficits listed below (see PT Problem List).  Pt will benefit from skilled PT to increase their independence and safety with mobility to allow discharge to the venue listed below. Pt plans to d/c to grandson's condo and will have 24-hour family assist.  She will need a RW and HHPT upon d/c.     PT Assessment  Patient needs continued PT services    Follow Up Recommendations  Home health PT;Supervision/Assistance - 24 hour    Does the patient have the potential to tolerate intense rehabilitation      Barriers to Discharge        Equipment Recommendations  Rolling walker with 5" wheels    Recommendations for Other Services     Frequency 7X/week    Precautions / Restrictions Precautions Precautions: Knee Required Braces or Orthoses: Knee Immobilizer - Right Knee Immobilizer - Right: On except when in CPM Restrictions RLE Weight Bearing: Weight bearing as tolerated   Pertinent Vitals/Pain 8/10      Mobility  Bed Mobility Overal bed mobility: Modified Independent General bed mobility comments: with rails Transfers Overall transfer level: Needs assistance Equipment used: Rolling walker (2 wheeled) Transfers: Sit to/from Stand Sit to Stand: Min assist General transfer comment: verbal cues for hand placement and safety Ambulation/Gait Ambulation/Gait assistance: Min guard Ambulation Distance (Feet): 150 Feet Assistive device: Rolling walker (2 wheeled) Gait Pattern/deviations: Step-to pattern;Antalgic Gait velocity: verbal cues to decrease speed for safety    Exercises Total Joint Exercises Ankle Circles/Pumps: AROM;Both;10  reps Goniometric ROM: 5-80 degrees R knee AAROM in supine   PT Diagnosis: Difficulty walking;Acute pain  PT Problem List: Decreased strength;Decreased range of motion;Decreased activity tolerance;Decreased mobility;Decreased balance;Decreased safety awareness;Pain;Decreased knowledge of use of DME PT Treatment Interventions: DME instruction;Gait training;Stair training;Functional mobility training;Therapeutic activities;Therapeutic exercise;Patient/family education;Balance training     PT Goals(Current goals can be found in the care plan section) Acute Rehab PT Goals Patient Stated Goal: home PT Goal Formulation: With patient Time For Goal Achievement: 07/26/13 Potential to Achieve Goals: Good  Visit Information  Last PT Received On: 07/19/13 Assistance Needed: +1 History of Present Illness: Pt underwent R TKA yesterday, 07/18/2013.       Prior Functioning  Home Living Family/patient expects to be discharged to:: Private residence Living Arrangements: Children Available Help at Discharge: Family;Available 24 hours/day Type of Home: Apartment Home Access: Stairs to enter Entrance Stairs-Number of Steps: 12 Entrance Stairs-Rails: Right;Left;Can reach both Home Layout: One level Home Equipment: None Prior Function Level of Independence: Independent Communication Communication: No difficulties    Cognition  Cognition Arousal/Alertness: Awake/alert Behavior During Therapy: WFL for tasks assessed/performed Overall Cognitive Status: Within Functional Limits for tasks assessed    Extremity/Trunk Assessment     Balance    End of Session PT - End of Session Equipment Utilized During Treatment: Gait belt;Right knee immobilizer Activity Tolerance: Patient tolerated treatment well Patient left: in chair;with family/visitor present;with call bell/phone within reach Nurse Communication: Mobility status;Patient requests pain meds  GP     Ilda FoilGarrow, Patricia Sutton 07/19/2013, 9:32  AM  Aida RaiderWendy Telicia Sutton, PT  Office # [phone] Pager [phone]

## 2013-07-19 NOTE — Evaluation (Signed)
Occupational Therapy Evaluation Patient Details Name: Patricia Sutton MRN: [id number] DOB: 1946/05/16 Today's Date: 07/19/2013 Time: [id number] OT Time Calculation (min): 28 min  OT Assessment / Plan / Recommendation History of present illness Pt underwent R TKA yesterday, 07/18/2013.   Clinical Impression   Pt admitted with above. Will continue to follow acutely in order to address below problem list in prep for return home.    OT Assessment  Patient needs continued OT Services    Follow Up Recommendations  No OT follow up;Supervision/Assistance - 24 hour    Barriers to Discharge      Equipment Recommendations  3 in 1 bedside comode;Tub/shower seat    Recommendations for Other Services    Frequency  Min 2X/week    Precautions / Restrictions Precautions Precautions: Knee Required Braces or Orthoses: Knee Immobilizer - Right Knee Immobilizer - Right: On except when in CPM Restrictions RLE Weight Bearing: Weight bearing as tolerated   Pertinent Vitals/Pain See vitals    ADL  Eating/Feeding: Performed;Independent Where Assessed - Eating/Feeding: Chair Lower Body Dressing: Performed;Minimal assistance Where Assessed - Lower Body Dressing: Unsupported sit to stand Toilet Transfer: Simulated;Min guard Toilet Transfer Method: Sit to [place] Toilet Transfer Equipment:  (chair, bed) Equipment Used: Knee Immobilizer;Gait belt;Rolling walker Transfers/Ambulation Related to ADLs: Min guard with RW. Frequent VCs to slow down and for safe use of RW. ADL Comments: Pt requiring min assist during LB dressing in order to thread RLE through clothing and to doff right sock.      OT Diagnosis: Generalized weakness;Acute pain  OT Problem List: Decreased strength;Decreased activity tolerance;Impaired balance (sitting and/or standing);Decreased knowledge of use of DME or AE;Decreased knowledge of precautions;Pain OT Treatment Interventions: Self-care/ADL training;DME and/or AE  instruction;Therapeutic activities;Patient/family education;Balance training   OT Goals(Current goals can be found in the care plan section) Acute Rehab OT Goals Patient Stated Goal: home OT Goal Formulation: With patient Time For Goal Achievement: 07/26/13 Potential to Achieve Goals: Good  Visit Information  Last OT Received On: 07/19/13 Assistance Needed: +1 History of Present Illness: Pt underwent R TKA yesterday, 07/18/2013.       Prior Functioning     Home Living Family/patient expects to be discharged to:: Private residence Living Arrangements: Children Available Help at Discharge: Family;Available 24 hours/day Type of Home: Apartment Home Access: Stairs to enter Entrance Stairs-Number of Steps: 12 Entrance Stairs-Rails: Right;Left;Can reach both Home Layout: One level Home Equipment: None Prior Function Level of Independence: Independent         Vision/Perception     Cognition  Cognition Arousal/Alertness: Awake/alert Behavior During Therapy: WFL for tasks assessed/performed Overall Cognitive Status: Within Functional Limits for tasks assessed    Extremity/Trunk Assessment Upper Extremity Assessment Upper Extremity Assessment: Overall WFL for tasks assessed     Mobility Bed Mobility Overal bed mobility: Modified Independent Transfers Overall transfer level: Needs assistance Equipment used: Rolling walker (2 wheeled) Transfers: Sit to/from Stand Sit to Stand: Min guard General transfer comment: verbal cues for hand placement and safety     Exercise    Balance     End of Session OT - End of Session Equipment Utilized During Treatment: Gait belt;Rolling walker;Right knee immobilizer Activity Tolerance: Patient tolerated treatment well Patient left: in bed;with call bell/phone within reach;with nursing/sitter in room Nurse Communication: Mobility status   GO   07/19/2013 Cipriano MileJohnson, Jenna Elizabeth OTR/L Pager [phone] Office  [phone]   Cipriano MileJohnson, Jenna Elizabeth 07/19/2013, 3:10 PM

## 2013-07-19 NOTE — Progress Notes (Signed)
Patient ID: [name], female   DOB: 09/09/1945, 68 y.o.   MRN: [id number] Subjective: 1 Day Post-Op Procedure(s) (LRB): COMPUTER ASSISTED RIGHT TOTAL KNEE ARTHROPLASTY (Right) Awake, alert and oriented x 4. Up to sink to make up she has walked short distances with PT this AM. Patient reports pain as moderate.    Objective:   VITALS:  Temp:  [97.2 F (36.2 C)-100 F (37.8 C)] 98.6 F (37 C) (02/21 0616) Pulse Rate:  [40-173] 82 (02/21 0616) Resp:  [10-34] 20 (02/21 0800) BP: (120-160)/(50-125) 130/80 mmHg (02/21 0616) SpO2:  [94 %-100 %] 98 % (02/21 0800) FiO2 (%):  [2 %] 2 % (02/20 1815)  Neurologically intact ABD soft Neurovascular intact Sensation intact distally Dorsiflexion/Plantar flexion intact Incision: dressing C/D/I   LABS  Recent Labs  07/16/13 1353 07/19/13 0440  HGB 12.8 10.3*  WBC 9.6 10.7*  PLT 327 432*    Recent Labs  07/16/13 1353  NA 141  K 3.9  CL 100  CO2 27  BUN 16  CREATININE 0.88  GLUCOSE 172*    Recent Labs  07/16/13 1353  INR 0.96     Assessment/Plan: 1 Day Post-Op Procedure(s) (LRB): COMPUTER ASSISTED RIGHT TOTAL KNEE ARTHROPLASTY (Right)  Advance diet Up with therapy D/C IV fluids Likely home on Monday with HHN. NITKA,JAMES E 07/19/2013, 11:28 AM

## 2013-07-19 NOTE — Progress Notes (Signed)
Physical Therapy Treatment Patient Details Name: Patricia Sutton CommentVicky D Dufault MRN: [id number] DOB: 01/26/1946 Today's Date: 07/19/2013 Time: [id number] PT Time Calculation (min): 24 min  PT Assessment / Plan / Recommendation  History of Present Illness     PT Comments   Pt gait distance limited by fatigue this afternoon.  Follow Up Recommendations  Home health PT;Supervision/Assistance - 24 hour     Does the patient have the potential to tolerate intense rehabilitation     Barriers to Discharge        Equipment Recommendations  Rolling walker with 5" wheels    Recommendations for Other Services    Frequency 7X/week   Progress towards PT Goals Progress towards PT goals: Progressing toward goals  Plan Current plan remains appropriate    Precautions / Restrictions Precautions Precautions: Knee Required Braces or Orthoses: Knee Immobilizer - Right Knee Immobilizer - Right: On except when in CPM Restrictions RLE Weight Bearing: Weight bearing as tolerated   Pertinent Vitals/Pain     Mobility  Bed Mobility Overal bed mobility: Modified Independent Transfers Equipment used: Rolling walker (2 wheeled) Sit to Stand: Min assist Ambulation/Gait Ambulation/Gait assistance: Min guard Ambulation Distance (Feet): 25 Feet Assistive device: Rolling walker (2 wheeled) Gait Pattern/deviations: Step-to pattern;Antalgic Gait velocity: vc's for safety awareness    Exercises Total Joint Exercises Ankle Circles/Pumps: AROM;Both;10 reps Quad Sets: AROM;Right;10 reps Heel Slides: AROM;Right;10 reps Hip ABduction/ADduction: AROM;Right;10 reps   PT Diagnosis:    PT Problem List:   PT Treatment Interventions:     PT Goals (current goals can now be found in the care plan section)    Visit Information  Last PT Received On: 07/19/13 Assistance Needed: +1    Subjective Data      Cognition  Cognition Arousal/Alertness: Lethargic;Suspect due to medications Behavior During Therapy: [facility] for  tasks assessed/performed Overall Cognitive Status: Within Functional Limits for tasks assessed    Balance     End of Session PT - End of Session Equipment Utilized During Treatment: Gait belt;Right knee immobilizer Activity Tolerance: Patient limited by fatigue Patient left: in bed;with call bell/phone within reach;with family/visitor present;in CPM CPM Right Knee CPM Right Knee: On Right Knee Flexion (Degrees): 60 Right Knee Extension (Degrees): 0   GP     Ilda FoilGarrow, Mackensey Bolte Rene 07/19/2013, 2:50 PM  Aida RaiderWendy Rechel Delosreyes, PT  Office # [phone] Pager [phone]

## 2013-07-20 MED ORDER — OXYCODONE HCL 5 MG PO TABS: 5.0000 mg | ORAL_TABLET | ORAL | Status: DC | PRN

## 2013-07-20 MED ORDER — METHOCARBAMOL 500 MG PO TABS: 500.0000 mg | ORAL_TABLET | Freq: Three times a day (TID) | ORAL | Status: DC | PRN

## 2013-07-20 MED ORDER — ASPIRIN 325 MG PO TBEC: 325.0000 mg | DELAYED_RELEASE_TABLET | Freq: Two times a day (BID) | ORAL | Status: DC

## 2013-07-20 MED ORDER — OXYCODONE HCL 15 MG PO TABS: 15.0000 mg | ORAL_TABLET | Freq: Four times a day (QID) | ORAL | Status: DC | PRN

## 2013-07-20 NOTE — Progress Notes (Signed)
Physical Therapy Treatment Patient Details Name: Patricia Sutton Today's Date: 07/20/2013 Time: [id number] PT Time Calculation (min): 30 min  PT Assessment / Plan / Recommendation  History of Present Illness     PT Comments   Progressing well.  Plan for d/c home tomorrow.  PT to address stair training this PM.  Follow Up Recommendations  Home health PT;Supervision/Assistance - 24 hour     Does the patient have the potential to tolerate intense rehabilitation     Barriers to Discharge        Equipment Recommendations  Rolling walker with 5" wheels    Recommendations for Other Services    Frequency 7X/week   Progress towards PT Goals Progress towards PT goals: Progressing toward goals  Plan Current plan remains appropriate    Precautions / Restrictions Precautions Precautions: Knee Required Braces or Orthoses: Knee Immobilizer - Right Knee Immobilizer - Right: On except when in CPM Restrictions Weight Bearing Restrictions: Yes RLE Weight Bearing: Weight bearing as tolerated   Pertinent Vitals/Pain 6/10    Mobility  Bed Mobility Overal bed mobility: Modified Independent General bed mobility comments: with rails Transfers Overall transfer level: Needs assistance Equipment used: Rolling walker (2 wheeled) Sit to Stand: Min guard General transfer comment: vc's for safety Ambulation/Gait Ambulation/Gait assistance: Min guard Ambulation Distance (Feet): 250 Feet Assistive device: Rolling walker (2 wheeled) Gait Pattern/deviations: Step-to pattern;Antalgic    Exercises Total Joint Exercises Ankle Circles/Pumps: AROM;Both;10 reps Quad Sets: AROM;Both;10 reps Heel Slides: AROM;Right;10 reps Hip ABduction/ADduction: AROM;Right;10 reps Straight Leg Raises: AROM;Right;5 reps Goniometric ROM: 0-90 R knee AAROM in supine   PT Diagnosis:    PT Problem List:   PT Treatment Interventions:     PT Goals (current goals can now be found in the  care plan section)    Visit Information  Last PT Received On: 07/20/13 Assistance Needed: +1    Subjective Data      Cognition  Cognition Arousal/Alertness: Awake/alert Behavior During Therapy: WFL for tasks assessed/performed Overall Cognitive Status: Within Functional Limits for tasks assessed    Balance     End of Session PT - End of Session Equipment Utilized During Treatment: Gait belt;Right knee immobilizer Activity Tolerance: Patient tolerated treatment well Patient left: in chair;with family/visitor present;with call bell/phone within reach Nurse Communication: Mobility status CPM Right Knee CPM Right Knee: On Right Knee Flexion (Degrees): 65   GP     Ilda FoilGarrow, Venisa Frampton Rene 07/20/2013, 11:48 AM  Aida RaiderWendy Jaquel Glassburn, PT  Office # [phone] Pager [phone]

## 2013-07-20 NOTE — Progress Notes (Signed)
Patient ID: Patricia Sutton, female   DOB: Apr 21, 1946, 68 y.o.   MRN: [id number] Subjective: 2 Days Post-Op Procedure(s) (LRB): COMPUTER ASSISTED RIGHT TOTAL KNEE ARTHROPLASTY (Right) Awake, alert and oriented x 4. Pain not totally relieved by meds by mouth. Has stairs At home to negotiate. Tolerating po s. Patient reports pain as marked.    Objective:   VITALS:  Temp:  [99.2 F (37.3 C)-100.2 F (37.9 C)] 99.2 F (37.3 C) (02/22 0512) Pulse Rate:  [63-88] 63 (02/22 0512) Resp:  [18-20] 20 (02/22 0512) BP: (105-118)/(41-85) 118/85 mmHg (02/22 0512) SpO2:  [95 %-98 %] 95 % (02/22 0512)  Neurologically intact ABD soft Neurovascular intact Sensation intact distally Intact pulses distally Dorsiflexion/Plantar flexion intact Incision: moderate drainage Compartment soft   LABS  Recent Labs  07/19/13 0440  HGB 10.3*  WBC 10.7*  PLT 432*   No results found for this basename: NA, K, CL, CO2, BUN, CREATININE, GLUCOSE,  in the last 72 hours No results found for this basename: LABPT, INR,  in the last 72 hours   Assessment/Plan: 2 Days Post-Op Procedure(s) (LRB): COMPUTER ASSISTED RIGHT TOTAL KNEE ARTHROPLASTY (Right)  Advance diet Up with therapy Plan for discharge tomorrow Will write meds and prepare for d/c.  Patricia Sutton 07/20/2013, 9:53 AM

## 2013-07-20 NOTE — Progress Notes (Signed)
Clinical Education officer, museum (CSW) received consult for SNF placement. Per PT patient can go home with home health services. CSW met with patient who reported that she is going to stay at her grandson's house after she leaves the hospital. CSW left message for RN case manager making her aware of above. Please reconsult if further social work needs arise. CSW signing off.   Blima Rich, [place] Weekend CSW [phone]

## 2013-07-20 NOTE — Progress Notes (Signed)
Physical Therapy Treatment Patient Details Name: Marin CommentVicky D Doucette MRN: [id number] DOB: [birth year] Today's Date: 07/20/2013 Time: [id number] PT Time Calculation (min): 10 min  PT Assessment / Plan / Recommendation  History of Present Illness     PT Comments   Plan is for d/c home tomorrow.  Pt needs to complete stair training.  Follow Up Recommendations  Home health PT;Supervision/Assistance - 24 hour     Does the patient have the potential to tolerate intense rehabilitation     Barriers to Discharge        Equipment Recommendations  Rolling walker with 5" wheels    Recommendations for Other Services    Frequency 7X/week   Progress towards PT Goals Progress towards PT goals: Progressing toward goals  Plan Current plan remains appropriate    Precautions / Restrictions Precautions Precautions: Knee Required Braces or Orthoses: Knee Immobilizer - Right Knee Immobilizer - Right: On except when in CPM Restrictions RLE Weight Bearing: Weight bearing as tolerated   Pertinent Vitals/Pain 10/10    Mobility  Bed Mobility Overal bed mobility: Modified Independent General bed mobility comments: with rails Transfers Overall transfer level: Needs assistance Equipment used: Rolling walker (2 wheeled) Sit to Stand: Min guard General transfer comment: vc's for safety Ambulation/Gait Ambulation/Gait assistance: Min guard Ambulation Distance (Feet): 250 Feet Assistive device: Rolling walker (2 wheeled) Gait Pattern/deviations: Step-to pattern;Antalgic    Exercises Total Joint Exercises Ankle Circles/Pumps: AROM;Both;10 reps Quad Sets: AROM;Both;10 reps Heel Slides: AROM;Right;10 reps Hip ABduction/ADduction: AROM;Right;10 reps Straight Leg Raises: AROM;Right;5 reps Goniometric ROM: 0-90 R knee AAROM in supine   PT Diagnosis:    PT Problem List:   PT Treatment Interventions:     PT Goals (current goals can now be found in the care plan section)    Visit Information  Last  PT Received On: 07/20/13 Assistance Needed: +1    Subjective Data      Cognition  Cognition Arousal/Alertness: Awake/alert Behavior During Therapy: [facility] for tasks assessed/performed Overall Cognitive Status: Within Functional Limits for tasks assessed    Balance     End of Session PT - End of Session Equipment Utilized During Treatment: Gait belt;Right knee immobilizer Activity Tolerance: Patient limited by pain Patient left: in bed;in CPM;with call bell/phone within reach Nurse Communication: Patient requests pain meds CPM Right Knee CPM Right Knee: On Right Knee Flexion (Degrees): 65 Right Knee Extension (Degrees): 0   GP     Ilda FoilGarrow, Erline Siddoway Rene 07/20/2013, 3:37 PM  Aida RaiderWendy Rodney Wigger, PT  Office # [phone] Pager [phone]

## 2013-07-21 ENCOUNTER — Encounter (HOSPITAL_COMMUNITY): Payer: Self-pay | Admitting: Specialist

## 2013-07-21 MED ORDER — FLUCONAZOLE 150 MG PO TABS
150.0000 mg | ORAL_TABLET | Freq: Every day | ORAL | Status: DC
  Administered 2013-07-21: 150 mg via ORAL
  Filled 2013-07-21: qty 1

## 2013-07-21 MED ORDER — MAGNESIUM CITRATE PO SOLN
1.0000 | Freq: Once | ORAL | Status: AC
  Administered 2013-07-21: 1 via ORAL
  Filled 2013-07-21: qty 296

## 2013-07-21 NOTE — Progress Notes (Signed)
Physical Therapy Treatment Patient Details Name: Patricia Sutton MRN: [id number] DOB: 1945/11/17 Today's Date: 07/21/2013 Time: [id number] PT Time Calculation (min): 33 min  PT Assessment / Plan / Recommendation  History of Present Illness Pt underwent R TKA, 07/18/2013.   PT Comments   Pt progressing well towards physical therapy goals. Discussed HEP handout and car transfer with pt and husband. Pt was able to improve gait pattern today with increase in gait distance with no complaints of increased pain. Pt and husband anticipate d/c home today.   Follow Up Recommendations  Home health PT;Supervision for mobility/OOB     Does the patient have the potential to tolerate intense rehabilitation     Barriers to Discharge        Equipment Recommendations  Rolling walker with 5" wheels    Recommendations for Other Services    Frequency 7X/week   Progress towards PT Goals Progress towards PT goals: Progressing toward goals  Plan Current plan remains appropriate    Precautions / Restrictions Precautions Precautions: Knee;Fall Precaution Comments: Discussed towel roll under heel and NO pillow under knee Required Braces or Orthoses: Knee Immobilizer - Right Knee Immobilizer - Right: On except when in CPM Restrictions Weight Bearing Restrictions: Yes RLE Weight Bearing: Weight bearing as tolerated   Pertinent Vitals/Pain Pt states her pain level is 4/10 after ambulation and stair training.    Mobility  Bed Mobility General bed mobility comments: NT - pt received sitting in recliner Transfers Overall transfer level: Needs assistance Equipment used: Rolling walker (2 wheeled) Transfers: Sit to/from Stand Sit to Stand: Supervision General transfer comment: Pt demonstrated proper hand placement and safety awareness. Ambulation/Gait Ambulation/Gait assistance: Min guard;Supervision Ambulation Distance (Feet): 500 Feet Assistive device: Rolling walker (2 wheeled) Gait  Pattern/deviations: Step-to pattern;Step-through pattern;Decreased stride length Gait velocity: Decreased Gait velocity interpretation: Below normal speed for age/gender General Gait Details: VC's for step-through gait pattern and fluidity of walker movement.  Stair Training: Pt was able to negotiate 5 steps ascending and descending x3 utilizing bilateral hand rails for support. VC's were provided for sequencing and safety awareness.     Exercises Total Joint Exercises Ankle Circles/Pumps: 10 reps Quad Sets: 10 reps Heel Slides: 10 reps Hip ABduction/ADduction: 10 reps Straight Leg Raises: 10 reps Long Arc Quad: 10 reps Knee Flexion: 5 reps Goniometric ROM: 0-97 AROM   PT Diagnosis:    PT Problem List:   PT Treatment Interventions:     PT Goals (current goals can now be found in the care plan section) Acute Rehab PT Goals Patient Stated Goal: home PT Goal Formulation: With patient Time For Goal Achievement: 07/26/13 Potential to Achieve Goals: Good  Visit Information  Last PT Received On: 07/21/13 Assistance Needed: +1 History of Present Illness: Pt underwent R TKA, 07/18/2013.    Subjective Data  Subjective: "I feel great, let's keep going." Patient Stated Goal: home   Cognition  Cognition Arousal/Alertness: Awake/alert Behavior During Therapy: WFL for tasks assessed/performed Overall Cognitive Status: Within Functional Limits for tasks assessed    Balance  Balance Overall balance assessment: No apparent balance deficits (not formally assessed)  End of Session PT - End of Session Equipment Utilized During Treatment: Gait belt;Right knee immobilizer Activity Tolerance: Patient tolerated treatment well Patient left: in chair;with call bell/phone within reach Nurse Communication: Mobility status   GP     Ruthann CancerHamilton, Brielyn Bosak 07/21/2013, 3:32 PM  Ruthann CancerLaura Hamilton, PT, DPT [phone]

## 2013-07-21 NOTE — Progress Notes (Signed)
Patient ID: Patricia [name], female   DOB: 1946/01/06, 68 y.o.   MRN: [id number] Subjective: 3 Days Post-Op Procedure(s) (LRB): COMPUTER ASSISTED RIGHT TOTAL KNEE ARTHROPLASTY (Right) Awake, alert and oriented x 4. Walking 250 feet with PT. OT discharged from inpt OT HHN to follow up at discharge. Patient reports pain as moderate.    Objective:   VITALS:  Temp:  [98.8 F (37.1 C)-99.8 F (37.7 C)] 98.8 F (37.1 C) (02/23 0641) Pulse Rate:  [70-75] 70 (02/23 0641) Resp:  [16-20] 18 (02/23 0800) BP: (105-110)/(43-60) 110/60 mmHg (02/23 0641) SpO2:  [94 %-98 %] 95 % (02/23 0641) Weight:  [58.65 kg (129 lb 4.8 oz)] 58.65 kg (129 lb 4.8 oz) (02/23 0300)  Neurologically intact ABD soft Neurovascular intact Sensation intact distally Intact pulses distally Dorsiflexion/Plantar flexion intact Incision: moderate drainage   LABS  Recent Labs  07/19/13 0440  HGB 10.3*  WBC 10.7*  PLT 432*   No results found for this basename: NA, K, CL, CO2, BUN, CREATININE, GLUCOSE,  in the last 72 hours No results found for this basename: LABPT, INR,  in the last 72 hours   Assessment/Plan: 3 Days Post-Op Procedure(s) (LRB): COMPUTER ASSISTED RIGHT TOTAL KNEE ARTHROPLASTY (Right)  Advance diet Up with therapy Discharge home with home health  Patricia Sutton E 07/21/2013, 11:47 AM

## 2013-07-21 NOTE — Care Management Note (Signed)
CARE MANAGEMENT NOTE 07/21/2013  Patient:  Patricia Sutton,Patricia Sutton   Account Number:  [id number]  Date Initiated:  07/21/2013  Documentation initiated by:  Vance PeperBRADY,Meghana Tullo  Subjective/Objective Assessment:   68 yr old female s/p right total knee arthroplasty.     Action/Plan:   Patient preoperatively setup with [facility], no changes. Rolling walker, 3in1 and CPM have been delivered to the home. Patient has family support at discharge.   Anticipated DC Date:  07/21/2013   Anticipated DC Plan:  HOME W HOME HEALTH SERVICES      DC Planning Services  CM consult      [facility]  DURABLE MEDICAL EQUIPMENT   Choice offered to / List presented to:  C-1 Patient      DME agency  [employer]     HH arranged  HH[facility] PT      [facility]   Status of service:  Completed, signed off Medicare Important Message given?   (If response is "NO", the following Medicare IM given date fields will be blank) Date Medicare IM given:   Date Additional Medicare IM given:    Discharge Disposition:  HOME W HOME HEALTH SERVICES  Per UR Regulation:    If discussed at Long Length of Stay Meetings, dates discussed:    Comments:  07/21/13  Patientwill be going [address] her grandson[address]: [address], [place]

## 2013-07-21 NOTE — Progress Notes (Signed)
Subjective: 3 Days Post-Op Procedure(s) (LRB): COMPUTER ASSISTED RIGHT TOTAL KNEE ARTHROPLASTY (Right) Patient reports pain as mild.   Progressing well with activity.  Objective: Vital signs in last 24 hours: Temp:  [98.8 F (37.1 C)-99.8 F (37.7 C)] 98.8 F (37.1 C) (02/23 0641) Pulse Rate:  [70-75] 70 (02/23 0641) Resp:  [16-20] 18 (02/23 1152) BP: (105-110)/(43-60) 110/60 mmHg (02/23 0641) SpO2:  [94 %-98 %] 95 % (02/23 0641) Weight:  [58.65 kg (129 lb 4.8 oz)] 58.65 kg (129 lb 4.8 oz) (02/23 0300)  Intake/Output from previous day: 02/22 0701 - 02/23 0700 In: 720 [P.O.:720] Out: -  Intake/Output this shift: Total I/O In: 424 [P.O.:424] Out: -    Recent Labs  07/19/13 0440  HGB 10.3*    Recent Labs  07/19/13 0440  WBC 10.7*  RBC 3.34*  HCT 29.4*  PLT 432*   No results found for this basename: NA, K, CL, CO2, BUN, CREATININE, GLUCOSE, CALCIUM,  in the last 72 hours No results found for this basename: LABPT, INR,  in the last 72 hours  Neurovascular intact Sensation intact distally Intact pulses distally Dorsiflexion/Plantar flexion intact Incision: scant drainage Mid incision with bloody drainage.  Steri strips applied.  Will hold ROM to 90 flexion  Assessment/Plan: 3 Days Post-Op Procedure(s) (LRB): COMPUTER ASSISTED RIGHT TOTAL KNEE ARTHROPLASTY (Right) Discharge home with home health OV 1 week to check wound rx on chart. Change dressing daily or as needed.  Do not progress above 90 flexion of knee.  Maricsa Sammons M 07/21/2013, 12:00 PM

## 2013-07-21 NOTE — Discharge Instructions (Signed)
Keep knee incision dry for 5 days post op then may wet while bathing. DO NOT GET WET IF DRAINAGE FROM KNEE INCISION. Therapy daily and CPM goal full extension and greater than 90 degrees flexion. DO NOT ADVANCE BEYOND 90 DEGREES UNTIL SEEN BY DR NITKA IN OFFICE IN ONE WEEK Call if fever or chills or increased drainage. Go to ER if acutely short of breath or call for ambulance. Return for follow up in 2 weeks. May full weight bear on the surgical leg unless told otherwise. Use knee immobilizer until able to straight leg raise off bed with knee stable. In house walking for first 2 weeks.

## 2013-07-21 NOTE — Progress Notes (Signed)
Patient examined and lab reviewed with Vernon PA-C. 

## 2013-07-21 NOTE — Progress Notes (Signed)
D/C instructions and scripts given. Pts family at the bedside to take Pt home.

## 2013-07-21 NOTE — Progress Notes (Signed)
Occupational Therapy Treatment and Discharge Patient Details Name: Patricia Sutton MRN: [id number] DOB: 1946-03-22 Today's Date: 07/21/2013 Time: [id number] OT Time Calculation (min): 20 min  OT Assessment / Plan / Recommendation  History of present illness Pt underwent R TKA, 07/18/2013.   OT comments  This patient admitted and underwent above, all education completed. Discharge from acute OT.  Follow Up Recommendations  No OT follow up;Supervision/Assistance - 24 hour       Equipment Recommendations  3 in 1 bedside comode;Tub/shower bench       Frequency Min 2X/week   Progress towards OT Goals Progress towards OT goals: Progressing toward goals  Plan Discharge plan needs to be updated    Precautions / Restrictions Precautions Precautions: Knee Required Braces or Orthoses: Knee Immobilizer - Right Knee Immobilizer - Right: On except when in CPM Restrictions Weight Bearing Restrictions: No RLE Weight Bearing: Weight bearing as tolerated       ADL  Tub/Shower Transfer: Supervision/safety Tub/Shower Transfer Method: Science writerAmbulating Tub/Shower Transfer Equipment: CounsellorTransfer tub bench Equipment Used: Knee Immobilizer;Rolling walker Transfers/Ambulation Related to ADLs: S with RW and knee immobilizer for all      OT Goals(current goals can now be found in the care plan section)    Visit Information  Last OT Received On: 07/21/13 Assistance Needed: +1 History of Present Illness: Pt underwent R TKA, 07/18/2013.          Cognition  Cognition Arousal/Alertness: Awake/alert Behavior During Therapy: WFL for tasks assessed/performed Overall Cognitive Status: Within Functional Limits for tasks assessed    Mobility  Bed Mobility Overal bed mobility: Modified Independent General bed mobility comments: HOB up Transfers Overall transfer level: Needs assistance Equipment used: Rolling walker (2 wheeled) Transfers: Sit to/from Stand Sit to Stand: Supervision General transfer  comment: vc's for safe hand placement          End of Session OT - End of Session Equipment Utilized During Treatment: Rolling walker;Right knee immobilizer Activity Tolerance: Patient tolerated treatment well Patient left: in chair (with PT in rehab gym)       Rae LipsMiller, Shalaine Payson M [phone] 07/21/2013, 9:37 AM

## 2013-07-22 NOTE — Discharge Summary (Signed)
Physician Discharge Summary  Patient ID: Patricia Sutton MRN: [id number] DOB/AGE: 12-09-1945 68 y.o.  Admit date: 07/18/2013 Discharge date: 07/21/2013  Admission Diagnoses:  Principal Problem:   Osteoarthritis of right knee   Discharge Diagnoses:  Same  Past Medical History  Diagnosis Date  . COPD (chronic obstructive pulmonary disease)     denies SOB  . Sleep apnea     no CPAP; uses O2 at night 1l/min.  . Bone spur of other site     spine  . Insomnia     takes Trazodone nightly and Ambien  . Hypothyroidism     takes Synthroid daily  . PONV (postoperative nausea and vomiting)   . Pneumonia     hx of;last time about 6+yrs ago  . History of bronchitis     last time about 6+yrs ago  . Arthritis   . Joint pain   . Joint swelling   . Chronic back pain     spurs on spine  . GERD (gastroesophageal reflux disease)     takes Omeprazole daily  . History of colon polyps   . Urinary urgency   . History of kidney stones   . Nocturia   . Depression     takes Effexor daily  . Anxiety     Surgeries: Procedure(s): COMPUTER ASSISTED RIGHT TOTAL KNEE ARTHROPLASTY on 07/18/2013   Consultants:    Discharged Condition: Improved  Hospital Course: Patricia Sutton is an 68 y.o. female who was admitted 07/18/2013 with a chief complaint of No chief complaint on file. , and found to have a diagnosis of Osteoarthritis of right knee.  They were brought to the operating room on 07/18/2013 and underwent the above named procedures.    They were given perioperative antibiotics:  Anti-infectives   Start     Dose/Rate Route Frequency Ordered Stop   07/21/13 1130  fluconazole (DIFLUCAN) tablet 150 mg  Status:  Discontinued     150 mg Oral Daily 07/21/13 1037 07/21/13 1834   07/18/13 0600  ceFAZolin (ANCEF) IVPB 2 g/50 mL premix     2 g 100 mL/hr over 30 Minutes Intravenous On call to O.R. 07/17/13 1346 07/18/13 1327    .patient in the recovery room and demonstrated significant discomfort  and pain. She was placed on IV narcotic medicines as well as by mouth narcotic medicine. OxyIR 15 mg every 6 hours with breakthrough dose of 5 mg every 3 hours. Postoperative day #1 she was standing full weightbearing on right lower extremity demonstrating inability to lift the leg heel off of the bed. And will ambulate short distance. Foley discontinued. Postoperative day #2 dressing was changed small amount of drainage from the anterior distal one third of the incision site Mepilex bandage applied patient continue his therapy and was taught stair climbing. On postoperative day #3 occupational therapy discharge her she hasn't been able to demonstrate the ability to climb stairs and enter her domicile. Dressing change demonstrated a small area of spread of the skin over the distal middle one third of the incision site to Steri-Strips were applied to this area. Patient was taking tolerating by mouth narcotic medicines and regular diet. He is ambulating full weightbearing as tolerated with range of motion the right knee is 0-70. Patient was discharged home on postoperative day #3 with home health physical therapy. During her hospitalization she showed normal vital signs postoperatively hemoglobin minimally diminished postop with no fever no sign of cellulitis. Her medications are Effexor on postop day #1  was 75 mg in the morning. This was then increased her usual dose of Effexor ER 150 mg and a 37.5 mg tablet on postoperative day #2.  They were given sequential compression devices, early ambulation, and chemoprophylaxis for DVT prophylaxis.  They benefited maximally from their hospital stay and there were no complications.    Recent vital signs:  Filed Vitals:   07/21/13 1403  BP: 100/57  Pulse: 81  Temp: 98.3 F (36.8 C)  Resp: 20    Recent laboratory studies:  Results for orders placed during the hospital encounter of 07/18/13  GLUCOSE, CAPILLARY      Result Value Ref Range   Glucose-Capillary  116 (*) 70 - 99 mg/dL  CBC      Result Value Ref Range   WBC 10.7 (*) 4.0 - 10.5 K/uL   RBC 3.34 (*) 3.87 - 5.11 MIL/uL   Hemoglobin 10.3 (*) 12.0 - 15.0 g/dL   HCT 16.1 (*) 09.6 - 04.5 %   MCV 88.0  78.0 - 100.0 fL   MCH 30.8  26.0 - 34.0 pg   MCHC 35.0  30.0 - 36.0 g/dL   RDW 40.9 (*) 81.1 - 91.4 %   Platelets 432 (*) 150 - 400 K/uL    Discharge Medications:     Medication List         albuterol (2.5 MG/3ML) 0.083% nebulizer solution  Commonly known as:  PROVENTIL  Take 2.5 mg by nebulization every 4 (four) hours as needed for wheezing or shortness of breath.     aspirin 325 MG EC tablet  Take 1 tablet (325 mg total) by mouth 2 (two) times daily after a meal.     budesonide-formoterol 160-4.5 MCG/ACT inhaler  Commonly known as:  SYMBICORT  Inhale 2 puffs into the lungs 2 (two) times daily.     ciprofloxacin 500 MG tablet  Commonly known as:  CIPRO  Take 500 mg by mouth 2 (two) times daily. 3 day course filled 07/16/13     gabapentin 300 MG capsule  Commonly known as:  NEURONTIN  Take 600 mg by mouth 2 (two) times daily. At noon and at bedtime     levothyroxine 25 MCG tablet  Commonly known as:  SYNTHROID, LEVOTHROID  Take 25 mcg by mouth daily before breakfast.     lidocaine 2 % jelly  Commonly known as:  XYLOCAINE  Apply 1 application topically 2 (two) times daily as needed (pain). Apply 1/4 inch to knee as needed     methocarbamol 500 MG tablet  Commonly known as:  ROBAXIN  Take 1 tablet (500 mg total) by mouth every 8 (eight) hours as needed for muscle spasms.     omeprazole 20 MG capsule  Commonly known as:  PRILOSEC  Take 20 mg by mouth 2 (two) times daily.     oxyCODONE 5 MG immediate release tablet  Commonly known as:  Oxy IR/ROXICODONE  Take 1-2 tablets (5-10 mg total) by mouth every 3 (three) hours as needed for severe pain or breakthrough pain.     oxyCODONE 15 MG immediate release tablet  Commonly known as:  ROXICODONE  Take 1 tablet (15 mg  total) by mouth every 6 (six) hours as needed for pain (Baseline pain control).     promethazine 25 MG tablet  Commonly known as:  PHENERGAN  Take 25 mg by mouth 3 (three) times daily as needed for nausea or vomiting.     traZODone 100 MG tablet  Commonly known as:  DESYREL  Take 100 mg by mouth at bedtime.     venlafaxine XR 37.5 MG 24 hr capsule  Commonly known as:  EFFEXOR-XR  Take 37.5 mg by mouth daily.     venlafaxine XR 150 MG 24 hr capsule  Commonly known as:  EFFEXOR-XR  Take 150 mg by mouth daily.     VOLTAREN 1 % Gel  Generic drug:  diclofenac sodium  Apply 2-5 g topically 3 (three) times daily as needed (pain).        Diagnostic Studies: Dg Chest 2 View  07/16/2013   CLINICAL DATA:  Smoker.  COPD.  EXAM: CHEST  2 VIEW  COMPARISON:  04/08/2012.  FINDINGS: Mediastinum and hilar structures are normal. The lungs are clear. Heart size normal. No pleural effusion or pneumothorax. Degenerative changes thoracic spine.  IMPRESSION: No active cardiopulmonary disease.   Electronically Signed   By: Maisie Fus  Register   On: 07/16/2013 14:16    Disposition: 01-Home or Self Care      Discharge Orders   Future Orders Complete By Expires   Call MD / Call 911  As directed    Comments:     If you experience chest pain or shortness of breath, CALL 911 and be transported to the hospital emergency room.  If you develope a fever above 101 F, pus (white drainage) or increased drainage or redness at the wound, or calf pain, call your surgeon's office.   Constipation Prevention  As directed    Comments:     Drink plenty of fluids.  Prune juice may be helpful.  You may use a stool softener, such as Colace (over the counter) 100 mg twice a day.  Use MiraLax (over the counter) for constipation as needed.   CPM  As directed    Comments:     Continuous passive motion machine (CPM):      Use the CPM from 0 to 70 for 8 hours per day.      You may increase by 10 per day.  You may break it up  into 2 or 3 sessions per day.      Use CPM for 2 weeks or until you are told to stop.   Diet - low sodium heart healthy  As directed    Discharge instructions  As directed    Comments:     Keep knee incision dry for 5 days post op then may wet while bathing. DO NOT GET INCISION WET IF CONTINUED DRAINAGE Therapy daily and CPM goal full extension and greater than 90 degrees flexion. DO NOT FLEX BEYOND 90 DEGREES UNTIL SEEN IN OFFICE. Call if fever or chills or increased drainage. Go to ER if acutely short of breath or call for ambulance. Return for follow up in 2 weeks. May full weight bear on the surgical leg unless told otherwise. Use knee immobilizer until able to straight leg raise off bed with knee stable. In house walking for first 2 weeks.   Do not put a pillow under the knee. Place it under the heel.  As directed    Driving restrictions  As directed    Comments:     No driving for 4 weeks   Increase activity slowly as tolerated  As directed    Lifting restrictions  As directed    Comments:     No lifting for 6 weeks      Follow-up Information   Follow up with Ky Moskowitz E, MD In 1 week.  Specialty:  Orthopedic Surgery   Contact information:   [address] [place] [address] [phone]        Signed: Kerrin ChampagneITKA,Kinga Cassar E 07/22/2013, 7:34 AM

## 2013-08-10 IMAGING — CR DG CHEST 2V
2 series · 2 of 2 positions shown · non-contrast
Comparison: None.

CLINICAL DATA: Weakness.

CHEST - 2 VIEW

[w chest pa]
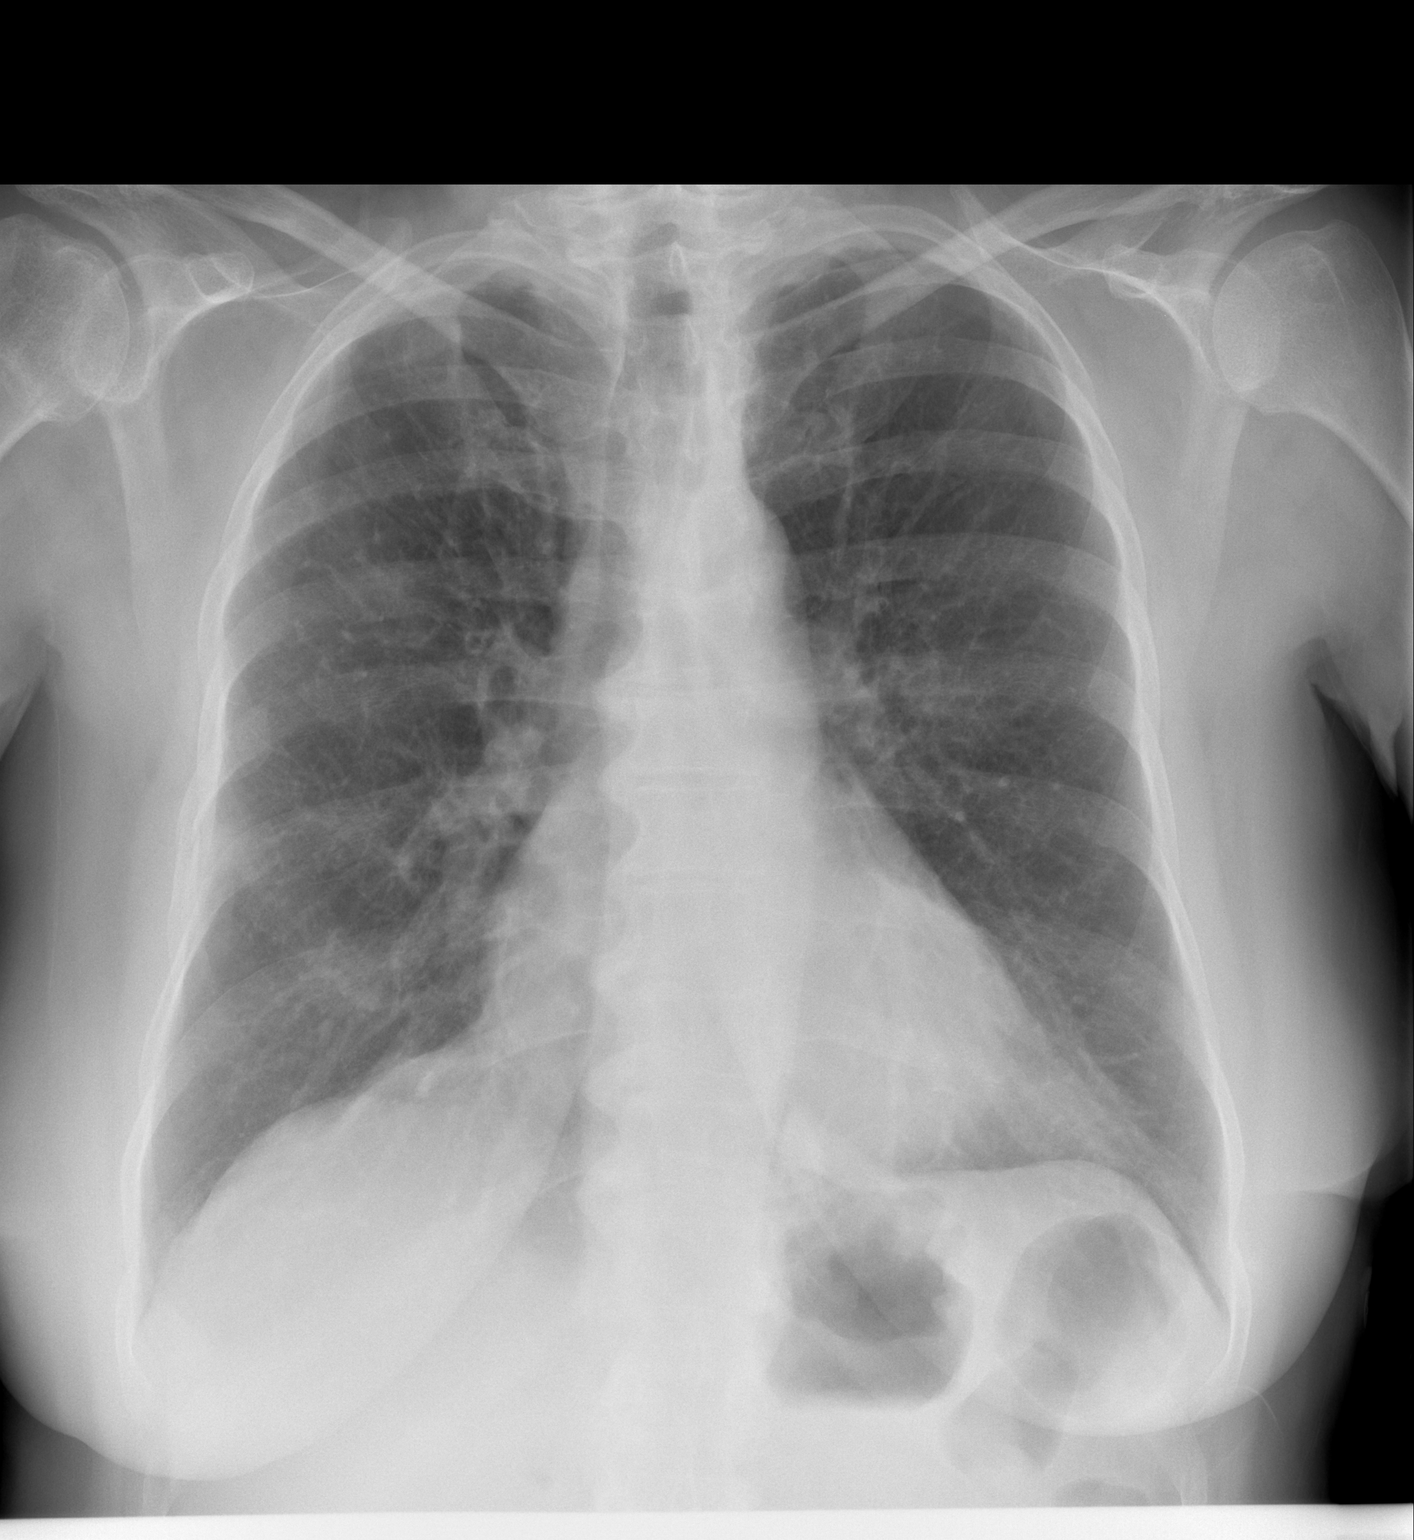

[w chest lat]
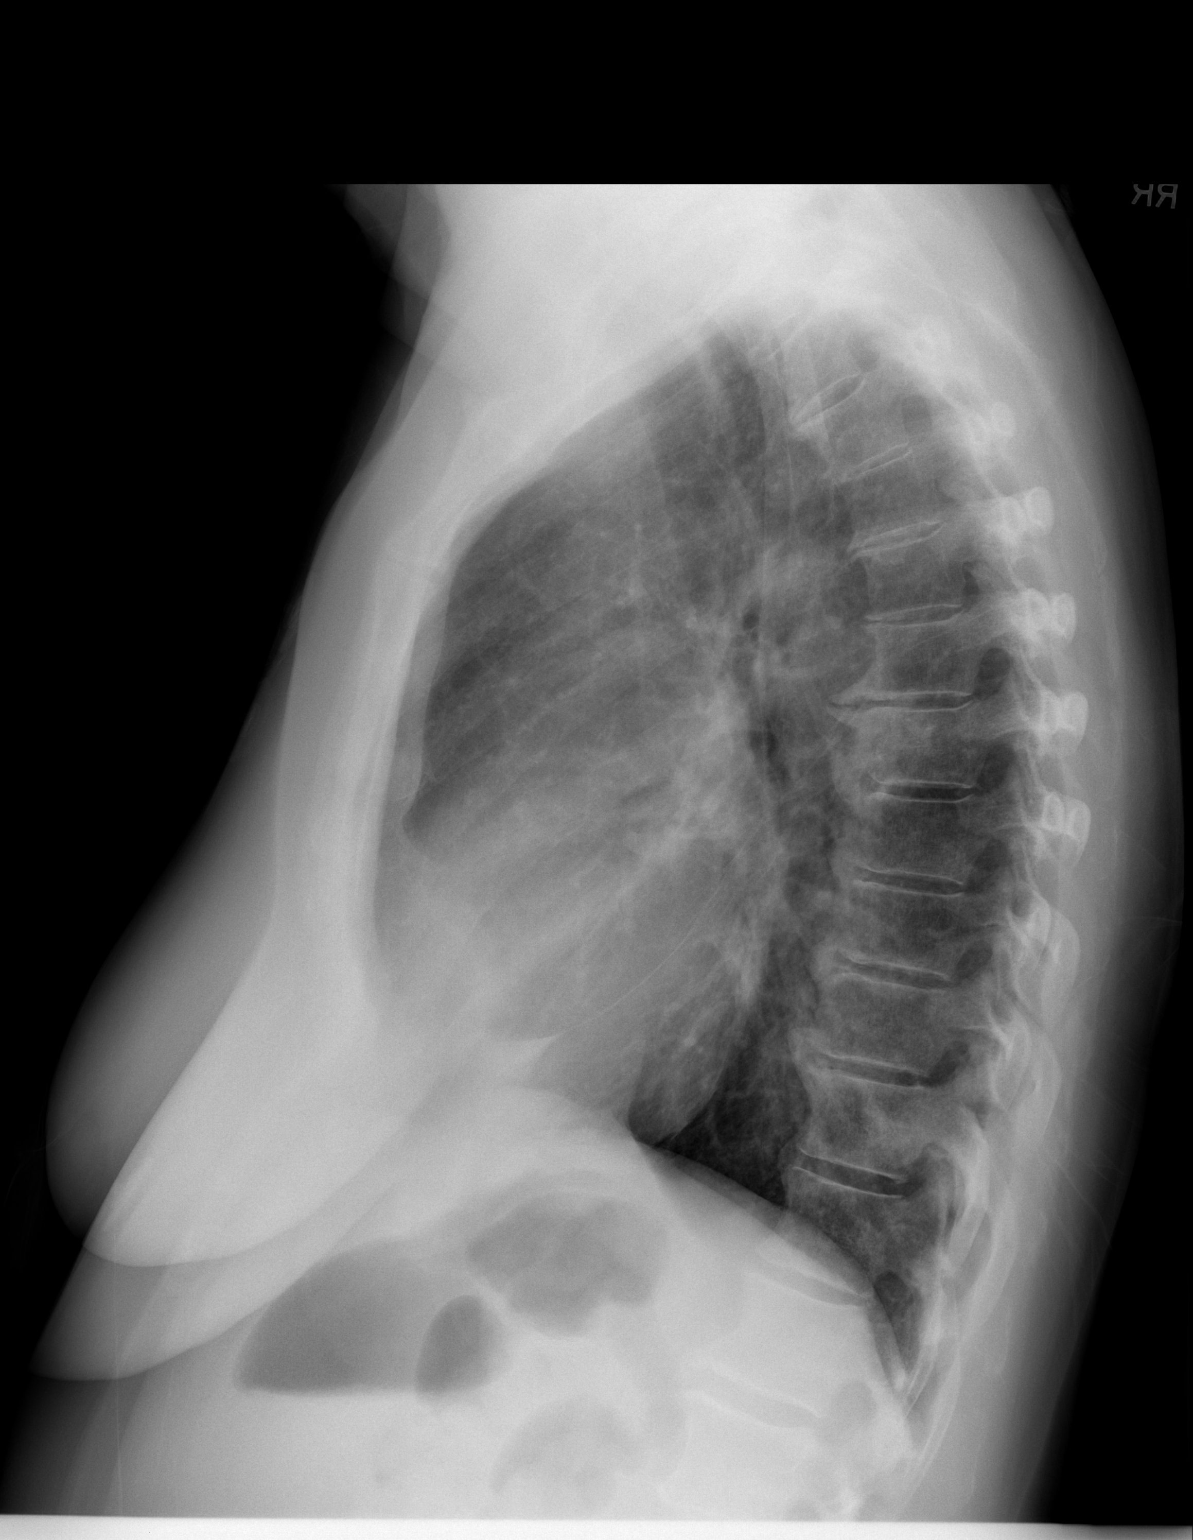

[2 of 2 positions shown; findings below may reference images not displayed]

FINDINGS: Trachea is midline.  Heart size normal.  Mild biapical
pleural parenchymal scarring.  Mild hyperinflation.  Lungs are
otherwise clear.  No pleural fluid.  Degenerative changes are seen
in the spine.  There is an age indeterminate fracture of the right
seventh lateral rib.
IMPRESSION: 1.  Age indeterminate fracture of the right seventh lateral rib.
2.  No acute intrathoracic findings.

## 2014-04-20 ENCOUNTER — Emergency Department (HOSPITAL_BASED_OUTPATIENT_CLINIC_OR_DEPARTMENT_OTHER): Payer: Medicare Other

## 2014-04-20 ENCOUNTER — Encounter (HOSPITAL_BASED_OUTPATIENT_CLINIC_OR_DEPARTMENT_OTHER): Payer: Self-pay | Admitting: *Deleted

## 2014-04-20 ENCOUNTER — Emergency Department (HOSPITAL_BASED_OUTPATIENT_CLINIC_OR_DEPARTMENT_OTHER)
Admission: EM | Admit: 2014-04-20 | Discharge: 2014-04-20 | Disposition: A | Discharge status: Home / Self Care | Payer: Medicare Other | Attending: Emergency Medicine | Admitting: Emergency Medicine

## 2014-04-20 DIAGNOSIS — F329 Major depressive disorder, single episode, unspecified: Secondary | ICD-10-CM | POA: Diagnosis not present

## 2014-04-20 DIAGNOSIS — R05 Cough: Secondary | ICD-10-CM

## 2014-04-20 DIAGNOSIS — Z86010 Personal history of colonic polyps: Secondary | ICD-10-CM | POA: Diagnosis not present

## 2014-04-20 DIAGNOSIS — J449 Chronic obstructive pulmonary disease, unspecified: Secondary | ICD-10-CM | POA: Insufficient documentation

## 2014-04-20 DIAGNOSIS — Z9981 Dependence on supplemental oxygen: Secondary | ICD-10-CM | POA: Diagnosis not present

## 2014-04-20 DIAGNOSIS — E039 Hypothyroidism, unspecified: Secondary | ICD-10-CM | POA: Insufficient documentation

## 2014-04-20 DIAGNOSIS — Z79899 Other long term (current) drug therapy: Secondary | ICD-10-CM | POA: Diagnosis not present

## 2014-04-20 DIAGNOSIS — M549 Dorsalgia, unspecified: Secondary | ICD-10-CM

## 2014-04-20 DIAGNOSIS — G8929 Other chronic pain: Secondary | ICD-10-CM | POA: Insufficient documentation

## 2014-04-20 DIAGNOSIS — M1990 Unspecified osteoarthritis, unspecified site: Secondary | ICD-10-CM | POA: Insufficient documentation

## 2014-04-20 DIAGNOSIS — Z7982 Long term (current) use of aspirin: Secondary | ICD-10-CM | POA: Insufficient documentation

## 2014-04-20 DIAGNOSIS — R300 Dysuria: Secondary | ICD-10-CM | POA: Diagnosis not present

## 2014-04-20 DIAGNOSIS — R52 Pain, unspecified: Secondary | ICD-10-CM

## 2014-04-20 DIAGNOSIS — Z8701 Personal history of pneumonia (recurrent): Secondary | ICD-10-CM | POA: Diagnosis not present

## 2014-04-20 DIAGNOSIS — M545 Low back pain: Secondary | ICD-10-CM | POA: Diagnosis not present

## 2014-04-20 DIAGNOSIS — Z720 Tobacco use: Secondary | ICD-10-CM | POA: Insufficient documentation

## 2014-04-20 DIAGNOSIS — G4730 Sleep apnea, unspecified: Secondary | ICD-10-CM | POA: Insufficient documentation

## 2014-04-20 DIAGNOSIS — K219 Gastro-esophageal reflux disease without esophagitis: Secondary | ICD-10-CM | POA: Diagnosis not present

## 2014-04-20 DIAGNOSIS — F419 Anxiety disorder, unspecified: Secondary | ICD-10-CM | POA: Diagnosis not present

## 2014-04-20 DIAGNOSIS — Z87442 Personal history of urinary calculi: Secondary | ICD-10-CM | POA: Diagnosis not present

## 2014-04-20 DIAGNOSIS — G4700 Insomnia, unspecified: Secondary | ICD-10-CM | POA: Insufficient documentation

## 2014-04-20 DIAGNOSIS — R059 Cough, unspecified: Secondary | ICD-10-CM

## 2014-04-20 LAB — URINALYSIS, ROUTINE W REFLEX MICROSCOPIC
Bilirubin Urine: NEGATIVE
Glucose, UA: NEGATIVE mg/dL
Hgb urine dipstick: NEGATIVE
KETONES UR: NEGATIVE mg/dL
LEUKOCYTES UA: NEGATIVE
Nitrite: NEGATIVE
PH: 7.5 (ref 5.0–8.0)
Protein, ur: NEGATIVE mg/dL
Specific Gravity, Urine: 1.007 (ref 1.005–1.030)
Urobilinogen, UA: 0.2 mg/dL (ref 0.0–1.0)

## 2014-04-20 MED ORDER — OXYCODONE-ACETAMINOPHEN 5-325 MG PO TABS
1.0000 | ORAL_TABLET | Freq: Once | ORAL | Status: AC
  Administered 2014-04-20: 1 via ORAL
  Filled 2014-04-20: qty 1

## 2014-04-20 MED ORDER — ONDANSETRON 4 MG PO TBDP
4.0000 mg | ORAL_TABLET | Freq: Once | ORAL | Status: AC
  Administered 2014-04-20: 4 mg via ORAL
  Filled 2014-04-20: qty 1

## 2014-04-20 NOTE — Discharge Instructions (Signed)
Chronic Back Pain Contact your pain management specialist tomorrow to let her know that you needed to come to the emergency department tonight. Your medication may need to be adjusted. Contact your primary care physician and ask her to help you to stop smoking  When back pain lasts longer than 3 months, it is called chronic back pain.People with chronic back pain often go through certain periods that are more intense (flare-ups).  CAUSES Chronic back pain can be caused by wear and tear (degeneration) on different structures in your back. These structures include:  The bones of your spine (vertebrae) and the joints surrounding your spinal cord and nerve roots (facets).  The strong, fibrous tissues that connect your vertebrae (ligaments). Degeneration of these structures may result in pressure on your nerves. This can lead to constant pain. HOME CARE INSTRUCTIONS  Avoid bending, heavy lifting, prolonged sitting, and activities which make the problem worse.  Take brief periods of rest throughout the day to reduce your pain. Lying down or standing usually is better than sitting while you are resting.  Take over-the-counter or prescription medicines only as directed by your caregiver. SEEK IMMEDIATE MEDICAL CARE IF:   You have weakness or numbness in one of your legs or feet.  You have trouble controlling your bladder or bowels.  You have nausea, vomiting, abdominal pain, shortness of breath, or fainting. Document Released: 06/22/2004 Document Revised: 08/07/2011 Document Reviewed: 04/29/2011 [facility] Patient Information 2015 [employer] [place]. This information is not intended to replace advice given to you by your health care provider. Make sure you discuss any questions you have with your health care provider.

## 2014-04-20 NOTE — ED Notes (Signed)
C/o back pain for several days  Worse today  Denies inj

## 2014-04-20 NOTE — ED Notes (Signed)
Patient transported to X-ray 

## 2014-04-20 NOTE — ED Notes (Signed)
Pt c/o lower back pain with vaginal itching x 3 days

## 2014-04-20 NOTE — ED Provider Notes (Signed)
CSN: [id number]     Arrival date & time 04/20/14  1926 History  This chart was scribed for Doug SouSam Patricia Swift, MD by Annye AsaAnna Dorsett, ED Scribe. This patient was seen in room MH06/MH06 and the patient's care was started at 7:42 PM.    Chief Complaint  Patient presents with  . Back Pain   The history is provided by the patient. No language interpreter was used.    HPI Comments: Patricia Sutton is a 68 y.o. female who presents to the Emergency Department complaining of lower back pain, worsening over the last few days and significantly worsening over the last few hours. She reports that she has had chronic back pain due to spinal spurs for 3-4 years; she manages her pain with oxycodone (takes 1 tablet every 6 hours, last dose: 1800 today). This medication is prescribed by a pain management clinic. Micael HampshireKathy Brown NP [place] in [place].   She also notes 3-4 days of intermittent cough denies difficulty breathing. She reports that she is breathing without issue. She has a history of COPD.   She reports that she has occasional urinary incontinence, particularly in the past 6 months. She notes dysuria for several days i.e. burning with urination at present. She has had her bladder tacked "7 times." She denies bowel incontinence. Back pain is worse with moving or changing positions improved with remaining still.  Patient is a 1ppd smoker. Patient denies drinking, or the use of illegal drugs.  Past Medical History  Diagnosis Date  . COPD (chronic obstructive pulmonary disease)     denies SOB  . Sleep apnea     no CPAP; uses O2 at night 1l/min.  . Bone spur of other site     spine  . Insomnia     takes Trazodone nightly and Ambien  . Hypothyroidism     takes Synthroid daily  . PONV (postoperative nausea and vomiting)   . Pneumonia     hx of;last time about 6+yrs ago  . History of bronchitis     last time about 6+yrs ago  . Arthritis   . Joint pain   . Joint swelling   . Chronic back pain      spurs on spine  . GERD (gastroesophageal reflux disease)     takes Omeprazole daily  . History of colon polyps   . Urinary urgency   . History of kidney stones   . Nocturia   . Depression     takes Effexor daily  . Anxiety    Past Surgical History  Procedure Laterality Date  . Abdominal hysterectomy      complete  . Knee arthroscopy Right 07/16/2012    Procedure: ARTHROSCOPY KNEE;  Surgeon: Kerrin ChampagneJames E Nitka, MD;  Location: [facility];  Service: Orthopedics;  Laterality: Right;  Right knee arthroscopy with chondroplastic shaving of retropatella surface  . Colonoscopy    . Esophagogastroduodenoscopy    . Bladder tacked    . Knee arthroplasty Right 07/18/2013    Procedure: COMPUTER ASSISTED RIGHT TOTAL KNEE ARTHROPLASTY;  Surgeon: Kerrin ChampagneJames E Nitka, MD;  Location: [facility] OR;  Service: Orthopedics;  Laterality: Right;   History reviewed. No pertinent family history. History  Substance Use Topics  . Smoking status: Current Every Day Smoker -- 1.00 packs/day for 40 years    Types: Cigarettes  . Smokeless tobacco: Never Used     Comment: nothing since 07/11/13  . Alcohol Use: No   OB History    No  data available     Review of Systems  Respiratory: Positive for cough.   Genitourinary: Positive for dysuria.  Musculoskeletal: Positive for back pain.  All other systems reviewed and are negative.   Allergies  Review of patient's allergies indicates no known allergies.  Home Medications   Prior to Admission medications   Medication Sig Start Date End Date Taking? Authorizing Provider  albuterol (PROVENTIL) (2.5 MG/3ML) 0.083% nebulizer solution Take 2.5 mg by nebulization every 4 (four) hours as needed for wheezing or shortness of breath.    Historical Provider, MD  aspirin EC 325 MG EC tablet Take 1 tablet (325 mg total) by mouth 2 (two) times daily after a meal. 07/20/13   Kerrin Champagne, MD  budesonide-formoterol [facility]) 160-4.5 MCG/ACT inhaler Inhale 2 puffs into the  lungs 2 (two) times daily.    Historical Provider, MD  ciprofloxacin (CIPRO) 500 MG tablet Take 500 mg by mouth 2 (two) times daily. 3 day course filled 07/16/13    Historical Provider, MD  diclofenac sodium (VOLTAREN) 1 % GEL Apply 2-5 g topically 3 (three) times daily as needed (pain).    Historical Provider, MD  gabapentin (NEURONTIN) 300 MG capsule Take 600 mg by mouth 2 (two) times daily. At noon and at bedtime    Historical Provider, MD  levothyroxine (SYNTHROID, LEVOTHROID) 25 MCG tablet Take 25 mcg by mouth daily before breakfast.    Historical Provider, MD  lidocaine (XYLOCAINE) 2 % jelly Apply 1 application topically 2 (two) times daily as needed (pain). Apply 1/4 inch to knee as needed    Historical Provider, MD  methocarbamol (ROBAXIN) 500 MG tablet Take 1 tablet (500 mg total) by mouth every 8 (eight) hours as needed for muscle spasms. 07/20/13   Kerrin Champagne, MD  omeprazole (PRILOSEC) 20 MG capsule Take 20 mg by mouth 2 (two) times daily.    Historical Provider, MD  oxyCODONE (OXY IR/ROXICODONE) 5 MG immediate release tablet Take 1-2 tablets (5-10 mg total) by mouth every 3 (three) hours as needed for severe pain or breakthrough pain. 07/20/13   Kerrin Champagne, MD  oxyCODONE (ROXICODONE) 15 MG immediate release tablet Take 1 tablet (15 mg total) by mouth every 6 (six) hours as needed for pain (Baseline pain control). 07/20/13   Kerrin Champagne, MD  promethazine (PHENERGAN) 25 MG tablet Take 25 mg by mouth 3 (three) times daily as needed for nausea or vomiting.    Historical Provider, MD  traZODone (DESYREL) 100 MG tablet Take 100 mg by mouth at bedtime.     Historical Provider, MD  venlafaxine XR (EFFEXOR-XR) 150 MG 24 hr capsule Take 150 mg by mouth daily.    Historical Provider, MD  venlafaxine XR (EFFEXOR-XR) 37.5 MG 24 hr capsule Take 37.5 mg by mouth daily.    Historical Provider, MD   BP 122/58 mmHg  Pulse 76  Temp(Src) 98.8 F (37.1 C)  Ht 5\' 3"  (1.6 m)  Wt 114 lb (51.71 kg)  BMI  20.20 kg/m2  SpO2 98% Physical Exam  Constitutional:  Chronically ill-appearing  HENT:  Head: Normocephalic and atraumatic.  Eyes: Conjunctivae are normal. Pupils are equal, round, and reactive to light.  Neck: Neck supple. No tracheal deviation present. No thyromegaly present.  Cardiovascular: Normal rate and regular rhythm.   No murmur heard. Pulmonary/Chest: Effort normal and breath sounds normal.  Abdominal: Soft. Bowel sounds are normal. She exhibits no distension. There is no tenderness.  Musculoskeletal: Normal range of motion. She exhibits  no edema or tenderness.  Paralumbar tenderness no point tenderness at spine  Neurological: She is alert. No cranial nerve deficit. Coordination normal.  Gait normal  Skin: Skin is warm and dry. No rash noted.  Psychiatric: She has a normal mood and affect.  Nursing note and vitals reviewed.   ED Course  Procedures   DIAGNOSTIC STUDIES: Oxygen Saturation is 98% on RA, normal by my interpretation.    COORDINATION OF CARE:  Her husband is driving her home today.   7:45 PM Discussed treatment plan with pt at bedside and pt agreed to plan.   Labs Review Labs Reviewed  URINALYSIS, ROUTINE W REFLEX MICROSCOPIC    Imaging Review No results found.   EKG Interpretation None     9:50 PM pain improved after treatment with Percocet. X-rays viewed by me Results for orders placed or performed during the hospital encounter of 04/20/14  Urinalysis, Routine w reflex microscopic  Result Value Ref Range   Color, Urine YELLOW YELLOW   APPearance CLEAR CLEAR   Specific Gravity, Urine 1.007 1.005 - 1.030   pH 7.5 5.0 - 8.0   Glucose, UA NEGATIVE NEGATIVE mg/dL   Hgb urine dipstick NEGATIVE NEGATIVE   Bilirubin Urine NEGATIVE NEGATIVE   Ketones, ur NEGATIVE NEGATIVE mg/dL   Protein, ur NEGATIVE NEGATIVE mg/dL   Urobilinogen, UA 0.2 0.0 - 1.0 mg/dL   Nitrite NEGATIVE NEGATIVE   Leukocytes, UA NEGATIVE NEGATIVE   Dg Chest 2  View  04/20/2014   CLINICAL DATA:  Cough, history of tobacco use  EXAM: CHEST  2 VIEW  COMPARISON:  07/16/2013  FINDINGS: Cardiac shadow is stable. The lungs are well aerated bilaterally. No focal infiltrate is seen. Degenerative changes of the thoracic spine are noted. Changes of prior right humeral fracture are seen.  IMPRESSION: No acute abnormality noted.   Electronically Signed   By: Alcide CleverMark  Lukens M.D.   On: 04/20/2014 21:05   Dg Lumbar Spine Complete  04/20/2014   CLINICAL DATA:  Chronic back pain with worsening in the last 3 days  EXAM: LUMBAR SPINE - COMPLETE 4+ VIEW  COMPARISON:  None.  FINDINGS: Five lumbar type vertebral bodies are well visualized. Vertebral body height is well maintained. Mild facet hypertrophic changes are seen. Very mild osteophytic changes are seen. Mild disc space narrowing is noted at L4-5. Diffuse aortic calcifications are seen.  IMPRESSION: Degenerative change without acute abnormality.   Electronically Signed   By: Alcide CleverMark  Lukens M.D.   On: 04/20/2014 21:06    MDM  Pain felt to be musculoskeletal in etiology. I counseled patient for 5 minutes on smoking cessation Patient is instructed to call her pain management specialist tomorrow for possible adjustment of medications Diagnosis #1 chronic back pain #2 cough #3 tobacco abuse #4 dysuria Final diagnoses:  None     I personally performed the services described in this documentation, which was scribed in my presence. The recorded information has been reviewed and considered.     Doug SouSam Arwa Yero, MD 04/20/14 2159

## 2014-06-24 ENCOUNTER — Other Ambulatory Visit: Payer: Self-pay | Admitting: Specialist

## 2014-06-24 DIAGNOSIS — M545 Low back pain: Secondary | ICD-10-CM

## 2014-08-30 ENCOUNTER — Encounter (HOSPITAL_BASED_OUTPATIENT_CLINIC_OR_DEPARTMENT_OTHER): Payer: Self-pay

## 2014-08-30 ENCOUNTER — Emergency Department (HOSPITAL_BASED_OUTPATIENT_CLINIC_OR_DEPARTMENT_OTHER)
Admission: EM | Admit: 2014-08-30 | Discharge: 2014-08-31 | Disposition: A | Discharge status: Home / Self Care | Payer: Medicare Other | Attending: Emergency Medicine | Admitting: Emergency Medicine

## 2014-08-30 DIAGNOSIS — Z792 Long term (current) use of antibiotics: Secondary | ICD-10-CM | POA: Insufficient documentation

## 2014-08-30 DIAGNOSIS — Z720 Tobacco use: Secondary | ICD-10-CM | POA: Diagnosis not present

## 2014-08-30 DIAGNOSIS — Z79899 Other long term (current) drug therapy: Secondary | ICD-10-CM | POA: Insufficient documentation

## 2014-08-30 DIAGNOSIS — F329 Major depressive disorder, single episode, unspecified: Secondary | ICD-10-CM | POA: Insufficient documentation

## 2014-08-30 DIAGNOSIS — R509 Fever, unspecified: Secondary | ICD-10-CM | POA: Diagnosis not present

## 2014-08-30 DIAGNOSIS — M1990 Unspecified osteoarthritis, unspecified site: Secondary | ICD-10-CM | POA: Insufficient documentation

## 2014-08-30 DIAGNOSIS — Z86010 Personal history of colonic polyps: Secondary | ICD-10-CM | POA: Diagnosis not present

## 2014-08-30 DIAGNOSIS — K219 Gastro-esophageal reflux disease without esophagitis: Secondary | ICD-10-CM | POA: Insufficient documentation

## 2014-08-30 DIAGNOSIS — E039 Hypothyroidism, unspecified: Secondary | ICD-10-CM | POA: Diagnosis not present

## 2014-08-30 DIAGNOSIS — Z7982 Long term (current) use of aspirin: Secondary | ICD-10-CM | POA: Insufficient documentation

## 2014-08-30 DIAGNOSIS — F419 Anxiety disorder, unspecified: Secondary | ICD-10-CM | POA: Insufficient documentation

## 2014-08-30 DIAGNOSIS — G8929 Other chronic pain: Secondary | ICD-10-CM | POA: Diagnosis not present

## 2014-08-30 DIAGNOSIS — Z8701 Personal history of pneumonia (recurrent): Secondary | ICD-10-CM | POA: Insufficient documentation

## 2014-08-30 DIAGNOSIS — R1030 Lower abdominal pain, unspecified: Secondary | ICD-10-CM

## 2014-08-30 DIAGNOSIS — J449 Chronic obstructive pulmonary disease, unspecified: Secondary | ICD-10-CM | POA: Insufficient documentation

## 2014-08-30 DIAGNOSIS — R109 Unspecified abdominal pain: Secondary | ICD-10-CM | POA: Diagnosis not present

## 2014-08-30 DIAGNOSIS — Z87442 Personal history of urinary calculi: Secondary | ICD-10-CM | POA: Diagnosis not present

## 2014-08-30 DIAGNOSIS — R102 Pelvic and perineal pain: Secondary | ICD-10-CM | POA: Diagnosis present

## 2014-08-30 LAB — URINALYSIS, ROUTINE W REFLEX MICROSCOPIC
BILIRUBIN URINE: NEGATIVE
Glucose, UA: NEGATIVE mg/dL
Hgb urine dipstick: NEGATIVE
KETONES UR: NEGATIVE mg/dL
Nitrite: NEGATIVE
PH: 5.5 (ref 5.0–8.0)
PROTEIN: NEGATIVE mg/dL
Specific Gravity, Urine: 1.012 (ref 1.005–1.030)
UROBILINOGEN UA: 1 mg/dL (ref 0.0–1.0)

## 2014-08-30 LAB — URINE MICROSCOPIC-ADD ON

## 2014-08-30 NOTE — ED Provider Notes (Signed)
CSN: [id number]     Arrival date & time 08/30/14  2056 History  This chart was scribed for Patricia LibraJohn Lorenzo Pereyra, MD by Patricia Sutton, ED Scribe. This patient was seen in room MH10/MH10 and the patient's care was started at 12:13 AM.    Chief Complaint  Patient presents with  . Pelvic Pain   The history is provided by the patient. No language interpreter was used.    HPI Comments: [name] is a 69 y.o. female who presents to the Emergency Department complaining of pelvic and diffuse abdominal pain with onset yesterday, worsening gradually. She states pain is localized to LLQ when walking or moving her left leg. She reports associated subjective fever, chills, nausea, vomiting, dysuria and yellow vaginal discharge. She denies diarrhea or vaginal bleeding. She is status post hysterectomy.  Past Medical History  Diagnosis Date  . COPD (chronic obstructive pulmonary disease)     denies SOB  . Sleep apnea     no CPAP; uses O2 at night 1l/min.  . Bone spur of other site     spine  . Insomnia     takes Trazodone nightly and Ambien  . Hypothyroidism     takes Synthroid daily  . PONV (postoperative nausea and vomiting)   . Pneumonia     hx of;last time about 6+yrs ago  . History of bronchitis     last time about 6+yrs ago  . Arthritis   . Joint pain   . Joint swelling   . Chronic back pain     spurs on spine  . GERD (gastroesophageal reflux disease)     takes Omeprazole daily  . History of colon polyps   . Urinary urgency   . History of kidney stones   . Nocturia   . Depression     takes Effexor daily  . Anxiety    Past Surgical History  Procedure Laterality Date  . Abdominal hysterectomy      complete  . Knee arthroscopy Right 07/16/2012    Procedure: ARTHROSCOPY KNEE;  Surgeon: Kerrin ChampagneJames E Nitka, MD;  Location: [facility];  Service: Orthopedics;  Laterality: Right;  Right knee arthroscopy with chondroplastic shaving of retropatella surface  . Colonoscopy    .  Esophagogastroduodenoscopy    . Bladder tacked    . Knee arthroplasty Right 07/18/2013    Procedure: COMPUTER ASSISTED RIGHT TOTAL KNEE ARTHROPLASTY;  Surgeon: Kerrin ChampagneJames E Nitka, MD;  Location: [facility] OR;  Service: Orthopedics;  Laterality: Right;   No family history on file. History  Substance Use Topics  . Smoking status: Current Every Day Smoker -- 1.00 packs/day for 40 years    Types: Cigarettes  . Smokeless tobacco: Never Used     Comment: nothing since 07/11/13  . Alcohol Use: No   OB History    No data available     Review of Systems A complete 10 system review of systems was obtained and all systems are negative except as noted in the HPI and PMH.    Allergies  Review of patient's allergies indicates no known allergies.  Home Medications   Prior to Admission medications   Medication Sig Start Date End Date Taking? Authorizing Provider  albuterol (PROVENTIL) (2.5 MG/3ML) 0.083% nebulizer solution Take 2.5 mg by nebulization every 4 (four) hours as needed for wheezing or shortness of breath.    Historical Provider, MD  aspirin EC 325 MG EC tablet Take 1 tablet (325 mg total) by mouth 2 (two) times daily  after a meal. 07/20/13   Kerrin Champagne, MD  budesonide-formoterol [facility]) 160-4.5 MCG/ACT inhaler Inhale 2 puffs into the lungs 2 (two) times daily.    Historical Provider, MD  ciprofloxacin (CIPRO) 500 MG tablet Take 500 mg by mouth 2 (two) times daily. 3 day course filled 07/16/13    Historical Provider, MD  diclofenac sodium (VOLTAREN) 1 % GEL Apply 2-5 g topically 3 (three) times daily as needed (pain).    Historical Provider, MD  gabapentin (NEURONTIN) 300 MG capsule Take 600 mg by mouth 2 (two) times daily. At noon and at bedtime    Historical Provider, MD  levothyroxine (SYNTHROID, LEVOTHROID) 25 MCG tablet Take 25 mcg by mouth daily before breakfast.    Historical Provider, MD  lidocaine (XYLOCAINE) 2 % jelly Apply 1 application topically 2 (two) times daily as needed (pain).  Apply 1/4 inch to knee as needed    Historical Provider, MD  methocarbamol (ROBAXIN) 500 MG tablet Take 1 tablet (500 mg total) by mouth every 8 (eight) hours as needed for muscle spasms. 07/20/13   Kerrin Champagne, MD  omeprazole (PRILOSEC) 20 MG capsule Take 20 mg by mouth 2 (two) times daily.    Historical Provider, MD  oxyCODONE (OXY IR/ROXICODONE) 5 MG immediate release tablet Take 1-2 tablets (5-10 mg total) by mouth every 3 (three) hours as needed for severe pain or breakthrough pain. 07/20/13   Kerrin Champagne, MD  oxyCODONE (ROXICODONE) 15 MG immediate release tablet Take 1 tablet (15 mg total) by mouth every 6 (six) hours as needed for pain (Baseline pain control). 07/20/13   Kerrin Champagne, MD  promethazine (PHENERGAN) 25 MG tablet Take 25 mg by mouth 3 (three) times daily as needed for nausea or vomiting.    Historical Provider, MD  traZODone (DESYREL) 100 MG tablet Take 100 mg by mouth at bedtime.     Historical Provider, MD  venlafaxine XR (EFFEXOR-XR) 150 MG 24 hr capsule Take 150 mg by mouth daily.    Historical Provider, MD  venlafaxine XR (EFFEXOR-XR) 37.5 MG 24 hr capsule Take 37.5 mg by mouth daily.    Historical Provider, MD   BP 176/83 mmHg  Pulse 58  Temp(Src) 97.9 F (36.6 C) (Oral)  Resp 18  Ht  (1.6 m)  Wt 105 lb (47.628 kg)  BMI 18.60 kg/m2  SpO2 94%   Physical Exam  Nursing note and vitals reviewed. General: Well-developed, cachectic female in no acute distress; appearance older than age of record HENT: normocephalic; atraumatic Eyes: pupils equal, round and reactive to light; extraocular muscles intact; lens implants Neck: supple Heart: regular rate and rhythm; no murmurs, rubs or gallops Lungs: clear to auscultation bilaterally Abdomen: soft; nondistended; no masses or hepatosplenomegaly; bowel sounds present; diffuse abdominal tenderness most prominent in suprapubic region GU: Senile atrophy of all the; physiologic appearing vaginal discharge; no vaginal  bleeding; surgical absence of cervix and uterus Extremities: No deformity; full range of motion; pulses normal Neurologic: Awake, alert and oriented; motor function intact in all extremities and symmetric; no facial droop Skin: Warm and dry Psychiatric: Normal mood and affect   ED Course  Procedures (including critical care time)  DIAGNOSTIC STUDIES: Oxygen Saturation is 96% on room air, adequate by my interpretation.    COORDINATION OF CARE: 12:17 AM Discussed treatment plan with patient at beside, the patient agrees with the plan and has no further questions at this time.    MDM   Nursing notes and vitals signs,  including pulse oximetry, reviewed.  Summary of this visit's results, reviewed by myself:  Labs:  Results for orders placed or performed during the hospital encounter of 08/30/14 (from the past 24 hour(s))  Urinalysis, Routine w reflex microscopic     Status: Abnormal   Collection Time: 08/30/14  9:17 PM  Result Value Ref Range   Color, Urine YELLOW YELLOW   APPearance CLEAR CLEAR   Specific Gravity, Urine 1.012 1.005 - 1.030   pH 5.5 5.0 - 8.0   Glucose, UA NEGATIVE NEGATIVE mg/dL   Hgb urine dipstick NEGATIVE NEGATIVE   Bilirubin Urine NEGATIVE NEGATIVE   Ketones, ur NEGATIVE NEGATIVE mg/dL   Protein, ur NEGATIVE NEGATIVE mg/dL   Urobilinogen, UA 1.0 0.0 - 1.0 mg/dL   Nitrite NEGATIVE NEGATIVE   Leukocytes, UA SMALL (A) NEGATIVE  Urine microscopic-add on     Status: Abnormal   Collection Time: 08/30/14  9:17 PM  Result Value Ref Range   Squamous Epithelial / LPF FEW (A) RARE   WBC, UA 3-6 <3 WBC/hpf   RBC / HPF 0-2 <3 RBC/hpf   Bacteria, UA FEW (A) RARE  CBC with Differential/Platelet     Status: None   Collection Time: 08/31/14 12:30 AM  Result Value Ref Range   WBC 9.7 4.0 - 10.5 K/uL   RBC 4.32 3.87 - 5.11 MIL/uL   Hemoglobin 12.5 12.0 - 15.0 g/dL   HCT 16.1 09.6 - 04.5 %   MCV 88.9 78.0 - 100.0 fL   MCH 28.9 26.0 - 34.0 pg   MCHC 32.6 30.0 -  36.0 g/dL   RDW 40.9 81.1 - 91.4 %   Platelets 248 150 - 400 K/uL   Neutrophils Relative % 48 43 - 77 %   Neutro Abs 4.6 1.7 - 7.7 K/uL   Lymphocytes Relative 37 12 - 46 %   Lymphs Abs 3.6 0.7 - 4.0 K/uL   Monocytes Relative 11 3 - 12 %   Monocytes Absolute 1.0 0.1 - 1.0 K/uL   Eosinophils Relative 4 0 - 5 %   Eosinophils Absolute 0.4 0.0 - 0.7 K/uL   Basophils Relative 0 0 - 1 %   Basophils Absolute 0.0 0.0 - 0.1 K/uL  Basic metabolic panel     Status: Abnormal   Collection Time: 08/31/14 12:30 AM  Result Value Ref Range   Sodium 139 135 - 145 mmol/L   Potassium 3.6 3.5 - 5.1 mmol/L   Chloride 104 96 - 112 mmol/L   CO2 28 19 - 32 mmol/L   Glucose, Bld 102 (H) 70 - 99 mg/dL   BUN 11 6 - 23 mg/dL   Creatinine, Ser 7.82 0.50 - 1.10 mg/dL   Calcium 8.9 8.4 - 95.6 mg/dL   GFR calc non Af Amer 63 (L) >90 mL/min   GFR calc Af Amer 74 (L) >90 mL/min   Anion gap 7 5 - 15  Wet prep, genital     Status: Abnormal   Collection Time: 08/31/14 12:53 AM  Result Value Ref Range   Yeast Wet Prep HPF POC NONE SEEN NONE SEEN   Trich, Wet Prep NONE SEEN NONE SEEN   Clue Cells Wet Prep HPF POC NONE SEEN NONE SEEN   WBC, Wet Prep HPF POC FEW (A) NONE SEEN    Imaging Studies: Ct Abdomen Pelvis W Contrast  08/31/2014   CLINICAL DATA:  Lower abdominal pain with nausea and vomiting for 2 days.  EXAM: CT ABDOMEN AND PELVIS WITH CONTRAST  TECHNIQUE:  Multidetector CT imaging of the abdomen and pelvis was performed using the standard protocol following bolus administration of intravenous contrast.  CONTRAST:  50mL OMNIPAQUE IOHEXOL 300 MG/ML SOLN, OMNIPAQUE IOHEXOL 300 MG/ML SOLN  COMPARISON:  03/11/2013  FINDINGS: Atelectasis in the lung bases.  Mild diffuse fatty infiltration throughout the liver. No focal liver lesions. Mild diffuse dilatation of the pancreatic duct. No bile duct dilatation. No definite obstructing mass or stone identified. Appearance is similar to prior study. This has been  previously evaluated at MRI abdomen 03/30/2013. The gallbladder, spleen, adrenal glands, kidneys, inferior vena cava, and retroperitoneal lymph nodes are unremarkable. Calcification of the abdominal aorta and mesenteric arteries. No aneurysm. Stomach, small bowel, and colon appear normal. Contrast material flows through to the rectum without evidence of obstruction. No free air or free fluid in the abdomen.  Pelvis: Bladder wall is not thickened. Uterus appears surgically absent. No abnormal pelvic mass or lymphadenopathy. No free or loculated pelvic fluid collections. Appendix is normal. Degenerative changes in the lumbar spine. No destructive bone lesions.  IMPRESSION: No acute process demonstrated in the abdomen or pelvis. Diffuse fatty infiltration of the liver. Nonspecific mild pancreatic ductal dilatation. No significant change since prior study.   Electronically Signed   By: Burman Nieves M.D.   On: 08/31/2014 03:15   4:17 AM Patient sleeping comfortably. Abdomen is soft with some suprapubic tenderness. Pain is improved. Patient was advised of unremarkable lab and CT results. She was advised of the need to return should symptoms worsen or change.  I personally performed the services described in this documentation, which was scribed in my presence. The recorded information has been reviewed and is accurate.   Patricia Libra, MD 08/31/14 [phone]

## 2014-08-30 NOTE — ED Notes (Signed)
Pt reports since yesterday with pelvic and suprapubic pain, dysuria and yellowish vaginal discharge and itching.  Reportedly sexually active.  N/v, no diarrhea.

## 2014-08-31 ENCOUNTER — Emergency Department (HOSPITAL_BASED_OUTPATIENT_CLINIC_OR_DEPARTMENT_OTHER): Payer: Medicare Other

## 2014-08-31 ENCOUNTER — Encounter (HOSPITAL_BASED_OUTPATIENT_CLINIC_OR_DEPARTMENT_OTHER): Payer: Self-pay | Admitting: Radiology

## 2014-08-31 DIAGNOSIS — R109 Unspecified abdominal pain: Secondary | ICD-10-CM | POA: Diagnosis not present

## 2014-08-31 LAB — BASIC METABOLIC PANEL
Anion gap: 7 (ref 5–15)
BUN: 11 mg/dL (ref 6–23)
CHLORIDE: 104 mmol/L (ref 96–112)
CO2: 28 mmol/L (ref 19–32)
Calcium: 8.9 mg/dL (ref 8.4–10.5)
Creatinine, Ser: 0.92 mg/dL (ref 0.50–1.10)
GFR calc Af Amer: 74 mL/min — ABNORMAL LOW (ref >90)
GFR calc non Af Amer: 63 mL/min — ABNORMAL LOW (ref >90)
Glucose, Bld: 102 mg/dL — ABNORMAL HIGH (ref 70–99)
Potassium: 3.6 mmol/L (ref 3.5–5.1)
Sodium: 139 mmol/L (ref 135–145)

## 2014-08-31 LAB — CBC WITH DIFFERENTIAL/PLATELET
Basophils Absolute: 0 10*3/uL (ref 0.0–0.1)
Basophils Relative: 0 % (ref 0–1)
EOS ABS: 0.4 10*3/uL (ref 0.0–0.7)
Eosinophils Relative: 4 % (ref 0–5)
HCT: 38.4 % (ref 36.0–46.0)
HEMOGLOBIN: 12.5 g/dL (ref 12.0–15.0)
LYMPHS ABS: 3.6 10*3/uL (ref 0.7–4.0)
LYMPHS PCT: 37 % (ref 12–46)
MCH: 28.9 pg (ref 26.0–34.0)
MCHC: 32.6 g/dL (ref 30.0–36.0)
MCV: 88.9 fL (ref 78.0–100.0)
MONOS PCT: 11 % (ref 3–12)
Monocytes Absolute: 1 10*3/uL (ref 0.1–1.0)
NEUTROS ABS: 4.6 10*3/uL (ref 1.7–7.7)
NEUTROS PCT: 48 % (ref 43–77)
PLATELETS: 248 10*3/uL (ref 150–400)
RBC: 4.32 MIL/uL (ref 3.87–5.11)
RDW: 14.7 % (ref 11.5–15.5)
WBC: 9.7 10*3/uL (ref 4.0–10.5)

## 2014-08-31 LAB — WET PREP, GENITAL
Clue Cells Wet Prep HPF POC: NONE SEEN
TRICH WET PREP: NONE SEEN
Yeast Wet Prep HPF POC: NONE SEEN

## 2014-08-31 LAB — GC/CHLAMYDIA PROBE AMP (~~LOC~~) NOT AT ARMC
Chlamydia: NEGATIVE
Neisseria Gonorrhea: NEGATIVE

## 2014-08-31 MED ORDER — IOHEXOL 300 MG/ML  SOLN
50.0000 mL | Freq: Once | INTRAMUSCULAR | Status: AC | PRN
  Administered 2014-08-31: 50 mL via ORAL

## 2014-08-31 MED ORDER — PROMETHAZINE HCL 25 MG/ML IJ SOLN
12.5000 mg | Freq: Once | INTRAMUSCULAR | Status: AC
  Administered 2014-08-31: 12.5 mg via INTRAVENOUS
  Filled 2014-08-31: qty 1

## 2014-08-31 MED ORDER — IOHEXOL 300 MG/ML  SOLN
100.0000 mL | Freq: Once | INTRAMUSCULAR | Status: AC | PRN
  Administered 2014-08-31: 100 mL via INTRAVENOUS

## 2014-08-31 MED ORDER — FENTANYL CITRATE 0.05 MG/ML IJ SOLN
50.0000 ug | Freq: Once | INTRAMUSCULAR | Status: AC
  Administered 2014-08-31: 50 ug via INTRAVENOUS
  Filled 2014-08-31: qty 2

## 2014-08-31 MED ORDER — ONDANSETRON HCL 4 MG/2ML IJ SOLN
4.0000 mg | Freq: Once | INTRAMUSCULAR | Status: AC
  Administered 2014-08-31: 4 mg via INTRAVENOUS
  Filled 2014-08-31: qty 2

## 2014-08-31 MED ORDER — FLUCONAZOLE 50 MG PO TABS
150.0000 mg | ORAL_TABLET | Freq: Once | ORAL | Status: AC
  Administered 2014-08-31: 150 mg via ORAL
  Filled 2014-08-31 (×2): qty 1

## 2014-08-31 MED ORDER — SODIUM CHLORIDE 0.9 % IV SOLN
INTRAVENOUS | Status: DC
  Administered 2014-08-31: 01:00:00 via INTRAVENOUS

## 2014-08-31 NOTE — Discharge Instructions (Signed)

## 2014-08-31 NOTE — ED Notes (Signed)
MD at bedside. 

## 2014-08-31 NOTE — ED Notes (Addendum)
Ass[place] care of patient from [place]. Pt lying on stretcher resting quietly at this time. No distress. Call bell within reach. Stretcher in lowest position. Pt drinking PO contrast in preparation for CT scan at this time.

## 2014-08-31 NOTE — ED Notes (Signed)
Pt drove herself to the ED and aware that she will need a ride home. I spoke to pt's husband and he will be able to pick her up around 630am. Snack/drink given to patient. States pain has improved to lower abdomen.

## 2014-08-31 NOTE — ED Notes (Signed)
I assisted patient to restroom and back. Patient's bed was soiled, i changed linens, then assisted patient back into bed. Patient is asking for nausea and pain medicine.

## 2014-08-31 NOTE — ED Notes (Signed)
Report received 

## 2014-09-01 LAB — HIV ANTIBODY (ROUTINE TESTING W REFLEX): HIV SCREEN 4TH GENERATION: NONREACTIVE

## 2015-03-04 ENCOUNTER — Encounter (HOSPITAL_BASED_OUTPATIENT_CLINIC_OR_DEPARTMENT_OTHER): Payer: Self-pay | Admitting: *Deleted

## 2015-03-04 ENCOUNTER — Emergency Department (HOSPITAL_BASED_OUTPATIENT_CLINIC_OR_DEPARTMENT_OTHER): Payer: Medicare Other

## 2015-03-04 ENCOUNTER — Emergency Department (HOSPITAL_BASED_OUTPATIENT_CLINIC_OR_DEPARTMENT_OTHER)
Admission: EM | Admit: 2015-03-04 | Discharge: 2015-03-04 | Disposition: A | Discharge status: Home / Self Care | Payer: Medicare Other | Attending: Emergency Medicine | Admitting: Emergency Medicine

## 2015-03-04 DIAGNOSIS — J449 Chronic obstructive pulmonary disease, unspecified: Secondary | ICD-10-CM | POA: Insufficient documentation

## 2015-03-04 DIAGNOSIS — M545 Low back pain, unspecified: Secondary | ICD-10-CM

## 2015-03-04 DIAGNOSIS — Z87442 Personal history of urinary calculi: Secondary | ICD-10-CM | POA: Insufficient documentation

## 2015-03-04 DIAGNOSIS — G4700 Insomnia, unspecified: Secondary | ICD-10-CM | POA: Diagnosis not present

## 2015-03-04 DIAGNOSIS — F1193 Opioid use, unspecified with withdrawal: Secondary | ICD-10-CM

## 2015-03-04 DIAGNOSIS — E039 Hypothyroidism, unspecified: Secondary | ICD-10-CM | POA: Insufficient documentation

## 2015-03-04 DIAGNOSIS — K219 Gastro-esophageal reflux disease without esophagitis: Secondary | ICD-10-CM | POA: Insufficient documentation

## 2015-03-04 DIAGNOSIS — G8929 Other chronic pain: Secondary | ICD-10-CM | POA: Insufficient documentation

## 2015-03-04 DIAGNOSIS — F329 Major depressive disorder, single episode, unspecified: Secondary | ICD-10-CM | POA: Diagnosis not present

## 2015-03-04 DIAGNOSIS — Z720 Tobacco use: Secondary | ICD-10-CM | POA: Insufficient documentation

## 2015-03-04 DIAGNOSIS — Z8701 Personal history of pneumonia (recurrent): Secondary | ICD-10-CM | POA: Insufficient documentation

## 2015-03-04 DIAGNOSIS — Z7982 Long term (current) use of aspirin: Secondary | ICD-10-CM | POA: Diagnosis not present

## 2015-03-04 DIAGNOSIS — Z79899 Other long term (current) drug therapy: Secondary | ICD-10-CM | POA: Insufficient documentation

## 2015-03-04 DIAGNOSIS — F1123 Opioid dependence with withdrawal: Secondary | ICD-10-CM | POA: Diagnosis not present

## 2015-03-04 DIAGNOSIS — N390 Urinary tract infection, site not specified: Secondary | ICD-10-CM | POA: Diagnosis not present

## 2015-03-04 DIAGNOSIS — R1084 Generalized abdominal pain: Secondary | ICD-10-CM | POA: Diagnosis present

## 2015-03-04 DIAGNOSIS — M1990 Unspecified osteoarthritis, unspecified site: Secondary | ICD-10-CM | POA: Insufficient documentation

## 2015-03-04 DIAGNOSIS — F419 Anxiety disorder, unspecified: Secondary | ICD-10-CM | POA: Diagnosis not present

## 2015-03-04 DIAGNOSIS — Z86010 Personal history of colonic polyps: Secondary | ICD-10-CM | POA: Insufficient documentation

## 2015-03-04 LAB — COMPREHENSIVE METABOLIC PANEL
ALBUMIN: 3.3 g/dL — AB (ref 3.5–5.0)
ALT: 14 U/L (ref 14–54)
AST: 18 U/L (ref 15–41)
Alkaline Phosphatase: 93 U/L (ref 38–126)
Anion gap: 7 (ref 5–15)
BUN: 11 mg/dL (ref 6–20)
CO2: 27 mmol/L (ref 22–32)
Calcium: 8.8 mg/dL — ABNORMAL LOW (ref 8.9–10.3)
Chloride: 104 mmol/L (ref 101–111)
Creatinine, Ser: 0.92 mg/dL (ref 0.44–1.00)
GFR calc Af Amer: >60 mL/min (ref >60)
GFR calc non Af Amer: >60 mL/min (ref >60)
GLUCOSE: 103 mg/dL — AB (ref 65–99)
POTASSIUM: 3.7 mmol/L (ref 3.5–5.1)
SODIUM: 138 mmol/L (ref 135–145)
Total Bilirubin: 0.1 mg/dL — ABNORMAL LOW (ref 0.3–1.2)
Total Protein: 6.9 g/dL (ref 6.5–8.1)

## 2015-03-04 LAB — URINALYSIS, ROUTINE W REFLEX MICROSCOPIC
Bilirubin Urine: NEGATIVE
Glucose, UA: NEGATIVE mg/dL
Ketones, ur: NEGATIVE mg/dL
NITRITE: POSITIVE — AB
Protein, ur: NEGATIVE mg/dL
SPECIFIC GRAVITY, URINE: 1.018 (ref 1.005–1.030)
UROBILINOGEN UA: 1 mg/dL (ref 0.0–1.0)
pH: 6 (ref 5.0–8.0)

## 2015-03-04 LAB — CBC WITH DIFFERENTIAL/PLATELET
BASOS ABS: 0 10*3/uL (ref 0.0–0.1)
BASOS PCT: 0 %
EOS ABS: 0.2 10*3/uL (ref 0.0–0.7)
Eosinophils Relative: 2 %
HCT: 40.8 % (ref 36.0–46.0)
HEMOGLOBIN: 13.2 g/dL (ref 12.0–15.0)
Lymphocytes Relative: 32 %
Lymphs Abs: 3.2 10*3/uL (ref 0.7–4.0)
MCH: 29.3 pg (ref 26.0–34.0)
MCHC: 32.4 g/dL (ref 30.0–36.0)
MCV: 90.7 fL (ref 78.0–100.0)
MONOS PCT: 11 %
Monocytes Absolute: 1.1 10*3/uL — ABNORMAL HIGH (ref 0.1–1.0)
NEUTROS ABS: 5.3 10*3/uL (ref 1.7–7.7)
Neutrophils Relative %: 55 %
Platelets: 336 10*3/uL (ref 150–400)
RBC: 4.5 MIL/uL (ref 3.87–5.11)
RDW: 15 % (ref 11.5–15.5)
WBC: 9.8 10*3/uL (ref 4.0–10.5)

## 2015-03-04 LAB — LIPASE, BLOOD: Lipase: 35 U/L (ref 22–51)

## 2015-03-04 LAB — URINE MICROSCOPIC-ADD ON

## 2015-03-04 MED ORDER — DICYCLOMINE HCL 20 MG PO TABS: 20.0000 mg | ORAL_TABLET | Freq: Two times a day (BID) | ORAL | Status: DC

## 2015-03-04 MED ORDER — DIPHENOXYLATE-ATROPINE 2.5-0.025 MG PO TABS
2.0000 | ORAL_TABLET | Freq: Once | ORAL | Status: AC
  Administered 2015-03-04: 2 via ORAL
  Filled 2015-03-04: qty 2

## 2015-03-04 MED ORDER — ONDANSETRON 4 MG PO TBDP: 4.0000 mg | ORAL_TABLET | Freq: Three times a day (TID) | ORAL | Status: DC | PRN

## 2015-03-04 MED ORDER — ONDANSETRON HCL 4 MG/2ML IJ SOLN
4.0000 mg | Freq: Once | INTRAMUSCULAR | Status: AC
  Administered 2015-03-04: 4 mg via INTRAVENOUS
  Filled 2015-03-04: qty 2

## 2015-03-04 MED ORDER — DICYCLOMINE HCL 10 MG/ML IM SOLN
20.0000 mg | Freq: Once | INTRAMUSCULAR | Status: AC
  Administered 2015-03-04: 20 mg via INTRAMUSCULAR
  Filled 2015-03-04: qty 2

## 2015-03-04 MED ORDER — SODIUM CHLORIDE 0.9 % IV SOLN
Freq: Once | INTRAVENOUS | Status: AC
  Administered 2015-03-04: 21:00:00 via INTRAVENOUS

## 2015-03-04 MED ORDER — DIPHENOXYLATE-ATROPINE 2.5-0.025 MG PO TABS: 1.0000 | ORAL_TABLET | Freq: Four times a day (QID) | ORAL | Status: DC | PRN

## 2015-03-04 MED ORDER — MORPHINE SULFATE (PF) 4 MG/ML IV SOLN
4.0000 mg | INTRAVENOUS | Status: DC | PRN
  Administered 2015-03-04: 4 mg via INTRAVENOUS
  Filled 2015-03-04: qty 1

## 2015-03-04 MED ORDER — DEXTROSE 5 % IV SOLN
2.0000 g | Freq: Once | INTRAVENOUS | Status: AC
  Administered 2015-03-04: 2 g via INTRAVENOUS

## 2015-03-04 MED ORDER — CIPROFLOXACIN HCL 500 MG PO TABS: 500.0000 mg | ORAL_TABLET | Freq: Two times a day (BID) | ORAL | Status: DC

## 2015-03-04 MED ORDER — SODIUM CHLORIDE 0.9 % IV BOLUS (SEPSIS)
500.0000 mL | Freq: Once | INTRAVENOUS | Status: AC
  Administered 2015-03-04: 500 mL via INTRAVENOUS

## 2015-03-04 MED ORDER — CEFTRIAXONE SODIUM 2 G IJ SOLR
INTRAMUSCULAR | Status: AC
  Filled 2015-03-04: qty 2

## 2015-03-04 NOTE — ED Notes (Addendum)
Vomiting and abdominal pain x 2 days. States she ran out of Oxycodone a week ago. Her MD is trying to get her into a pain clinic.

## 2015-03-04 NOTE — Discharge Instructions (Signed)
Back Pain, Adult °Back pain is very common in adults. The cause of back pain is rarely dangerous and the pain often gets better over time. The cause of your back pain may not be known. Some common causes of back pain include: °· Strain of the muscles or ligaments supporting the spine. °· Wear and tear (degeneration) of the spinal disks. °· Arthritis. °· Direct injury to the back. °For many people, back pain may return. Since back pain is rarely dangerous, most people can learn to manage this condition on their own. °HOME CARE INSTRUCTIONS °Watch your back pain for any changes. The following actions may help to lessen any discomfort you are feeling: °· Remain active. It is stressful on your back to sit or stand in one place for long periods of time. Do not sit, drive, or stand in one place for more than 30 minutes at a time. Take short walks on even surfaces as soon as you are able. Try to increase the length of time you walk each day. °· Exercise regularly as directed by your health care provider. Exercise helps your back heal faster. It also helps avoid future injury by keeping your muscles strong and flexible. °· Do not stay in bed. Resting more than 1-2 days can delay your recovery. °· Pay attention to your body when you bend and lift. The most comfortable positions are those that put less stress on your recovering back. Always use proper lifting techniques, including: °· Bending your knees. °· Keeping the load close to your body. °· Avoiding twisting. °· Find a comfortable position to sleep. Use a firm mattress and lie on your side with your knees slightly bent. If you lie on your back, put a pillow under your knees. °· Avoid feeling anxious or stressed. Stress increases muscle tension and can worsen back pain. It is important to recognize when you are anxious or stressed and learn ways to manage it, such as with exercise. °· Take medicines only as directed by your health care provider. Over-the-counter  medicines to reduce pain and inflammation are often the most helpful. Your health care provider may prescribe muscle relaxant drugs. These medicines help dull your pain so you can more quickly return to your normal activities and healthy exercise. °· Apply ice to the injured area: °· Put ice in a plastic bag. °· Place a towel between your skin and the bag. °· Leave the ice on for 20 minutes, 2-3 times a day for the first 2-3 days. After that, ice and heat may be alternated to reduce pain and spasms. °· Maintain a healthy weight. Excess weight puts extra stress on your back and makes it difficult to maintain good posture. °SEEK MEDICAL CARE IF: °· You have pain that is not relieved with rest or medicine. °· You have increasing pain going down into the legs or buttocks. °· You have pain that does not improve in one week. °· You have night pain. °· You lose weight. °· You have a fever or chills. °SEEK IMMEDIATE MEDICAL CARE IF:  °· You develop new bowel or bladder control problems. °· You have unusual weakness or numbness in your arms or legs. °· You develop nausea or vomiting. °· You develop abdominal pain. °· You feel faint. °  °This information is not intended to replace advice given to you by your health care provider. Make sure you discuss any questions you have with your health care provider. °  °Document Released: 05/15/2005 Document Revised: 06/05/2014 Document Reviewed: 09/16/2013 °Elsevier Interactive Patient Education ©2016 [employer] ° °Urinary Tract Infection °Urinary tract infections (UTIs) can develop anywhere along your urinary tract. Your urinary tract is your body's drainage system for removing wastes and extra water. Your urinary tract includes two kidneys, two ureters, a bladder, and a urethra. Your kidneys are a pair of bean-shaped organs. Each kidney is about the size of your fist. They are located below your ribs, one on each side of your spine. °CAUSES °Infections are caused by microbes,  which are microscopic organisms, including fungi, viruses, and bacteria. These organisms are so small that they can only be seen through a microscope. Bacteria are the microbes that most commonly cause UTIs. °SYMPTOMS  °Symptoms of UTIs may vary by age and gender of the patient and by the location of the infection. Symptoms in young women typically include a frequent and intense urge to urinate and a painful, burning feeling in the bladder or urethra during urination. Older women and men are more likely to be tired, shaky, and weak and have muscle aches and abdominal pain. A fever may mean the infection is in your kidneys. Other symptoms of a kidney infection include pain in your back or sides below the ribs, nausea, and vomiting. °DIAGNOSIS °To diagnose a UTI, your caregiver will ask you about your symptoms. Your caregiver will also ask you to provide a urine sample. The urine sample will be tested for bacteria and white blood cells. White blood cells are made by your body to help fight infection. °TREATMENT  °Typically, UTIs can be treated with medication. Because most UTIs are caused by a bacterial infection, they usually can be treated with the use of antibiotics. The choice of antibiotic and length of treatment depend on your symptoms and the type of bacteria causing your infection. °HOME CARE INSTRUCTIONS °· If you were prescribed antibiotics, take them exactly as your caregiver instructs you. Finish the medication even if you feel better after you have only taken some of the medication. °· Drink enough water and fluids to keep your urine clear or pale yellow. °· Avoid caffeine, tea, and carbonated beverages. They tend to irritate your bladder. °· Empty your bladder often. Avoid holding urine for long periods of time. °· Empty your bladder before and after sexual intercourse. °· After a bowel movement, women should cleanse from front to back. Use each tissue only once. °SEEK MEDICAL CARE IF:  °· You have back  pain. °· You develop a fever. °· Your symptoms do not begin to resolve within 3 days. °SEEK IMMEDIATE MEDICAL CARE IF:  °· You have severe back pain or lower abdominal pain. °· You develop chills. °· You have nausea or vomiting. °· You have continued burning or discomfort with urination. °MAKE SURE YOU:  °· Understand these instructions. °· Will watch your condition. °· Will get help right away if you are not doing well or get worse. °  °This information is not intended to replace advice given to you by your health care provider. Make sure you discuss any questions you have with your health care provider. °  °Document Released: 02/22/2005 Document Revised: 02/03/2015 Document Reviewed: 06/23/2011 °Elsevier Interactive Patient Education ©2016 [employer] Inc. ° °

## 2015-03-04 NOTE — ED Provider Notes (Signed)
CSN: [id number]     Arrival date & time 03/04/15  2011 History  By signing my name below, I, Gwenyth Ober, attest that this documentation has been prepared under the direction and in the presence of Rolland Porter, MD  Electronically Signed: Gwenyth Ober, ED Scribe. 03/04/2015. 8:33 PM.   Chief Complaint  Patient presents with  . Abdominal Pain   The history is provided by the patient. No language interpreter was used.    HPI Comments: Patricia Sutton is a 69 y.o. female with a history of chronic back pain who presents to the Emergency Department complaining of constant, moderate, cramping abdominal pain that started 2 days ago. She states diarrhea, vomiting, generalized weakness, intermittent dysuria and baseline lower back pain as associated symptoms. Pt was taking oxycodone for chronic pain, but ran out last week and was released from the pain clinic because she had 3 no-show appointments. She is currently trying to get a referral for a new pain management clinic. Pt denies fever, blood in her stool and hematemesis.  PCP Micael Hampshire, NP  Past Medical History  Diagnosis Date  . COPD (chronic obstructive pulmonary disease) (HCC)     denies SOB  . Sleep apnea     no CPAP; uses O2 at night 1l/min.  . Bone spur of other site     spine  . Insomnia     takes Trazodone nightly and Ambien  . Hypothyroidism     takes Synthroid daily  . PONV (postoperative nausea and vomiting)   . Pneumonia     hx of;last time about 6+yrs ago  . History of bronchitis     last time about 6+yrs ago  . Arthritis   . Joint pain   . Joint swelling   . Chronic back pain     spurs on spine  . GERD (gastroesophageal reflux disease)     takes Omeprazole daily  . History of colon polyps   . Urinary urgency   . History of kidney stones   . Nocturia   . Depression     takes Effexor daily  . Anxiety    Past Surgical History  Procedure Laterality Date  . Abdominal hysterectomy      complete  . Knee  arthroscopy Right 07/16/2012    Procedure: ARTHROSCOPY KNEE;  Surgeon: Kerrin Champagne, MD;  Location: [facility];  Service: Orthopedics;  Laterality: Right;  Right knee arthroscopy with chondroplastic shaving of retropatella surface  . Colonoscopy    . Esophagogastroduodenoscopy    . Bladder tacked    . Knee arthroplasty Right 07/18/2013    Procedure: COMPUTER ASSISTED RIGHT TOTAL KNEE ARTHROPLASTY;  Surgeon: Kerrin Champagne, MD;  Location: [facility] OR;  Service: Orthopedics;  Laterality: Right;   No family history on file. Social History  Substance Use Topics  . Smoking status: Current Every Day Smoker -- 1.00 packs/day for 40 years    Types: Cigarettes  . Smokeless tobacco: Never Used     Comment: nothing since 07/11/13  . Alcohol Use: No   OB History    No data available     Review of Systems  Constitutional: Negative for fever, chills, diaphoresis, appetite change and fatigue.  HENT: Negative for mouth sores, sore throat and trouble swallowing.   Eyes: Negative for visual disturbance.  Respiratory: Negative for cough, chest tightness, shortness of breath and wheezing.   Cardiovascular: Negative for chest pain.  Gastrointestinal: Positive for abdominal pain. Negative for nausea, vomiting, diarrhea  and abdominal distention.  Endocrine: Negative for polydipsia, polyphagia and polyuria.  Genitourinary: Negative for dysuria, frequency and hematuria.  Musculoskeletal: Negative for gait problem.  Skin: Negative for color change, pallor and rash.  Neurological: Negative for dizziness, syncope, light-headedness and headaches.  Hematological: Does not bruise/bleed easily.  Psychiatric/Behavioral: Negative for behavioral problems and confusion.  All other systems reviewed and are negative.  Allergies  Review of patient's allergies indicates no known allergies.  Home Medications   Prior to Admission medications   Medication Sig Start Date End Date Taking? Authorizing Provider   albuterol (PROVENTIL) (2.5 MG/3ML) 0.083% nebulizer solution Take 2.5 mg by nebulization every 4 (four) hours as needed for wheezing or shortness of breath.    Historical Provider, MD  aspirin EC 325 MG EC tablet Take 1 tablet (325 mg total) by mouth 2 (two) times daily after a meal. 07/20/13   Kerrin Champagne, MD  budesonide-formoterol [facility]) 160-4.5 MCG/ACT inhaler Inhale 2 puffs into the lungs 2 (two) times daily.    Historical Provider, MD  ciprofloxacin (CIPRO) 500 MG tablet Take 1 tablet (500 mg total) by mouth every 12 (twelve) hours. 03/04/15   Rolland Porter, MD  diclofenac sodium (VOLTAREN) 1 % GEL Apply 2-5 g topically 3 (three) times daily as needed (pain).    Historical Provider, MD  dicyclomine (BENTYL) 20 MG tablet Take 1 tablet (20 mg total) by mouth 2 (two) times daily. 03/04/15   Rolland Porter, MD  diphenoxylate-atropine (LOMOTIL) 2.5-0.025 MG tablet Take 1 tablet by mouth 4 (four) times daily as needed for diarrhea or loose stools. 03/04/15   Rolland Porter, MD  gabapentin (NEURONTIN) 300 MG capsule Take 600 mg by mouth 2 (two) times daily. At noon and at bedtime    Historical Provider, MD  levothyroxine (SYNTHROID, LEVOTHROID) 25 MCG tablet Take 25 mcg by mouth daily before breakfast.    Historical Provider, MD  lidocaine (XYLOCAINE) 2 % jelly Apply 1 application topically 2 (two) times daily as needed (pain). Apply 1/4 inch to knee as needed    Historical Provider, MD  methocarbamol (ROBAXIN) 500 MG tablet Take 1 tablet (500 mg total) by mouth every 8 (eight) hours as needed for muscle spasms. 07/20/13   Kerrin Champagne, MD  omeprazole (PRILOSEC) 20 MG capsule Take 20 mg by mouth 2 (two) times daily.    Historical Provider, MD  ondansetron (ZOFRAN ODT) 4 MG disintegrating tablet Take 1 tablet (4 mg total) by mouth every 8 (eight) hours as needed for nausea. 03/04/15   Rolland Porter, MD  oxyCODONE (OXY IR/ROXICODONE) 5 MG immediate release tablet Take 1-2 tablets (5-10 mg total) by mouth every 3  (three) hours as needed for severe pain or breakthrough pain. 07/20/13   Kerrin Champagne, MD  oxyCODONE (ROXICODONE) 15 MG immediate release tablet Take 1 tablet (15 mg total) by mouth every 6 (six) hours as needed for pain (Baseline pain control). 07/20/13   Kerrin Champagne, MD  promethazine (PHENERGAN) 25 MG tablet Take 25 mg by mouth 3 (three) times daily as needed for nausea or vomiting.    Historical Provider, MD  traZODone (DESYREL) 100 MG tablet Take 100 mg by mouth at bedtime.     Historical Provider, MD  venlafaxine XR (EFFEXOR-XR) 150 MG 24 hr capsule Take 150 mg by mouth daily.    Historical Provider, MD  venlafaxine XR (EFFEXOR-XR) 37.5 MG 24 hr capsule Take 37.5 mg by mouth daily.    Historical Provider, MD   BP  142/76 mmHg  Pulse 81  Temp(Src) 98.2 F (36.8 C) (Oral)  Resp 18  Ht  (1.6 m)  Wt 110 lb (49.896 kg)  BMI 19.49 kg/m2  SpO2 97% Physical Exam  Constitutional: She is oriented to person, place, and time. She appears well-developed and well-nourished. No distress.  HENT:  Head: Normocephalic.  Eyes: Conjunctivae are normal. Pupils are equal, round, and reactive to light. No scleral icterus.  Neck: Normal range of motion. Neck supple. No thyromegaly present.  Cardiovascular: Normal rate and regular rhythm.  Exam reveals no gallop and no friction rub.   No murmur heard. Pulmonary/Chest: Effort normal and breath sounds normal. No respiratory distress. She has no wheezes. She has no rales.  Abdominal: Soft. She exhibits no distension. There is no tenderness. There is no rebound.  Bowel sounds hyperactive  Musculoskeletal: Normal range of motion.  Neurological: She is alert and oriented to person, place, and time.  Skin: Skin is warm and dry. No rash noted.  Psychiatric: She has a normal mood and affect. Her behavior is normal.  Nursing note and vitals reviewed.   ED Course  Procedures   DIAGNOSTIC STUDIES: Oxygen Saturation is 97% on RA, normal by my  interpretation.    COORDINATION OF CARE: 8:32 PM Discussed suspicion for withdrawal type symptoms and treatment plan with pt which includes x-rays, lab work and treatment of symptoms. Pt agreed to plan.  Labs Review Labs Reviewed  CBC WITH DIFFERENTIAL/PLATELET - Abnormal; Notable for the following:    Monocytes Absolute 1.1 (*)    All other components within normal limits  COMPREHENSIVE METABOLIC PANEL - Abnormal; Notable for the following:    Glucose, Bld 103 (*)    Calcium 8.8 (*)    Albumin 3.3 (*)    Total Bilirubin 0.1 (*)    All other components within normal limits  URINALYSIS, ROUTINE W REFLEX MICROSCOPIC (NOT AT [facility]) - Abnormal; Notable for the following:    APPearance CLOUDY (*)    Hgb urine dipstick SMALL (*)    Nitrite POSITIVE (*)    Leukocytes, UA MODERATE (*)    All other components within normal limits  URINE MICROSCOPIC-ADD ON - Abnormal; Notable for the following:    Squamous Epithelial / LPF FEW (*)    Bacteria, UA MANY (*)    All other components within normal limits  URINE CULTURE  LIPASE, BLOOD    Imaging Review Dg Abd Acute W/chest  03/04/2015   CLINICAL DATA:  Abdominal pain and nausea for 2 days.  EXAM: DG ABDOMEN ACUTE W/ 1V CHEST  COMPARISON:  None.  FINDINGS: There is no evidence of dilated bowel loops or free intraperitoneal air. No radiopaque calculi or other significant radiographic abnormality is seen. Heart size and mediastinal contours are within normal limits. Both lungs are clear, with moderate hyperinflation and probable upper lobe emphysematous changes.  IMPRESSION: Negative abdominal radiographs.  No acute cardiopulmonary disease.   Electronically Signed   By: Ellery Plunk M.D.   On: 03/04/2015 21:28   I have personally reviewed and evaluated these images and lab results as part of my medical decision-making.   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 I feel a majority of her symptoms very likely secondary to opiate withdrawal. She states that she is awaiting  her primary care physician to refer her to pain management. For various reasons she missed 3 consecutive appointments and her previous pain management and has been dismissed from the practice. Some of her symptoms are obviously likely related to her urinary tract infection. Is been given fluids antiemetics and to spasmodics and 2 diarrheas and antibiotics. Discharged home with his with Cipro, Bentyl, Zofran, Lomotil  I personally performed the services described in this documentation, which was scribed in my presence. The recorded information has been reviewed and is accurate.    Rolland Porter, MD 03/04/15 2250

## 2015-03-08 LAB — URINE CULTURE: Culture: >=100000

## 2015-03-09 ENCOUNTER — Telehealth (HOSPITAL_BASED_OUTPATIENT_CLINIC_OR_DEPARTMENT_OTHER): Payer: Self-pay | Admitting: Emergency Medicine

## 2015-03-09 NOTE — Progress Notes (Signed)
ED Antimicrobial Stewardship Positive Culture Follow Up   Patricia Sutton is an 69 y.o. female who presented to [facility] on 03/04/2015 with a chief complaint of  Chief Complaint  Patient presents with  . Abdominal Pain    Recent Results (from the past 720 hour(s))  Urine culture     Status: None   Collection Time: 03/04/15  8:40 PM  Result Value Ref Range Status   Specimen Description URINE, CATHETERIZED  Final   Special Requests NONE  Final   Culture   Final    >=100,000 COLONIES/mL ESCHERICHIA COLI Performed at [facility]    Report Status 03/08/2015 FINAL  Final   Organism ID, Bacteria ESCHERICHIA COLI  Final      Susceptibility   Escherichia coli - MIC*    AMPICILLIN >=32 RESISTANT Resistant     CEFAZOLIN 16 SENSITIVE Sensitive     CEFTRIAXONE <=1 SENSITIVE Sensitive     CIPROFLOXACIN >=4 RESISTANT Resistant     GENTAMICIN <=1 SENSITIVE Sensitive     IMIPENEM <=0.25 SENSITIVE Sensitive     NITROFURANTOIN <=16 SENSITIVE Sensitive     TRIMETH/SULFA <=20 SENSITIVE Sensitive     AMPICILLIN/SULBACTAM 16 INTERMEDIATE Intermediate     PIP/TAZO <=4 SENSITIVE Sensitive     * >=100,000 COLONIES/mL ESCHERICHIA COLI     Treated with Ciprofloxacin, organism resistant to prescribed antimicrobial  New antibiotic prescription: Bactrim DS 800/160 bid for 3 days  ED Provider: [name], PA-C  Assessment: 49 yoF admitted with complaints of abdominal pain and intermitted dysuria. Hx of chronic pain and thought to be in opioid withdrawal. Discharged on Ciprofloxacin  bid x 5 days. UCx now growing E.coli resistant to Cipro. Will stop cipro and change therapy to Bactrim DS 800/160 bid for 3 days. NKDA  [name], PharmD Clinical Pharmacist- Resident Pager: [phone]  [name] 03/09/2015, 8:49 AM Infectious Diseases Pharmacist Phone# [phone]

## 2015-03-10 ENCOUNTER — Telehealth (HOSPITAL_BASED_OUTPATIENT_CLINIC_OR_DEPARTMENT_OTHER): Payer: Self-pay | Admitting: Emergency Medicine

## 2015-03-10 NOTE — Telephone Encounter (Signed)
Post ED Visit - Positive Culture Follow-up: Successful Patient Follow-Up  Culture assessed and recommendations reviewed by: []  Celedonio MiyamotoJeremy Frens, Pharm.D., BCPS-AQ ID []  Georgina PillionElizabeth Martin, Pharm.D., BCPS []  Mill CreekMinh Pham, VermontPharm.D., BCPS, AAHIVP []  Estella HuskMichelle Turner, Pharm.D., BCPS, AAHIVP []  Colgate PalmoliveCristy Reyes, [address] Rainbow BoulevardPharm.D. []  Tennis Mustassie Stewart, Pharm.D. Lisette GrinderAlyson Leonard PharmD  Positive urine culture E. coli  []  Patient discharged without antimicrobial prescription and treatment is now indicated [x]  Organism is resistant to prescribed ED discharge antimicrobial []  Patient with positive blood cultures  Changes discussed with ED provider: Trixie DredgeEmily West PA New antibiotic prescription Bactrim DS 800/160mg  bid x [facility] [address] [phone]  Contacted patient, 03/10/15 1341   Berle MullMiller, Ksenia Kunz 03/10/2015, 1:39 PM

## 2015-03-14 ENCOUNTER — Encounter (HOSPITAL_BASED_OUTPATIENT_CLINIC_OR_DEPARTMENT_OTHER): Payer: Self-pay | Admitting: Emergency Medicine

## 2015-03-14 ENCOUNTER — Inpatient Hospital Stay (HOSPITAL_BASED_OUTPATIENT_CLINIC_OR_DEPARTMENT_OTHER)
Admission: EM | Admit: 2015-03-14 | Discharge: 2015-03-16 | DRG: 641 | Disposition: A | Discharge status: Home / Self Care | Payer: Medicare Other | Attending: Internal Medicine | Admitting: Internal Medicine

## 2015-03-14 ENCOUNTER — Emergency Department (HOSPITAL_BASED_OUTPATIENT_CLINIC_OR_DEPARTMENT_OTHER): Payer: Medicare Other

## 2015-03-14 DIAGNOSIS — Z79891 Long term (current) use of opiate analgesic: Secondary | ICD-10-CM

## 2015-03-14 DIAGNOSIS — K5900 Constipation, unspecified: Secondary | ICD-10-CM | POA: Diagnosis present

## 2015-03-14 DIAGNOSIS — F17200 Nicotine dependence, unspecified, uncomplicated: Secondary | ICD-10-CM | POA: Diagnosis present

## 2015-03-14 DIAGNOSIS — K3184 Gastroparesis: Secondary | ICD-10-CM | POA: Diagnosis present

## 2015-03-14 DIAGNOSIS — F329 Major depressive disorder, single episode, unspecified: Secondary | ICD-10-CM | POA: Diagnosis present

## 2015-03-14 DIAGNOSIS — K219 Gastro-esophageal reflux disease without esophagitis: Secondary | ICD-10-CM | POA: Diagnosis present

## 2015-03-14 DIAGNOSIS — R7989 Other specified abnormal findings of blood chemistry: Secondary | ICD-10-CM | POA: Diagnosis not present

## 2015-03-14 DIAGNOSIS — K2970 Gastritis, unspecified, without bleeding: Secondary | ICD-10-CM | POA: Diagnosis present

## 2015-03-14 DIAGNOSIS — I248 Other forms of acute ischemic heart disease: Secondary | ICD-10-CM | POA: Diagnosis present

## 2015-03-14 DIAGNOSIS — N179 Acute kidney failure, unspecified: Secondary | ICD-10-CM

## 2015-03-14 DIAGNOSIS — E860 Dehydration: Secondary | ICD-10-CM | POA: Diagnosis present

## 2015-03-14 DIAGNOSIS — F419 Anxiety disorder, unspecified: Secondary | ICD-10-CM | POA: Diagnosis present

## 2015-03-14 DIAGNOSIS — Z1623 Resistance to quinolones and fluoroquinolones: Secondary | ICD-10-CM | POA: Diagnosis present

## 2015-03-14 DIAGNOSIS — G4730 Sleep apnea, unspecified: Secondary | ICD-10-CM | POA: Diagnosis present

## 2015-03-14 DIAGNOSIS — J438 Other emphysema: Secondary | ICD-10-CM | POA: Diagnosis not present

## 2015-03-14 DIAGNOSIS — Z96651 Presence of right artificial knee joint: Secondary | ICD-10-CM | POA: Diagnosis present

## 2015-03-14 DIAGNOSIS — E039 Hypothyroidism, unspecified: Secondary | ICD-10-CM | POA: Diagnosis present

## 2015-03-14 DIAGNOSIS — R109 Unspecified abdominal pain: Secondary | ICD-10-CM

## 2015-03-14 DIAGNOSIS — Z9981 Dependence on supplemental oxygen: Secondary | ICD-10-CM | POA: Diagnosis not present

## 2015-03-14 DIAGNOSIS — Z86010 Personal history of colonic polyps: Secondary | ICD-10-CM

## 2015-03-14 DIAGNOSIS — G4700 Insomnia, unspecified: Secondary | ICD-10-CM | POA: Diagnosis present

## 2015-03-14 DIAGNOSIS — N390 Urinary tract infection, site not specified: Secondary | ICD-10-CM

## 2015-03-14 DIAGNOSIS — J449 Chronic obstructive pulmonary disease, unspecified: Secondary | ICD-10-CM | POA: Diagnosis present

## 2015-03-14 DIAGNOSIS — R079 Chest pain, unspecified: Secondary | ICD-10-CM | POA: Diagnosis not present

## 2015-03-14 DIAGNOSIS — G894 Chronic pain syndrome: Secondary | ICD-10-CM | POA: Diagnosis present

## 2015-03-14 DIAGNOSIS — R778 Other specified abnormalities of plasma proteins: Secondary | ICD-10-CM | POA: Diagnosis present

## 2015-03-14 HISTORY — DX: Disorder of kidney and ureter, unspecified: N28.9

## 2015-03-14 HISTORY — DX: Unspecified asthma, uncomplicated: J45.909

## 2015-03-14 LAB — COMPREHENSIVE METABOLIC PANEL
ALT: 16 U/L (ref 14–54)
AST: 26 U/L (ref 15–41)
Albumin: 3.8 g/dL (ref 3.5–5.0)
Alkaline Phosphatase: 84 U/L (ref 38–126)
Anion gap: 8 (ref 5–15)
BILIRUBIN TOTAL: 0.2 mg/dL — AB (ref 0.3–1.2)
BUN: 12 mg/dL (ref 6–20)
CHLORIDE: 102 mmol/L (ref 101–111)
CO2: 25 mmol/L (ref 22–32)
Calcium: 8.9 mg/dL (ref 8.9–10.3)
Creatinine, Ser: 1.15 mg/dL — ABNORMAL HIGH (ref 0.44–1.00)
GFR calc Af Amer: 56 mL/min — ABNORMAL LOW (ref >60)
GFR, EST NON AFRICAN AMERICAN: 48 mL/min — AB (ref >60)
Glucose, Bld: 150 mg/dL — ABNORMAL HIGH (ref 65–99)
Potassium: 3.9 mmol/L (ref 3.5–5.1)
Sodium: 135 mmol/L (ref 135–145)
Total Protein: 7.4 g/dL (ref 6.5–8.1)

## 2015-03-14 LAB — DIFFERENTIAL
BASOS ABS: 0 10*3/uL (ref 0.0–0.1)
BASOS PCT: 0 %
EOS ABS: 0.1 10*3/uL (ref 0.0–0.7)
Eosinophils Relative: 1 %
Lymphocytes Relative: 39 %
Lymphs Abs: 4.3 10*3/uL — ABNORMAL HIGH (ref 0.7–4.0)
MONOS PCT: 9 %
Monocytes Absolute: 1 10*3/uL (ref 0.1–1.0)
Neutro Abs: 5.4 10*3/uL (ref 1.7–7.7)
Neutrophils Relative %: 51 %

## 2015-03-14 LAB — URINALYSIS, ROUTINE W REFLEX MICROSCOPIC
Bilirubin Urine: NEGATIVE
GLUCOSE, UA: NEGATIVE mg/dL
Hgb urine dipstick: NEGATIVE
KETONES UR: NEGATIVE mg/dL
NITRITE: NEGATIVE
Protein, ur: NEGATIVE mg/dL
Specific Gravity, Urine: 1.007 (ref 1.005–1.030)
Urobilinogen, UA: 0.2 mg/dL (ref 0.0–1.0)
pH: 5.5 (ref 5.0–8.0)

## 2015-03-14 LAB — CBC
HCT: 39.5 % (ref 36.0–46.0)
Hemoglobin: 12.9 g/dL (ref 12.0–15.0)
MCH: 29.7 pg (ref 26.0–34.0)
MCHC: 32.7 g/dL (ref 30.0–36.0)
MCV: 90.8 fL (ref 78.0–100.0)
Platelets: 309 10*3/uL (ref 150–400)
RBC: 4.35 MIL/uL (ref 3.87–5.11)
RDW: 15.3 % (ref 11.5–15.5)
WBC: 10.8 10*3/uL — AB (ref 4.0–10.5)

## 2015-03-14 LAB — LIPASE, BLOOD: LIPASE: 28 U/L (ref 22–51)

## 2015-03-14 LAB — TROPONIN I: TROPONIN I: 0.12 ng/mL — AB (ref <0.031)

## 2015-03-14 LAB — URINE MICROSCOPIC-ADD ON

## 2015-03-14 LAB — I-STAT CG4 LACTIC ACID, ED: Lactic Acid, Venous: 0.75 mmol/L (ref 0.5–2.0)

## 2015-03-14 LAB — BRAIN NATRIURETIC PEPTIDE: B Natriuretic Peptide: 26.3 pg/mL (ref 0.0–100.0)

## 2015-03-14 MED ORDER — MORPHINE SULFATE (PF) 4 MG/ML IV SOLN
4.0000 mg | Freq: Once | INTRAVENOUS | Status: AC
  Administered 2015-03-14: 4 mg via INTRAVENOUS
  Filled 2015-03-14: qty 1

## 2015-03-14 MED ORDER — LORAZEPAM 2 MG/ML IJ SOLN
1.0000 mg | Freq: Once | INTRAMUSCULAR | Status: AC
  Administered 2015-03-14: 1 mg via INTRAVENOUS
  Filled 2015-03-14: qty 1

## 2015-03-14 MED ORDER — ASPIRIN 81 MG PO CHEW
324.0000 mg | CHEWABLE_TABLET | Freq: Once | ORAL | Status: AC
  Administered 2015-03-14: 324 mg via ORAL
  Filled 2015-03-14: qty 4

## 2015-03-14 MED ORDER — CEFTRIAXONE SODIUM 1 G IJ SOLR
1.0000 g | Freq: Once | INTRAMUSCULAR | Status: AC
  Administered 2015-03-14: 1 g via INTRAVENOUS

## 2015-03-14 MED ORDER — NICOTINE 21 MG/24HR TD PT24
21.0000 mg | MEDICATED_PATCH | Freq: Every day | TRANSDERMAL | Status: DC
  Administered 2015-03-14: 21 mg via TRANSDERMAL
  Filled 2015-03-14: qty 1

## 2015-03-14 MED ORDER — CEFTRIAXONE SODIUM 1 G IJ SOLR
INTRAMUSCULAR | Status: AC
  Filled 2015-03-14: qty 10

## 2015-03-14 MED ORDER — ONDANSETRON HCL 4 MG/2ML IJ SOLN
4.0000 mg | Freq: Once | INTRAMUSCULAR | Status: AC
  Administered 2015-03-14: 4 mg via INTRAVENOUS
  Filled 2015-03-14: qty 2

## 2015-03-14 MED ORDER — SODIUM CHLORIDE 0.9 % IV BOLUS (SEPSIS)
1000.0000 mL | Freq: Once | INTRAVENOUS | Status: AC
  Administered 2015-03-14: 1000 mL via INTRAVENOUS

## 2015-03-14 NOTE — ED Notes (Signed)
[facility], pt wanting to go home and not be admitted, rationale given with encouragement, husband present.

## 2015-03-14 NOTE — ED Provider Notes (Signed)
CSN: [id number]     Arrival date & time 03/14/15  1939 History   First MD Initiated Contact with Patient 03/14/15 1949     Chief Complaint  Patient presents with  . Emesis     (Consider location/radiation/quality/duration/timing/severity/associated sxs/prior Treatment) HPI   Blood pressure 135/64, pulse 67, temperature 98.3 F (36.8 C), temperature source Oral, resp. rate 18, height  (1.6 m), weight 110 lb (49.896 kg), SpO2 99 %.  Patricia Sutton is a 69 y.o. female with past medical history significant for COPD, tobacco use complaining of 4 episodes of non-bloody, nonbilious, coffee-ground emesis onset this morning with associated epigastric pain and generalized weakness patient was seen last week and diagnosed with UTI, states that they change her antibiotic but she finished them 3 days ago. States that she was feeling good at that time however the dysuria frequency and generalized weakness with associated chills started the day after she finished her antibiotics, 2 days ago. Patient has chronic low back pain but she has a left-sided pain which she states is atypical for her she was dismissed from her prior pain management clinic but states that she saw her pain management doctor earlier this last week and has oxycodone at home. Patient endorses dyspnea on exertion on review of systems. She denies cough above her baseline, increasing peripheral edema. Patient has never seen a cardiologist, denies diabetes, hypertension, hyperlipidemia. Active daily smoker.   Past Medical History  Diagnosis Date  . COPD (chronic obstructive pulmonary disease) (HCC)     denies SOB  . Sleep apnea     no CPAP; uses O2 at night 1l/min.  . Bone spur of other site     spine  . Insomnia     takes Trazodone nightly and Ambien  . Hypothyroidism     takes Synthroid daily  . PONV (postoperative nausea and vomiting)   . Pneumonia     hx of;last time about 6+yrs ago  . History of bronchitis     last time  about 6+yrs ago  . Arthritis   . Joint pain   . Joint swelling   . Chronic back pain     spurs on spine  . GERD (gastroesophageal reflux disease)     takes Omeprazole daily  . History of colon polyps   . Urinary urgency   . History of kidney stones   . Nocturia   . Depression     takes Effexor daily  . Anxiety    Past Surgical History  Procedure Laterality Date  . Abdominal hysterectomy      complete  . Knee arthroscopy Right 07/16/2012    Procedure: ARTHROSCOPY KNEE;  Surgeon: Kerrin Champagne, MD;  Location: [facility];  Service: Orthopedics;  Laterality: Right;  Right knee arthroscopy with chondroplastic shaving of retropatella surface  . Colonoscopy    . Esophagogastroduodenoscopy    . Bladder tacked    . Knee arthroplasty Right 07/18/2013    Procedure: COMPUTER ASSISTED RIGHT TOTAL KNEE ARTHROPLASTY;  Surgeon: Kerrin Champagne, MD;  Location: [facility] OR;  Service: Orthopedics;  Laterality: Right;   No family history on file. Social History  Substance Use Topics  . Smoking status: Current Every Day Smoker -- 1.00 packs/day for 40 years    Types: Cigarettes  . Smokeless tobacco: Never Used     Comment: nothing since 07/11/13  . Alcohol Use: No   OB History    No data available     Review of  Systems  10 systems reviewed and found to be negative, except as noted in the HPI.   Allergies  Review of patient's allergies indicates no known allergies.  Home Medications   Prior to Admission medications   Medication Sig Start Date End Date Taking? Authorizing Provider  albuterol (PROVENTIL) (2.5 MG/3ML) 0.083% nebulizer solution Take 2.5 mg by nebulization every 4 (four) hours as needed for wheezing or shortness of breath.    Historical Provider, MD  aspirin EC 325 MG EC tablet Take 1 tablet (325 mg total) by mouth 2 (two) times daily after a meal. 07/20/13   Kerrin Champagne, MD  budesonide-formoterol [facility]) 160-4.5 MCG/ACT inhaler Inhale 2 puffs into the lungs 2  (two) times daily.    Historical Provider, MD  ciprofloxacin (CIPRO) 500 MG tablet Take 1 tablet (500 mg total) by mouth every 12 (twelve) hours. 03/04/15   Rolland Porter, MD  diclofenac sodium (VOLTAREN) 1 % GEL Apply 2-5 g topically 3 (three) times daily as needed (pain).    Historical Provider, MD  dicyclomine (BENTYL) 20 MG tablet Take 1 tablet (20 mg total) by mouth 2 (two) times daily. 03/04/15   Rolland Porter, MD  diphenoxylate-atropine (LOMOTIL) 2.5-0.025 MG tablet Take 1 tablet by mouth 4 (four) times daily as needed for diarrhea or loose stools. 03/04/15   Rolland Porter, MD  gabapentin (NEURONTIN) 300 MG capsule Take 600 mg by mouth 2 (two) times daily. At noon and at bedtime    Historical Provider, MD  levothyroxine (SYNTHROID, LEVOTHROID) 25 MCG tablet Take 25 mcg by mouth daily before breakfast.    Historical Provider, MD  lidocaine (XYLOCAINE) 2 % jelly Apply 1 application topically 2 (two) times daily as needed (pain). Apply 1/4 inch to knee as needed    Historical Provider, MD  methocarbamol (ROBAXIN) 500 MG tablet Take 1 tablet (500 mg total) by mouth every 8 (eight) hours as needed for muscle spasms. 07/20/13   Kerrin Champagne, MD  omeprazole (PRILOSEC) 20 MG capsule Take 20 mg by mouth 2 (two) times daily.    Historical Provider, MD  ondansetron (ZOFRAN ODT) 4 MG disintegrating tablet Take 1 tablet (4 mg total) by mouth every 8 (eight) hours as needed for nausea. 03/04/15   Rolland Porter, MD  oxyCODONE (OXY IR/ROXICODONE) 5 MG immediate release tablet Take 1-2 tablets (5-10 mg total) by mouth every 3 (three) hours as needed for severe pain or breakthrough pain. 07/20/13   Kerrin Champagne, MD  oxyCODONE (ROXICODONE) 15 MG immediate release tablet Take 1 tablet (15 mg total) by mouth every 6 (six) hours as needed for pain (Baseline pain control). 07/20/13   Kerrin Champagne, MD  promethazine (PHENERGAN) 25 MG tablet Take 25 mg by mouth 3 (three) times daily as needed for nausea or vomiting.    Historical  Provider, MD  traZODone (DESYREL) 100 MG tablet Take 100 mg by mouth at bedtime.     Historical Provider, MD  venlafaxine XR (EFFEXOR-XR) 150 MG 24 hr capsule Take 150 mg by mouth daily.    Historical Provider, MD  venlafaxine XR (EFFEXOR-XR) 37.5 MG 24 hr capsule Take 37.5 mg by mouth daily.    Historical Provider, MD   BP 152/99 mmHg  Pulse 68  Temp(Src) 98.3 F (36.8 C) (Oral)  Resp 18  Ht 5\' 3"  (1.6 m)  Wt 110 lb (49.896 kg)  BMI 19.49 kg/m2  SpO2 97% Physical Exam  Constitutional: She is oriented to person, place, and time. She appears  well-developed and well-nourished. No distress.  Appears much older than stated age.  HENT:  Head: Normocephalic.  Extremely dry mucous membranes  Eyes: Conjunctivae and EOM are normal.  Neck: Normal range of motion. No JVD present. No tracheal deviation present.  Cardiovascular: Normal rate, regular rhythm and intact distal pulses.   Radial pulse equal bilaterally  Pulmonary/Chest: Effort normal and breath sounds normal. No stridor. No respiratory distress. She has no wheezes. She has no rales. She exhibits no tenderness.  Abdominal: Soft. Bowel sounds are normal. She exhibits no distension and no mass. There is tenderness. There is no rebound and no guarding.  Epigastric TTP without guarding or rebound  Genitourinary:  No CVA tenderness to percussion bilaterally  Musculoskeletal: Normal range of motion. She exhibits no edema or tenderness.  No calf asymmetry, superficial collaterals, palpable cords, edema, Homans sign negative bilaterally.    Neurological: She is alert and oriented to person, place, and time.  Skin: Skin is warm. She is not diaphoretic.  Psychiatric: She has a normal mood and affect.  Nursing note and vitals reviewed.   ED Course  Procedures (including critical care time) Labs Review Labs Reviewed  COMPREHENSIVE METABOLIC PANEL - Abnormal; Notable for the following:    Glucose, Bld 150 (*)    Creatinine, Ser 1.15 (*)     Total Bilirubin 0.2 (*)    GFR calc non Af Amer 48 (*)    GFR calc Af Amer 56 (*)    All other components within normal limits  CBC - Abnormal; Notable for the following:    WBC 10.8 (*)    All other components within normal limits  URINALYSIS, ROUTINE W REFLEX MICROSCOPIC (NOT AT [facility]) - Abnormal; Notable for the following:    Leukocytes, UA MODERATE (*)    All other components within normal limits  DIFFERENTIAL - Abnormal; Notable for the following:    Lymphs Abs 4.3 (*)    All other components within normal limits  TROPONIN I - Abnormal; Notable for the following:    Troponin I 0.12 (*)    All other components within normal limits  URINE MICROSCOPIC-ADD ON - Abnormal; Notable for the following:    Squamous Epithelial / LPF FEW (*)    Bacteria, UA FEW (*)    Crystals CA OXALATE CRYSTALS (*)    All other components within normal limits  URINE CULTURE  LIPASE, BLOOD  BRAIN NATRIURETIC PEPTIDE  I-STAT CG4 LACTIC ACID, ED    Imaging Review Dg Chest 2 View  03/14/2015  CLINICAL DATA:  Cough EXAM: CHEST  2 VIEW COMPARISON:  03/04/2015 FINDINGS: Normal heart size and mediastinal contours. Stable and symmetric biapical pleural thickening. Chronic central bronchial wall thickening, likely related to patient's history of smoking. No acute infiltrate or edema. No effusion or pneumothorax. No acute osseous findings. IMPRESSION: No evidence of acute cardiopulmonary disease. Chronic central bronchitic markings. Electronically Signed   By: Marnee SpringJonathon  Watts M.D.   On: 03/14/2015 20:55   I have personally reviewed and evaluated these images and lab results as part of my medical decision-making.   EKG Interpretation   Date/Time:  Sunday March 14 2015 20:51:12 EDT Ventricular Rate:  60 PR Interval:  126 QRS Duration: 86 QT Interval:  392 QTC Calculation: 392 R Axis:   -56 Text Interpretation:  Normal sinus rhythm Left axis deviation Septal  infarct , age undetermined Abnormal ECG No  significant change was found  Confirmed by Manus GunningANCOUR  MD, STEPHEN [id number]) on 03/14/2015 8:51:22 PM  MDM   Final diagnoses:  UTI (lower urinary tract infection)  AKI (acute kidney injury) (HCC)  Elevated troponin  Tobacco use disorder    Filed Vitals:   03/14/15 2200 03/14/15 2215 03/14/15 2230 03/14/15 2245  BP: 151/78 143/74 159/81 152/99  Pulse: 65 60 62 68  Temp:      TempSrc:      Resp:      Height:      Weight:      SpO2: 94% 95% 95% 97%    Medications  cefTRIAXone (ROCEPHIN) 1 G injection (not administered)  ondansetron (ZOFRAN) injection 4 mg (4 mg Intravenous Given 03/14/15 2008)  sodium chloride 0.9 % bolus 1,000 mL (0 mLs Intravenous Stopped 03/14/15 2145)  aspirin chewable tablet 324 mg (324 mg Oral Given 03/14/15 2146)  cefTRIAXone (ROCEPHIN) 1 g in dextrose 5 % 50 mL IVPB (1 g Intravenous New Bag/Given 03/14/15 2224)  morphine 4 MG/ML injection 4 mg (4 mg Intravenous Given 03/14/15 2223)    AURORA RODY is 69 y.o. female presenting with generalized weakness, dyspnea on exertion multiple episodes of emesis and epigastric pain. Patient afebrile, no focal CVA tenderness. Abdominal exam nonsurgical. We'll get basic blood work, recheck urine IV fluids, pain and nausea medication. Because patient is having epigastric abdominal pain with associated dyspnea on exertion and generalized weakness will also get cardiac evaluation.  EKG with no acute changes, chest x-ray negative. Patient has elevated white blood cell count 10.8 normal last acid level. Urinalysis is consistent with infection, we'll culture. Patient will be started on Rocephin. Previous urine culture shows sensitivity. Patient's creatinine is elevated at 1.15, she does appear significantly clinically dehydrated.  Patient's troponin is slightly elevated. His never had a cardiac evaluation in the past. Patient is chest pain-free but reporting dyspnea on exertion, I feel that her epigastric pain may be an  anginal equivalent. Patient given aspirin.  This is a shared visit with the attending physician who personally evaluated the patient and agrees with the care plan.   Case discussed with Dr. Gilford Silvius who accepts admission and transfer to [facility].        Wynetta Emery, PA-C 03/14/15 1610  Glynn Octave, MD 03/15/15 1031

## 2015-03-14 NOTE — ED Notes (Signed)
EDPA into room, at BS.  

## 2015-03-14 NOTE — ED Notes (Signed)
C/o back pain, abd pain, nv, weak, dizzy with standing, states, "here last week for kidney infection, finished abx yesterday, sx seem worse", rates pain at this time 10/10. (denies: fever or diarrhea), admits to constipation, last BM today with straining. Describes abd as bloated and distended.

## 2015-03-14 NOTE — ED Notes (Signed)
No changes, Dr. Manus Gunningancour into room, husband into room, pt alert, NAD, calm, interactive, allowed to drink, PO challenge.

## 2015-03-14 NOTE — ED Notes (Signed)
Pt reluctantly agreeable to admission. Pt wanting a cigarette. No smoking explained with rationale.

## 2015-03-14 NOTE — ED Notes (Signed)
Pt in xray

## 2015-03-14 NOTE — ED Notes (Signed)
Patient reports that she continues to have n/ v since she left here about a week ago. Reports that she finished all of her medications yesterday

## 2015-03-15 ENCOUNTER — Encounter (HOSPITAL_COMMUNITY): Payer: Self-pay | Admitting: Family Medicine

## 2015-03-15 ENCOUNTER — Observation Stay (HOSPITAL_COMMUNITY): Payer: Medicare Other

## 2015-03-15 DIAGNOSIS — F17200 Nicotine dependence, unspecified, uncomplicated: Secondary | ICD-10-CM | POA: Diagnosis present

## 2015-03-15 DIAGNOSIS — E039 Hypothyroidism, unspecified: Secondary | ICD-10-CM | POA: Diagnosis present

## 2015-03-15 DIAGNOSIS — E860 Dehydration: Secondary | ICD-10-CM

## 2015-03-15 DIAGNOSIS — J438 Other emphysema: Secondary | ICD-10-CM

## 2015-03-15 DIAGNOSIS — R7989 Other specified abnormal findings of blood chemistry: Secondary | ICD-10-CM

## 2015-03-15 DIAGNOSIS — G894 Chronic pain syndrome: Secondary | ICD-10-CM | POA: Diagnosis present

## 2015-03-15 DIAGNOSIS — J449 Chronic obstructive pulmonary disease, unspecified: Secondary | ICD-10-CM | POA: Diagnosis present

## 2015-03-15 DIAGNOSIS — E038 Other specified hypothyroidism: Secondary | ICD-10-CM

## 2015-03-15 DIAGNOSIS — N390 Urinary tract infection, site not specified: Secondary | ICD-10-CM | POA: Diagnosis present

## 2015-03-15 LAB — BASIC METABOLIC PANEL WITH GFR
Anion gap: 7 (ref 5–15)
BUN: 7 mg/dL (ref 6–20)
CO2: 29 mmol/L (ref 22–32)
Calcium: 8.3 mg/dL — ABNORMAL LOW (ref 8.9–10.3)
Chloride: 103 mmol/L (ref 101–111)
Creatinine, Ser: 0.97 mg/dL (ref 0.44–1.00)
GFR calc Af Amer: >60 mL/min
GFR calc non Af Amer: 59 mL/min — ABNORMAL LOW
Glucose, Bld: 93 mg/dL (ref 65–99)
Potassium: 4 mmol/L (ref 3.5–5.1)
Sodium: 139 mmol/L (ref 135–145)

## 2015-03-15 LAB — CBC
HCT: 34.8 % — ABNORMAL LOW (ref 36.0–46.0)
Hemoglobin: 11.1 g/dL — ABNORMAL LOW (ref 12.0–15.0)
MCH: 29.3 pg (ref 26.0–34.0)
MCHC: 31.9 g/dL (ref 30.0–36.0)
MCV: 91.8 fL (ref 78.0–100.0)
Platelets: 261 K/uL (ref 150–400)
RBC: 3.79 MIL/uL — ABNORMAL LOW (ref 3.87–5.11)
RDW: 15.6 % — ABNORMAL HIGH (ref 11.5–15.5)
WBC: 8.1 K/uL (ref 4.0–10.5)

## 2015-03-15 LAB — CREATININE, SERUM
Creatinine, Ser: 0.94 mg/dL (ref 0.44–1.00)
GFR calc Af Amer: >60 mL/min
GFR calc non Af Amer: >60 mL/min

## 2015-03-15 LAB — TROPONIN I
Troponin I: 0.1 ng/mL — ABNORMAL HIGH (ref <0.031)
Troponin I: 0.1 ng/mL — ABNORMAL HIGH (ref <0.031)
Troponin I: 0.09 ng/mL — ABNORMAL HIGH (ref <0.031)

## 2015-03-15 MED ORDER — OXYCODONE HCL 5 MG PO TABS
5.0000 mg | ORAL_TABLET | ORAL | Status: DC | PRN
  Administered 2015-03-15 – 2015-03-16 (×3): 5 mg via ORAL
  Filled 2015-03-15 (×4): qty 1

## 2015-03-15 MED ORDER — PROMETHAZINE HCL 25 MG/ML IJ SOLN: 6.2500 mg | Freq: Four times a day (QID) | INTRAMUSCULAR | Status: DC | PRN

## 2015-03-15 MED ORDER — BUDESONIDE-FORMOTEROL FUMARATE 160-4.5 MCG/ACT IN AERO
2.0000 | INHALATION_SPRAY | Freq: Two times a day (BID) | RESPIRATORY_TRACT | Status: DC
  Filled 2015-03-15 (×2): qty 6

## 2015-03-15 MED ORDER — IOHEXOL 300 MG/ML  SOLN
100.0000 mL | Freq: Once | INTRAMUSCULAR | Status: AC | PRN
  Administered 2015-03-15: 100 mL via INTRAVENOUS

## 2015-03-15 MED ORDER — ONDANSETRON HCL 4 MG/2ML IJ SOLN
4.0000 mg | Freq: Four times a day (QID) | INTRAMUSCULAR | Status: DC | PRN
  Administered 2015-03-15: 4 mg via INTRAVENOUS
  Filled 2015-03-15: qty 2

## 2015-03-15 MED ORDER — SODIUM CHLORIDE 0.9 % IV SOLN
INTRAVENOUS | Status: DC
  Administered 2015-03-15 – 2015-03-16 (×3): via INTRAVENOUS

## 2015-03-15 MED ORDER — MORPHINE SULFATE (PF) 2 MG/ML IV SOLN: 2.0000 mg | INTRAVENOUS | Status: DC | PRN

## 2015-03-15 MED ORDER — LORAZEPAM 1 MG PO TABS
1.0000 mg | ORAL_TABLET | Freq: Once | ORAL | Status: AC
  Administered 2015-03-15: 1 mg via ORAL
  Filled 2015-03-15: qty 1

## 2015-03-15 MED ORDER — LEVOTHYROXINE SODIUM 25 MCG PO TABS
25.0000 ug | ORAL_TABLET | Freq: Every day | ORAL | Status: DC
  Administered 2015-03-15 – 2015-03-16 (×2): 25 ug via ORAL
  Filled 2015-03-15 (×2): qty 1

## 2015-03-15 MED ORDER — VENLAFAXINE HCL ER 150 MG PO CP24
150.0000 mg | ORAL_CAPSULE | Freq: Every day | ORAL | Status: DC
  Administered 2015-03-15 – 2015-03-16 (×2): 150 mg via ORAL
  Filled 2015-03-15 (×2): qty 1

## 2015-03-15 MED ORDER — CLONAZEPAM 0.5 MG PO TABS
0.2500 mg | ORAL_TABLET | Freq: Two times a day (BID) | ORAL | Status: DC | PRN
  Administered 2015-03-15 – 2015-03-16 (×2): 0.25 mg via ORAL
  Filled 2015-03-15 (×2): qty 1

## 2015-03-15 MED ORDER — ALBUTEROL SULFATE (2.5 MG/3ML) 0.083% IN NEBU: 2.5000 mg | INHALATION_SOLUTION | RESPIRATORY_TRACT | Status: DC | PRN

## 2015-03-15 MED ORDER — PROMETHAZINE HCL 25 MG PO TABS: 12.5000 mg | ORAL_TABLET | Freq: Four times a day (QID) | ORAL | Status: DC | PRN

## 2015-03-15 MED ORDER — ENOXAPARIN SODIUM 30 MG/0.3ML ~~LOC~~ SOLN
30.0000 mg | SUBCUTANEOUS | Status: DC
  Administered 2015-03-15: 30 mg via SUBCUTANEOUS
  Filled 2015-03-15: qty 0.3

## 2015-03-15 MED ORDER — ONDANSETRON HCL 4 MG PO TABS: 4.0000 mg | ORAL_TABLET | Freq: Three times a day (TID) | ORAL | Status: DC | PRN

## 2015-03-15 MED ORDER — DEXTROSE 5 % IV SOLN
1.0000 g | INTRAVENOUS | Status: DC
  Administered 2015-03-15: 1 g via INTRAVENOUS
  Filled 2015-03-15 (×2): qty 10

## 2015-03-15 MED ORDER — ACETAMINOPHEN 325 MG PO TABS: 650.0000 mg | ORAL_TABLET | ORAL | Status: DC | PRN

## 2015-03-15 MED ORDER — GABAPENTIN 300 MG PO CAPS
600.0000 mg | ORAL_CAPSULE | Freq: Two times a day (BID) | ORAL | Status: DC
  Administered 2015-03-15 – 2015-03-16 (×3): 600 mg via ORAL
  Filled 2015-03-15 (×3): qty 2

## 2015-03-15 MED ORDER — IOHEXOL 300 MG/ML  SOLN
25.0000 mL | INTRAMUSCULAR | Status: AC
  Administered 2015-03-15 (×2): 25 mL via ORAL

## 2015-03-15 MED ORDER — PANTOPRAZOLE SODIUM 40 MG PO TBEC
40.0000 mg | DELAYED_RELEASE_TABLET | Freq: Every day | ORAL | Status: DC
  Administered 2015-03-15 – 2015-03-16 (×2): 40 mg via ORAL
  Filled 2015-03-15 (×2): qty 1

## 2015-03-15 MED ORDER — GI COCKTAIL ~~LOC~~: 30.0000 mL | Freq: Four times a day (QID) | ORAL | Status: DC | PRN

## 2015-03-15 NOTE — ED Notes (Signed)
Attempted report 

## 2015-03-15 NOTE — Progress Notes (Signed)
Pt arrived to floor in NAD, no pain, no shortness of breath at this time, VSS. Pt oriented to room and floor, call bell within reach. Admitting MD paged

## 2015-03-15 NOTE — Progress Notes (Signed)
PROGRESS NOTE  Patricia Sutton WUJ:[id number] DOB: [birth year] DOA: 03/14/2015 PCP: PROVIDER NOT IN SYSTEM  Brief history  69 year old female with a history of COPD, continued tobacco use, chronic pain syndrome, depression, hypothyroidism presented with intermittent one-week history of nausea and vomiting. Approximately 8 days prior to this admission the patient had approximately 3 days of vomiting with associated epigastric and suprapubic pain and dysuria. The patient went to the emergency department and was discharged home with Cipro. When final susceptibilities of her urine culture returned, the patient was changed to trimethoprim sulfamethoxazole which she finished 3 days of therapy one day prior to this admission. She took her last dose on 03/12/2015. Unfortunately, the patient began having nausea and vomiting with associated suprapubic and epigastric pain on 03/13/2015 with associated dizziness. which prompted her to come to the emergency department. Assessment/Plan: Dehydration -Secondary to GI losses -Continue intravenous fluids -Part of the patient's elevated creatinine is due to her recent use of trimethoprim sulfamethoxazole which inhibits tubular secretion of creatinine Epigastric and suprapubic pain with associated vomiting -Obtain CT abdomen and pelvis to rule out intra-abdominal pathology -03/14/2015 urinalysis without significant pyuria -Suspect a degree of gastritis and gastroparesis given the patient's chronic opioid use -Patient sees pain management physician in [facility] -I have explained to the patient that she will not be discharged with any prescriptions for opioids -Discontinue ceftriaxone after today's dose Elevated troponin -Demand ischemia in the setting of dehydration -EKG without any concerning ischemic changes -No angina symptoms COPD -Presently compensated, stable on room air -Unfortunately, the patient continues to smoke one half  pack per day -Tobacco cessation discussed -Continue LABA Hypothyroidism -Continue Synthroid Chronic pain syndrome -Restart the patient's home opioid regimen   Family Communication:   Husband updatedat beside Disposition Plan:   Home when medically stable       Procedures/Studies: Dg Chest 2 View  03/14/2015  CLINICAL DATA:  Cough EXAM: CHEST  2 VIEW COMPARISON:  03/04/2015 FINDINGS: Normal heart size and mediastinal contours. Stable and symmetric biapical pleural thickening. Chronic central bronchial wall thickening, likely related to patient's history of smoking. No acute infiltrate or edema. No effusion or pneumothorax. No acute osseous findings. IMPRESSION: No evidence of acute cardiopulmonary disease. Chronic central bronchitic markings. Electronically Signed   By: Marnee Spring M.D.   On: 03/14/2015 20:55   Dg Abd Acute W/chest  03/04/2015  CLINICAL DATA:  Abdominal pain and nausea for 2 days. EXAM: DG ABDOMEN ACUTE W/ 1V CHEST COMPARISON:  None. FINDINGS: There is no evidence of dilated bowel loops or free intraperitoneal air. No radiopaque calculi or other significant radiographic abnormality is seen. Heart size and mediastinal contours are within normal limits. Both lungs are clear, with moderate hyperinflation and probable upper lobe emphysematous changes. IMPRESSION: Negative abdominal radiographs.  No acute cardiopulmonary disease. Electronically Signed   By: Ellery Plunk M.D.   On: 03/04/2015 21:28        Subjective: Patient states that her vomiting has improved but continues to have some epigastric discomfort. Denies any fevers, chills, chest pain, shortness breath, diarrhea, hematochezia, melena. There is no dysuria. She still feels a little dizzy but this has improved.  Objective: Filed Vitals:   03/15/15 0000 03/15/15 0125 03/15/15 0130 03/15/15 0218  BP: 162/75 155/71 161/79 140/57  Pulse: 63 66 65 66  Temp:    98.2 F (36.8 C)  TempSrc:    Oral  Resp:   25 16 20  Height:    5\' 3"  (1.6 m)  Weight:    53.751 kg (118 lb 8 oz)  SpO2: 96% 96% 96% 98%    Intake/Output Summary (Last 24 hours) at 03/15/15 0933 Last data filed at 03/15/15 0302  Gross per 24 hour  Intake   1240 ml  Output    200 ml  Net   1040 ml   Weight change:  Exam:   General:  Pt is alert, follows commands appropriately, not in acute distress  HEENT: No icterus, No thrush, No neck mass, [place]/AT  Cardiovascular: RRR, S1/S2, no rubs, no gallops  Respiratory: Fine bibasilar crackles without any wheezing. Good air movement.  Abdomen: Soft/+BS, non tender, non distended, no guarding; no hepatosplenomegaly  Extremities: No edema, No lymphangitis, No petechiae, No rashes, no synovitis; clubbing without any cyanosis  Data Reviewed: Basic Metabolic Panel:  Recent Labs Lab 03/14/15 1800 03/15/15 0330 03/15/15 0610  NA 135  --  139  K 3.9  --  4.0  CL 102  --  103  CO2 25  --  29  GLUCOSE 150*  --  93  BUN 12  --  7  CREATININE 1.15* 0.94 0.97  CALCIUM 8.9  --  8.3*   Liver Function Tests:  Recent Labs Lab 03/14/15 1800  AST 26  ALT 16  ALKPHOS 84  BILITOT 0.2*  PROT 7.4  ALBUMIN 3.8    Recent Labs Lab 03/14/15 1800  LIPASE 28   No results for input(s): AMMONIA in the last 168 hours. CBC:  Recent Labs Lab 03/14/15 1800 03/15/15 0610  WBC 10.8* 8.1  NEUTROABS 5.4  --   HGB 12.9 11.1*  HCT 39.5 34.8*  MCV 90.8 91.8  PLT 309 261   Cardiac Enzymes:  Recent Labs Lab 03/14/15 1800 03/15/15 0330 03/15/15 0610  TROPONINI 0.12* 0.10* 0.10*   BNP: Invalid input(s): POCBNP CBG: No results for input(s): GLUCAP in the last 168 hours.  No results found for this or any previous visit (from the past 240 hour(s)).   Scheduled Meds: . budesonide-formoterol  2 puff Inhalation BID  . cefTRIAXone (ROCEPHIN)  IV  1 g Intravenous Q24H  . cefTRIAXone      . enoxaparin (LOVENOX) injection  30 mg Subcutaneous Q24H  . gabapentin  600 mg Oral  BID  . levothyroxine  25 mcg Oral QAC breakfast  . pantoprazole  40 mg Oral Daily  . venlafaxine XR  150 mg Oral Daily   Continuous Infusions: . sodium chloride 100 mL/hr at 03/15/15 0340     Arneda Sappington, DO  [facility] Pager [phone]  If 7PM-7AM, please contact night-coverage [email or web address] Password [email or web address] 03/15/2015, 9:33 AM   LOS: 1 day

## 2015-03-15 NOTE — H&P (Signed)
History and Physical  Patient Name: Patricia Sutton     ZOX:[id number]    DOB: 17-Sep-1945    DOA: 03/14/2015 Referring physician: Wynetta Emery, PA-C PCP: PROVIDER NOT IN SYSTEM      Chief Complaint: Epigastric pain and vomiting  HPI: Patricia Sutton is a 69 y.o. female with a past medical history significant for COPD, smoking, depression, chronic pain, and hypothyroidism who presents with vomiting, epigastric pain.  She initially developed dysuria, suprapubic pain and nausea vomiting 1 week ago. She presented to the ER at [facility] diagnosed with UTI and started on ciprofloxacin.  She and her family report that over the next several days she took all doses of the medicine but she felt no improvement whatsoever. Her symptoms of nausea, vomiting, suprapubic pain, dysuria waxed and waned  until she was reached by ED personnel and notified that her urine culture was resistant to ciprofloxacin and she was prescribed Bactrim which she completed.   Unfortunately, this didn't help either and in the last 3 days  the patient's nausea, vomiting, epigastric pain, and suprapubic pain asked to the point where she can't eat anything, feels weak and achy all over, and feels dizzy when she stands up.   In the ED, the patient's initial ECG normal sinus rhythm and troponin was elevated to 0.1 ng/mL.  Her serum creatinine was slightly up from previous and she appeared clinically dehydrated.  TRH was asked to admit for observation, rehydration, serial troponins and cardiovascular risk stratification.     Review of Systems:  Patient seen 3:22 AM on 03/15/2015. Pt complains vomiting, suprapubic pain, epigastric pain, weakness, decreased appetite, chills, dysuria, urinary frequency.  She denies chest discomfort/pressure, wheezing, cough, sputum, fever.  All other systems negative except as just noted or noted in the history of present illness.  No Known Allergies  Prior to Admission medications   Medication Sig  Start Date End Date Taking? Authorizing Provider  albuterol (PROVENTIL) (2.5 MG/3ML) 0.083% nebulizer solution Take 2.5 mg by nebulization every 4 (four) hours as needed for wheezing or shortness of breath.    Historical Provider, MD  aspirin EC 325 MG EC tablet Take 1 tablet (325 mg total) by mouth 2 (two) times daily after a meal. 07/20/13   Kerrin Champagne, MD  budesonide-formoterol [facility]) 160-4.5 MCG/ACT inhaler Inhale 2 puffs into the lungs 2 (two) times daily.    Historical Provider, MD  diclofenac sodium (VOLTAREN) 1 % GEL Apply 2-5 g topically 3 (three) times daily as needed (pain).    Historical Provider, MD  dicyclomine (BENTYL) 20 MG tablet Take 1 tablet (20 mg total) by mouth 2 (two) times daily. 03/04/15   Rolland Porter, MD  diphenoxylate-atropine (LOMOTIL) 2.5-0.025 MG tablet Take 1 tablet by mouth 4 (four) times daily as needed for diarrhea or loose stools. 03/04/15   Rolland Porter, MD  gabapentin (NEURONTIN) 300 MG capsule Take 600 mg by mouth 2 (two) times daily. At noon and at bedtime    Historical Provider, MD  levothyroxine (SYNTHROID, LEVOTHROID) 25 MCG tablet Take 25 mcg by mouth daily before breakfast.    Historical Provider, MD  lidocaine (XYLOCAINE) 2 % jelly Apply 1 application topically 2 (two) times daily as needed (pain). Apply 1/4 inch to knee as needed    Historical Provider, MD  methocarbamol (ROBAXIN) 500 MG tablet Take 1 tablet (500 mg total) by mouth every 8 (eight) hours as needed for muscle spasms. 07/20/13   Kerrin Champagne, MD  omeprazole (PRILOSEC)  20 MG capsule Take 20 mg by mouth 2 (two) times daily.    Historical Provider, MD  ondansetron (ZOFRAN ODT) 4 MG disintegrating tablet Take 1 tablet (4 mg total) by mouth every 8 (eight) hours as needed for nausea. 03/04/15   Rolland Porter, MD  oxyCODONE (OXY IR/ROXICODONE) 5 MG immediate release tablet Take 1-2 tablets (5-10 mg total) by mouth every 3 (three) hours as needed for severe pain or breakthrough pain. 07/20/13   Kerrin Champagne, MD  oxyCODONE (ROXICODONE) 15 MG immediate release tablet Take 1 tablet (15 mg total) by mouth every 6 (six) hours as needed for pain (Baseline pain control). 07/20/13   Kerrin Champagne, MD  promethazine (PHENERGAN) 25 MG tablet Take 25 mg by mouth 3 (three) times daily as needed for nausea or vomiting.    Historical Provider, MD  traZODone (DESYREL) 100 MG tablet Take 100 mg by mouth at bedtime.     Historical Provider, MD  venlafaxine XR (EFFEXOR-XR) 150 MG 24 hr capsule Take 150 mg by mouth daily.    Historical Provider, MD    Past Medical History  Diagnosis Date  . COPD (chronic obstructive pulmonary disease) (HCC)     denies SOB  . Sleep apnea     no CPAP; uses O2 at night 1l/min.  . Bone spur of other site     spine  . Insomnia     takes Trazodone nightly and Ambien  . Hypothyroidism     takes Synthroid daily  . PONV (postoperative nausea and vomiting)   . Pneumonia     hx of;last time about 6+yrs ago  . History of bronchitis     last time about 6+yrs ago  . Arthritis   . Joint pain   . Joint swelling   . Chronic back pain     spurs on spine  . GERD (gastroesophageal reflux disease)     takes Omeprazole daily  . History of colon polyps   . Urinary urgency   . History of kidney stones   . Nocturia   . Depression     takes Effexor daily  . Anxiety     Past Surgical History  Procedure Laterality Date  . Abdominal hysterectomy      complete  . Knee arthroscopy Right 07/16/2012    Procedure: ARTHROSCOPY KNEE;  Surgeon: Kerrin Champagne, MD;  Location: [facility];  Service: Orthopedics;  Laterality: Right;  Right knee arthroscopy with chondroplastic shaving of retropatella surface  . Colonoscopy    . Esophagogastroduodenoscopy    . Bladder tacked    . Knee arthroplasty Right 07/18/2013    Procedure: COMPUTER ASSISTED RIGHT TOTAL KNEE ARTHROPLASTY;  Surgeon: Kerrin Champagne, MD;  Location: [facility];  Service: Orthopedics;  Laterality: Right;    Family  history: family history includes Diabetes Mellitus II in her mother; Heart attack in her mother; Lymphoma in her father; Stroke in her mother.   Social History: Patient lives with her husband and grandson. She is an active smoker. She is independent with all ADLs. She is a former Interior and spatial designer.       Physical Exam: BP 140/57 mmHg  Pulse 66  Temp(Src) 98.2 F (36.8 C) (Oral)  Resp 20  Ht  (1.6 m)  Wt 53.751 kg (118 lb 8 oz)  BMI 21.00 kg/m2  SpO2 98% General appearance: Older adult female, alert and in mild distress from nausea.   Eyes: Anicteric, conjunctiva pink, lids and lashes normal.  ENT: No nasal deformity, discharge, or epistaxis.  OP moist without lesions.  Dentures. Skin: Warm and dry.   Cardiac: RRR, nl S1-S2, no murmurs appreciated.  Capillary refill is brisk.  No LE edema.  Radial pulses 2+ and symmetric. Respiratory: Normal respiratory rate and rhythm.  Diffuse low and high pitched inspiratory and expiratory wheezes. Abdomen: Abdomen soft without rigidity.  TTP in suprapubic.  No tenderness to palpation in epigastrium, just pain. No ascites, distension.   MSK: No deformities or effusions. Neuro: Sensorium intact and responding to questions, attention normal.  Speech is fluent.  Moves all extremities equally and with normal coordination.    Psych: Behavior appropriate.  Affect anxious.  No evidence of aural or visual hallucinations or delusions.       Labs on Admission:  The metabolic panel shows normal sodium, potassium, bicarbonate. The serum creatinine is 1.16 mg/dL from a baseline of 0.9 mg/dL. The transaminases and bilirubin are normal. Lipase is normal. The BNP is normal. Urinalysis shows leukocytes and skin bacteria. The initial troponin is 0.12 ng/mL The complete blood count shows slight leukocytosis, no anemia or from cytopenia.     Radiological Exams on Admission: Personally reviewed: Dg Chest 2 View 03/14/2015 No focal airspace disease.     EKG: Independently reviewed.  normal sinus rhythm with old left axis, stable from previous.   Assessment/Plan  1. Elevated troponin and epigastric pain with nausea: This is new.  Demand ischemia during UTI/dehydration is suspected more than anginal equivalent epigastric pain with troponin elevation from acute thrombosis.  The patient has HEART score of 4. Aspirin was given. -Serial troponins are ordered -Telemetry -Consult to cardiology, appreciate recommendations -Morphine for pain as needed  2. UTI:  The patient has a symptomatic UTI. Her previous culture was cipro resistant but Bactrim sensitive, so I am not sure why her second course of antibiotics was not effective. -Follow repeat urine culture -Continue ceftriaxone  3. Elevated creatinine:  The BUN to creatinine ratio is not elevated but the patient appears clinically dehydrated. -Rehydration -Repeat BMP  4.  COPD:  Clinically poorly compensated. -Continue Symbicort -Albuterol every 4 hours when necessary  5. Hypothyroidism:  Stable.  -Continue home levothyroxine  6.  Depression Stable. -Continue home Effexor 150 mg daily  7.  Chronic pain Stable.  -Continue home gabapentin      DVT PPx: Lovenox Diet: NPO after 4am, may advance diet if no stress testing Consultants: Cardiology Code Status: Full Family Communication:The patient's diagnoses were discussed with her husband and grandson at the bedside. All questions were answered. CODE STATUS was clarified.   Disposition Plan:  Observe for arrhythmia on telemetry, serial troponins and subsequent risk stratification by Cardiology.  If testing negative, home after. Follow urine culture and serum creatinine.     At the point of initial evaluation, it is my clinical opinion that admission for OBSERVATION is reasonable and necessary because the patient's presenting symptoms, physical exam findings, and initial radiographic and laboratory data in the context  of chronic comorbidities is felt to place him/her at high risk for further clinical deterioration but it is anticipated that the patient may be medically stable for discharge from the hospital within 24 to 48 hours.      Alberteen SamChristopher P Franchelle Foskett [facility] Pager [phone]

## 2015-03-15 NOTE — Consult Note (Signed)
CARDIOLOGY CONSULT NOTE   Patient ID: Patricia Sutton MRN: [id number] DOB/AGE: January 10, 1946 69 y.o.  Admit date: 03/14/2015  Primary Physician   PROVIDER NOT IN SYSTEM Primary Cardiologist  New ( Dr. Sanjuana Kava) Reason for Consultation   + troponin  HPI: Patricia Sutton is a 69 y.o. female with a history of COPD, continue tobacco abuse, depression, chronic pain, and hypothyroidism who was admitted to [facility] on 03/14/15 with vomiting and epigastric pain.  She initially developed dysuria, suprapubic pain and nausea vomiting 1 week ago. She presented to the ER at [facility] diagnosed with UTI and started on ciprofloxacin. She and her family report that over the next several days she took all doses of the medicine but she felt no improvement whatsoever. Her symptoms of nausea, vomiting, suprapubic pain, dysuria waxed and waned until she was reached by ED personnel and notified that her urine culture was resistant to ciprofloxacin and she was prescribed Bactrim which she completed. Unfortunately, this didn't help either and in the last 3 days the patient's nausea, vomiting, epigastric pain, and suprapubic pain asked to the point where she can't eat anything, feels weak and achy all over, and feels dizzy when she stands up.  In the ED, the patient's initial ECG normal sinus rhythm and troponin was elevated to 0.1 ng/mL. Her serum creatinine was slightly up from previous and she appeared clinically dehydrated. TRH was asked to admit for observation, rehydration, serial troponins and cardiovascular risk stratification.  She denies any chest pain. He has had some recent exertional shortness of breath. She denies LE edema, orthopnea or PND. No lightheadedness of dizziness.    Past Medical History  Diagnosis Date  . COPD (chronic obstructive pulmonary disease) (HCC)     denies SOB  . Sleep apnea     no CPAP; uses O2 at night 1l/min.  . Bone spur of other site     spine  . Insomnia     takes  Trazodone nightly and Ambien  . Hypothyroidism     takes Synthroid daily  . PONV (postoperative nausea and vomiting)   . Pneumonia     hx of;last time about 6+yrs ago  . History of bronchitis     last time about 6+yrs ago  . Arthritis   . Joint pain   . Joint swelling   . Chronic back pain     spurs on spine  . GERD (gastroesophageal reflux disease)     takes Omeprazole daily  . History of colon polyps   . Urinary urgency   . History of kidney stones   . Nocturia   . Depression     takes Effexor daily  . Anxiety      Past Surgical History  Procedure Laterality Date  . Abdominal hysterectomy      complete  . Knee arthroscopy Right 07/16/2012    Procedure: ARTHROSCOPY KNEE;  Surgeon: Kerrin Champagne, MD;  Location: [facility];  Service: Orthopedics;  Laterality: Right;  Right knee arthroscopy with chondroplastic shaving of retropatella surface  . Colonoscopy    . Esophagogastroduodenoscopy    . Bladder tacked    . Knee arthroplasty Right 07/18/2013    Procedure: COMPUTER ASSISTED RIGHT TOTAL KNEE ARTHROPLASTY;  Surgeon: Kerrin Champagne, MD;  Location: [facility];  Service: Orthopedics;  Laterality: Right;    No Known Allergies  I have reviewed the patient's current medications . budesonide-formoterol  2 puff Inhalation BID  . cefTRIAXone (ROCEPHIN)  IV  1 g Intravenous Q24H  . cefTRIAXone      . enoxaparin (LOVENOX) injection  30 mg Subcutaneous Q24H  . gabapentin  600 mg Oral BID  . levothyroxine  25 mcg Oral QAC breakfast  . pantoprazole  40 mg Oral Daily  . venlafaxine XR  150 mg Oral Daily   . sodium chloride 100 mL/hr at 03/15/15 0340   acetaminophen, albuterol, clonazePAM, gi cocktail, morphine injection, ondansetron (ZOFRAN) IV, ondansetron, oxyCODONE, promethazine **OR** promethazine  Prior to Admission medications   Medication Sig Start Date End Date Taking? Authorizing Provider  albuterol (PROVENTIL) (2.5 MG/3ML) 0.083% nebulizer solution Take 2.5 mg  by nebulization every 4 (four) hours as needed for wheezing or shortness of breath.   Yes Historical Provider, MD  aspirin EC 325 MG EC tablet Take 1 tablet (325 mg total) by mouth 2 (two) times daily after a meal. 07/20/13  Yes Kerrin ChampagneJames E Nitka, MD  budesonide-formoterol [facility](SYMBICORT) 160-4.5 MCG/ACT inhaler Inhale 2 puffs into the lungs 2 (two) times daily.   Yes Historical Provider, MD  diclofenac sodium (VOLTAREN) 1 % GEL Apply 2-5 g topically 3 (three) times daily as needed (pain).   Yes Historical Provider, MD  dicyclomine (BENTYL) 20 MG tablet Take 1 tablet (20 mg total) by mouth 2 (two) times daily. 03/04/15  Yes Rolland PorterMark James, MD  diphenoxylate-atropine (LOMOTIL) 2.5-0.025 MG tablet Take 1 tablet by mouth 4 (four) times daily as needed for diarrhea or loose stools. 03/04/15  Yes Rolland PorterMark James, MD  gabapentin (NEURONTIN) 300 MG capsule Take 600 mg by mouth 2 (two) times daily. At noon and at bedtime   Yes Historical Provider, MD  levothyroxine (SYNTHROID, LEVOTHROID) 25 MCG tablet Take 25 mcg by mouth daily before breakfast.   Yes Historical Provider, MD  lidocaine (XYLOCAINE) 2 % jelly Apply 1 application topically 2 (two) times daily as needed (pain). Apply 1/4 inch to knee as needed   Yes Historical Provider, MD  lubiprostone (AMITIZA) 24 MCG capsule Take 24 mcg by mouth 2 (two) times daily.   Yes Historical Provider, MD  omeprazole (PRILOSEC) 20 MG capsule Take 20 mg by mouth 2 (two) times daily.   Yes Historical Provider, MD  ondansetron (ZOFRAN ODT) 4 MG disintegrating tablet Take 1 tablet (4 mg total) by mouth every 8 (eight) hours as needed for nausea. 03/04/15  Yes Rolland PorterMark James, MD  oxyCODONE (ROXICODONE) 15 MG immediate release tablet Take 1 tablet (15 mg total) by mouth every 6 (six) hours as needed for pain (Baseline pain control). 07/20/13  Yes Kerrin ChampagneJames E Nitka, MD  oxyCODONE-acetaminophen (PERCOCET) 10-325 MG tablet Take 1 tablet by mouth every 8 (eight) hours as needed. pain 03/10/15 04/09/15 Yes  Historical Provider, MD  promethazine (PHENERGAN) 25 MG tablet Take 25 mg by mouth 3 (three) times daily as needed for nausea or vomiting.   Yes Historical Provider, MD  traZODone (DESYREL) 100 MG tablet Take 100 mg by mouth at bedtime.    Yes Historical Provider, MD  venlafaxine XR (EFFEXOR-XR) 150 MG 24 hr capsule Take 150 mg by mouth daily.   Yes Historical Provider, MD     Social History   Social History  . Marital Status: Legally Separated    Spouse Name: N/A  . Number of Children: N/A  . Years of Education: N/A   Occupational History  . Not on file.   Social History Main Topics  . Smoking status: Current Every Day Smoker -- 1.00 packs/day for 40 years  Types: Cigarettes  . Smokeless tobacco: Never Used     Comment: nothing since 07/11/13  . Alcohol Use: No  . Drug Use: No  . Sexual Activity: Not on file   Other Topics Concern  . Not on file   Social History Narrative    No family status information on file.   Family History  Problem Relation Age of Onset  . Heart attack Mother   . Stroke Mother   . Diabetes Mellitus II Mother   . Lymphoma Father      ROS:  Full 14 point review of systems complete and found to be negative unless listed above.  Physical Exam: Blood pressure 145/56, pulse 64, temperature 98.7 F (37.1 C), temperature source Oral, resp. rate 18, height  (1.6 m), weight 118 lb 8 oz (53.751 kg), SpO2 96 %.  General: Well developed, well nourished, female in no acute distress. Appears older than her stated age.  Head: Eyes PERRLA, No xanthomas.   Normocephalic and atraumatic, oropharynx without edema or exudate.  Lungs:  Diffuses wheezes and rales Heart: HRRR S1 S2, no rub/gallop, Heart irregular rate and rhythm with S1, S2  murmur. pulses are 2+ extrem.   Neck: No carotid bruits. No lymphadenopathy. No JVD. Abdomen: Bowel sounds present, abdomen soft and non-tender without masses or hernias noted. Msk:  No spine or cva tenderness. No  weakness, no joint deformities or effusions. Extremities: No clubbing or cyanosis.  No edema.  Neuro: Alert and oriented X 3. No focal deficits noted. Psych:  Good affect, responds appropriately Skin: No rashes or lesions noted.  Labs:  Lab Results  Component Value Date   WBC 8.1 03/15/2015   HGB 11.1* 03/15/2015   HCT 34.8* 03/15/2015   MCV 91.8 03/15/2015   PLT 261 03/15/2015   No results for input(s): INR in the last 72 hours.  Recent Labs Lab 03/14/15 1800  03/15/15 0610  NA 135  --  139  K 3.9  --  4.0  CL 102  --  103  CO2 25  --  29  BUN 12  --  7  CREATININE 1.15*  < > 0.97  CALCIUM 8.9  --  8.3*  PROT 7.4  --   --   BILITOT 0.2*  --   --   ALKPHOS 84  --   --   ALT 16  --   --   AST 26  --   --   GLUCOSE 150*  --  93  ALBUMIN 3.8  --   --   < > = values in this interval not displayed. No results found for: MG  Recent Labs  03/14/15 1800 03/15/15 0330 03/15/15 0610 03/15/15 0904  TROPONINI 0.12* 0.10* 0.10* 0.09*    LIPASE  Date/Time Value Ref Range Status  03/14/2015 06:00 PM 28 22 - 51 U/L Final    Echo:   ECG:  HR 60  Normal sinus rhythm Left axis deviation Septal infarct , age undetermined   Radiology:  Dg Chest 2 View  03/14/2015  CLINICAL DATA:  Cough EXAM: CHEST  2 VIEW COMPARISON:  03/04/2015 FINDINGS: Normal heart size and mediastinal contours. Stable and symmetric biapical pleural thickening. Chronic central bronchial wall thickening, likely related to patient's history of smoking. No acute infiltrate or edema. No effusion or pneumothorax. No acute osseous findings. IMPRESSION: No evidence of acute cardiopulmonary disease. Chronic central bronchitic markings. Electronically Signed   By: Marnee Spring M.D.   On: 03/14/2015 20:55  ASSESSMENT AND PLAN:    Principal Problem:   Elevated troponin Active Problems:   Tobacco use disorder   UTI (lower urinary tract infection)   COPD (chronic obstructive pulmonary disease) (HCC)    Hypothyroidism   Chronic pain syndrome   Dehydration  Patricia Sutton is a 69 y.o. female with a history of COPD, continue tobacco abuse, depression, chronic pain, and hypothyroidism who was admitted to [facility] on 03/14/15 with vomiting and epigastric pain. Trop noted to be elevated and cardiology consulted  Elevated troponin- mildly elevated and flat. ECG with septal Q waves but no objective signs of ischemia. She has had no chest pain but mild DOE. Will proceed with 2D ECHO for further risk stratification.   Tobacco abuse- needs to quit smoking.   UTI - per IM  Signed: Janetta Hora, PA-C 03/15/2015 10:26 AM Pager [phone] Patient seen and examined. I agree with the assessment and plan as detailed above. See also my additional thoughts below.   The troponins are flat. She may have mild chronic troponin elevation. Plan 2D echo. If LV normal, no further workup.   Willa Rough, MD, [facility] 03/15/2015 12:03 PM

## 2015-03-15 NOTE — ED Notes (Signed)
Pt caught smoking in b/r, issue addressed, lighter confiscated. Security and PD in to speak with pt.

## 2015-03-15 NOTE — Progress Notes (Signed)
Pt requesting medicine to help her rest. Pt states she takes Klonopin at home, which was started in July after her sister's death. [facility] Benedetto Coons. Callahan NP notified and new order for one time dose of ativan.

## 2015-03-15 NOTE — ED Notes (Signed)
Tolerating snacks, [facility] here, transfer held, bed no longer available, pending confirmation.

## 2015-03-15 NOTE — Progress Notes (Signed)
[facility] at 00:40,awaiting [facility] [phone]

## 2015-03-16 ENCOUNTER — Inpatient Hospital Stay (HOSPITAL_COMMUNITY): Payer: Medicare Other

## 2015-03-16 DIAGNOSIS — E860 Dehydration: Principal | ICD-10-CM

## 2015-03-16 DIAGNOSIS — R079 Chest pain, unspecified: Secondary | ICD-10-CM

## 2015-03-16 DIAGNOSIS — N390 Urinary tract infection, site not specified: Secondary | ICD-10-CM

## 2015-03-16 LAB — URINE CULTURE

## 2015-03-16 LAB — MAGNESIUM: MAGNESIUM: 2.1 mg/dL (ref 1.7–2.4)

## 2015-03-16 LAB — BASIC METABOLIC PANEL
Anion gap: 7 (ref 5–15)
BUN: 7 mg/dL (ref 6–20)
CHLORIDE: 105 mmol/L (ref 101–111)
CO2: 29 mmol/L (ref 22–32)
CREATININE: 0.78 mg/dL (ref 0.44–1.00)
Calcium: 9 mg/dL (ref 8.9–10.3)
Glucose, Bld: 111 mg/dL — ABNORMAL HIGH (ref 65–99)
Potassium: 4.1 mmol/L (ref 3.5–5.1)
SODIUM: 141 mmol/L (ref 135–145)

## 2015-03-16 LAB — TROPONIN I: TROPONIN I: 0.05 ng/mL — AB (ref <0.031)

## 2015-03-16 MED ORDER — SULFAMETHOXAZOLE-TRIMETHOPRIM 800-160 MG PO TABS
1.0000 | ORAL_TABLET | Freq: Two times a day (BID) | ORAL | Status: DC
  Administered 2015-03-16: 1 via ORAL
  Filled 2015-03-16 (×2): qty 1

## 2015-03-16 MED ORDER — ASPIRIN EC 81 MG PO TBEC: 81.0000 mg | DELAYED_RELEASE_TABLET | Freq: Every day | ORAL | Status: AC

## 2015-03-16 MED ORDER — ENOXAPARIN SODIUM 40 MG/0.4ML ~~LOC~~ SOLN
40.0000 mg | SUBCUTANEOUS | Status: DC
  Administered 2015-03-16: 40 mg via SUBCUTANEOUS
  Filled 2015-03-16: qty 0.4

## 2015-03-16 MED ORDER — LORAZEPAM 0.5 MG PO TABS
0.5000 mg | ORAL_TABLET | Freq: Once | ORAL | Status: AC
  Administered 2015-03-16: 0.5 mg via ORAL
  Filled 2015-03-16: qty 1

## 2015-03-16 MED ORDER — SENNA 8.6 MG PO TABS: 1.0000 | ORAL_TABLET | Freq: Every day | ORAL | Status: DC

## 2015-03-16 MED ORDER — DOCUSATE SODIUM 100 MG PO CAPS: 100.0000 mg | ORAL_CAPSULE | Freq: Two times a day (BID) | ORAL | Status: DC

## 2015-03-16 MED ORDER — SULFAMETHOXAZOLE-TRIMETHOPRIM 800-160 MG PO TABS: 1.0000 | ORAL_TABLET | Freq: Two times a day (BID) | ORAL | Status: DC

## 2015-03-16 NOTE — Progress Notes (Signed)
Patient Name: Patricia Sutton Date of Encounter: 03/16/2015     Principal Problem:   Elevated troponin Active Problems:   Tobacco use disorder   UTI (lower urinary tract infection)   COPD (chronic obstructive pulmonary disease) (HCC)   Hypothyroidism   Chronic pain syndrome   Dehydration    SUBJECTIVE  She had some atypical chest pain overnight which was the first time ever. It had no radiation, SOB, nausea or diaphoresis.   CURRENT MEDS . budesonide-formoterol  2 puff Inhalation BID  . cefTRIAXone (ROCEPHIN)  IV  1 g Intravenous Q24H  . enoxaparin (LOVENOX) injection  40 mg Subcutaneous Q24H  . gabapentin  600 mg Oral BID  . levothyroxine  25 mcg Oral QAC breakfast  . pantoprazole  40 mg Oral Daily  . venlafaxine XR  150 mg Oral Daily    OBJECTIVE  Filed Vitals:   03/16/15 0410 03/16/15 0552 03/16/15 0554 03/16/15 0939  BP: 154/61  158/69 137/52  Pulse: 63  63 72  Temp:   98.1 F (36.7 C) 98.3 F (36.8 C)  TempSrc:   Oral Oral  Resp: 16  18 18   Height:      Weight:  120 lb 5.4 oz (54.586 kg)    SpO2: 98%  96% 97%    Intake/Output Summary (Last 24 hours) at 03/16/15 1047 Last data filed at 03/16/15 0559  Gross per 24 hour  Intake    720 ml  Output   3351 ml  Net  -2631 ml   Filed Weights   03/14/15 1947 03/15/15 0218 03/16/15 0552  Weight: 110 lb (49.896 kg) 118 lb 8 oz (53.751 kg) 120 lb 5.4 oz (54.586 kg)    PHYSICAL EXAM  General: Well developed, well nourished, female in no acute distress. Appears older than her stated age.  Head: Eyes PERRLA, No xanthomas. Normocephalic and atraumatic, oropharynx without edema or exudate.  Lungs: Diffuses wheezes and rales Heart: HRRR S1 S2, no rub/gallop, Heart irregular rate and rhythm with S1, S2 murmur. pulses are 2+ extrem.  Neck: No carotid bruits. No lymphadenopathy. No JVD. Abdomen: Bowel sounds present, abdomen soft and non-tender without masses or hernias noted. Msk: No spine or cva  tenderness. No weakness, no joint deformities or effusions. Extremities: No clubbing or cyanosis. No edema.  Neuro: Alert and oriented X 3. No focal deficits noted. Psych: Good affect, responds appropriately Skin: No rashes or lesions noted.  Accessory Clinical Findings  CBC  Recent Labs  03/14/15 1800 03/15/15 0610  WBC 10.8* 8.1  NEUTROABS 5.4  --   HGB 12.9 11.1*  HCT 39.5 34.8*  MCV 90.8 91.8  PLT 309 261   Basic Metabolic Panel  Recent Labs  03/15/15 0610 03/16/15 0212  NA 139 141  K 4.0 4.1  CL 103 105  CO2 29 29  GLUCOSE 93 111*  BUN 7 7  CREATININE 0.97 0.78  CALCIUM 8.3* 9.0  MG  --  2.1   Liver Function Tests  Recent Labs  03/14/15 1800  AST 26  ALT 16  ALKPHOS 84  BILITOT 0.2*  PROT 7.4  ALBUMIN 3.8    Recent Labs  03/14/15 1800  LIPASE 28   Cardiac Enzymes  Recent Labs  03/15/15 0330 03/15/15 0610 03/15/15 0904  TROPONINI 0.10* 0.10* 0.09*   TELE: sinus brady  Radiology/Studies  Dg Chest 2 View  03/14/2015  CLINICAL DATA:  Cough EXAM: CHEST  2 VIEW COMPARISON:  03/04/2015 FINDINGS: Normal heart size and mediastinal  contours. Stable and symmetric biapical pleural thickening. Chronic central bronchial wall thickening, likely related to patient's history of smoking. No acute infiltrate or edema. No effusion or pneumothorax. No acute osseous findings. IMPRESSION: No evidence of acute cardiopulmonary disease. Chronic central bronchitic markings. Electronically Signed   By: Marnee Spring M.D.   On: 03/14/2015 20:55   Ct Abdomen Pelvis W Contrast  03/15/2015  CLINICAL DATA:  Abdominal pain, nausea, vomiting and back pain for week. Weakness. EXAM: CT ABDOMEN AND PELVIS WITH CONTRAST TECHNIQUE: Multidetector CT imaging of the abdomen and pelvis was performed using the standard protocol following bolus administration of intravenous contrast. CONTRAST:  OMNIPAQUE IOHEXOL 300 MG/ML  SOLN COMPARISON:  08/31/2014. FINDINGS: Lower  chest: Lung bases show minimal dependent subpleural atelectasis. Heart size normal. No pericardial or pleural effusion. Hepatobiliary: Liver and gallbladder are unremarkable. No biliary ductal dilatation. Pancreas: Negative. Spleen: Negative. Adrenals/Urinary Tract: Adrenal glands and kidneys are unremarkable. Ureters are decompressed. Bladder is unremarkable. Stomach/Bowel: Stomach, small bowel, appendix and colon are unremarkable with the exception of stool throughout the descending and rectosigmoid colon. Vascular/Lymphatic: Atherosclerotic calcification of the arterial vasculature without abdominal aortic aneurysm. Scattered lymph nodes are not enlarged by CT size criteria. Reproductive: Uterus appears to be absent. Ovaries are not well-visualized. Other: Small pelvic free fluid. Mesenteries and peritoneum are unremarkable. Subcutaneous air and stranding along the ventral left pelvic wall are presumably iatrogenic. Musculoskeletal: No worrisome lytic or sclerotic lesions. Degenerative changes are seen in the spine. IMPRESSION: 1. No acute findings to explain the patient's symptoms. 2. Stool throughout the descending and rectosigmoid colon suggests constipation. 3. Small pelvic free fluid. Electronically Signed   By: Leanna Battles M.D.   On: 03/15/2015 14:31   Dg Abd Acute W/chest  03/04/2015  CLINICAL DATA:  Abdominal pain and nausea for 2 days. EXAM: DG ABDOMEN ACUTE W/ 1V CHEST COMPARISON:  None. FINDINGS: There is no evidence of dilated bowel loops or free intraperitoneal air. No radiopaque calculi or other significant radiographic abnormality is seen. Heart size and mediastinal contours are within normal limits. Both lungs are clear, with moderate hyperinflation and probable upper lobe emphysematous changes. IMPRESSION: Negative abdominal radiographs.  No acute cardiopulmonary disease. Electronically Signed   By: Ellery Plunk M.D.   On: 03/04/2015 21:28    ASSESSMENT AND PLAN  Patricia Sutton  is a 69 y.o. female with a history of COPD, continue tobacco abuse, depression, chronic pain, and hypothyroidism who was admitted to [facility] on 03/14/15 with vomiting and epigastric pain. Trop noted to be elevated and cardiology consulted  Elevated troponin- mildly elevated and flat. ECG with septal Q waves but no objective signs of ischemia. She has had no chest pain but mild DOE. She may have mild chronic troponin elevation. Plan 2D echo. If LV normal, no further workup. This is being done now. Interestingly, she had some atypical chest pain overnight which was the first time ever. We will add another troponin today with recent episode of chest pain  Tobacco abuse- needs to quit smoking.   UTI - per IM  Signed, Janetta Hora PA-C  Pager [phone] Patient seen and examined. I agree with the assessment and plan as detailed above. See also my additional thoughts below.   Troponin will be repeated again to see if it is chronically elevated. 2-D echo will be reviewed after his been completed.  Willa Rough, MD, [facility] 03/17/2015 7:29 AM

## 2015-03-16 NOTE — Progress Notes (Signed)
Pt. C/o back and chest pain 5/10. PRN pain medication administered. EKG obtained, VSS. Text page sent to On call NP, K. Craige CottaKirby.

## 2015-03-16 NOTE — Discharge Summary (Addendum)
Physician Discharge Summary  Patricia Sutton ZOX:[id number] DOB: [birth year] DOA: 03/14/2015  PCP: PROVIDER NOT IN SYSTEM  Admit date: 03/14/2015 Discharge date: 03/16/2015  Recommendations for Outpatient Follow-up:  1. Pt will need to follow up with PCP in 2 weeks post discharge 2. Please obtain BMP in one week  Discharge Diagnoses:  Dehydration -Secondary to GI losses -Continue intravenous fluids -Part of the patient's elevated creatinine is due to her recent use of trimethoprim sulfamethoxazole which inhibits tubular secretion of creatinine Epigastric and suprapubic pain with associated vomiting -Obtain CT abdomen and pelvis--negative for bowel wall thickening, no renal or bladder abnormalities, significant for constipation -03/14/2015 urinalysis without significant pyuria-partly attributable due to patient on recent antibiotics -Suspect a degree of gastritis and gastroparesis given the patient's chronic opioid use as well as constipation -[facility] pain management [facility]  -I have explained to the patient that she will not be discharged with any prescriptions for opioids and imodium use -Discontinue ceftriaxone -Discharge home with trimethoprim sulfamethoxazole for 6 more days which will complete 7 days of therapy -Urine culture previously showed Escherichia coli sensitive to trimethoprim sulfamethoxazole -Urine culture on this admission was negative as patient has been on recent antibiotics  Elevated troponin -Demand ischemia in the setting of dehydration -EKG without any concerning ischemic changes -No angina symptoms -Patient was seen by cardiology who did not recommend any further workup as the patient's echocardiogram was essentially unremarkable  -03/16/2015--echo shows EF 65-70%, no wall motion normality  COPD -Presently compensated, stable on room air -Unfortunately, the patient continues to smoke one half pack per day -Tobacco  cessation discussed -Continue LABA Hypothyroidism -Continue Synthroid Chronic pain syndrome -Restart the patient's home opioid regimen  Discharge Condition: stable  Disposition: home  Diet:heart healthy Wt Readings from Last 3 Encounters:  03/16/15 54.586 kg (120 lb 5.4 oz)  03/04/15 49.896 kg (110 lb)  08/30/14 47.628 kg (105 lb)    History of present illness:  69 year old female with a history of COPD, continued tobacco use, chronic pain syndrome, depression, hypothyroidism presented with intermittent one-week history of nausea and vomiting. Approximately 8 days prior to this admission the patient had approximately 3 days of vomiting with associated epigastric and suprapubic pain and dysuria. The patient went to the emergency department and was discharged home with Cipro. When final susceptibilities of her urine culture returned, the patient was changed to trimethoprim sulfamethoxazole which she finished 3 days of therapy one day prior to this admission. She took her last dose on 03/12/2015. Unfortunately, the patient began having nausea and vomiting with associated suprapubic and epigastric pain on 03/13/2015 with associated dizziness. which prompted her to come to the emergency department. The patient was started on intravenous ceftriaxone. CT of the abdomen and pelvis was obtained and showed constipation without any other acute abnormalities. Echocardiogram was obtained and did not show any wall motion on the mild disease with EF 65-70%. Cardiology was consulted due to elevated troponin. They did not recommend any further workup as to patient's echocardiogram was unremarkable.   Discharge Exam: Filed Vitals:   03/16/15 1513  BP: 147/67  Pulse: 64  Temp: 97.8 F (36.6 C)  Resp: 18   Filed Vitals:   03/16/15 0552 03/16/15 0554 03/16/15 0939 03/16/15 1513  BP:  158/69 137/52 147/67  Pulse:  63 72 64  Temp:  98.1 F (36.7 C) 98.3 F (36.8 C) 97.8 F (36.6 C)  TempSrc:  Oral  Oral Oral  Resp:  18 18 18  Height:      Weight: 54.586 kg (120 lb 5.4 oz)     SpO2:  96% 97% 98%   General: A&O x 3, NAD, pleasant, cooperative Cardiovascular: RRR, no rub, no gallop, no S3 Respiratory: CTAB, no wheeze, no rhonchi Abdomen:soft, nontender, nondistended, positive bowel sounds Extremities: No edema, No lymphangitis, no petechiae  Discharge Instructions      Discharge Instructions    Diet - low sodium heart healthy    Complete by:  As directed      Increase activity slowly    Complete by:  As directed             Medication List    STOP taking these medications        diphenoxylate-atropine 2.5-0.025 MG tablet  Commonly known as:  LOMOTIL      TAKE these medications        albuterol (2.5 MG/3ML) 0.083% nebulizer solution  Commonly known as:  PROVENTIL  Take 2.5 mg by nebulization every 4 (four) hours as needed for wheezing or shortness of breath.     AMITIZA 24 MCG capsule  Generic drug:  lubiprostone  Take 24 mcg by mouth 2 (two) times daily.     aspirin EC 81 MG tablet  Take 1 tablet (81 mg total) by mouth daily.     budesonide-formoterol 160-4.5 MCG/ACT inhaler  Commonly known as:  SYMBICORT  Inhale 2 puffs into the lungs 2 (two) times daily.     dicyclomine 20 MG tablet  Commonly known as:  BENTYL  Take 1 tablet (20 mg total) by mouth 2 (two) times daily.     docusate sodium 100 MG capsule  Commonly known as:  COLACE  Take 1 capsule (100 mg total) by mouth 2 (two) times daily.     gabapentin 300 MG capsule  Commonly known as:  NEURONTIN  Take 600 mg by mouth 2 (two) times daily. At noon and at bedtime     levothyroxine 25 MCG tablet  Commonly known as:  SYNTHROID, LEVOTHROID  Take 25 mcg by mouth daily before breakfast.     lidocaine 2 % jelly  Commonly known as:  XYLOCAINE  Apply 1 application topically 2 (two) times daily as needed (pain). Apply 1/4 inch to knee as needed     omeprazole 20 MG capsule  Commonly known as:   PRILOSEC  Take 20 mg by mouth 2 (two) times daily.     ondansetron 4 MG disintegrating tablet  Commonly known as:  ZOFRAN ODT  Take 1 tablet (4 mg total) by mouth every 8 (eight) hours as needed for nausea.     oxyCODONE 15 MG immediate release tablet  Commonly known as:  ROXICODONE  Take 1 tablet (15 mg total) by mouth every 6 (six) hours as needed for pain (Baseline pain control).     PERCOCET 10-325 MG tablet  Generic drug:  oxyCODONE-acetaminophen  Take 1 tablet by mouth every 8 (eight) hours as needed. pain     promethazine 25 MG tablet  Commonly known as:  PHENERGAN  Take 25 mg by mouth 3 (three) times daily as needed for nausea or vomiting.     senna 8.6 MG Tabs tablet  Commonly known as:  SENOKOT  Take 1 tablet (8.6 mg total) by mouth daily.     sulfamethoxazole-trimethoprim 800-160 MG tablet  Commonly known as:  BACTRIM DS,SEPTRA DS  Take 1 tablet by mouth every 12 (twelve) hours.     traZODone 100 MG  tablet  Commonly known as:  DESYREL  Take 100 mg by mouth at bedtime.     venlafaxine XR 150 MG 24 hr capsule  Commonly known as:  EFFEXOR-XR  Take 150 mg by mouth daily.     VOLTAREN 1 % Gel  Generic drug:  diclofenac sodium  Apply 2-5 g topically 3 (three) times daily as needed (pain).         The results of significant diagnostics from this hospitalization (including imaging, microbiology, ancillary and laboratory) are listed below for reference.    Significant Diagnostic Studies: Dg Chest 2 View  03/14/2015  CLINICAL DATA:  Cough EXAM: CHEST  2 VIEW COMPARISON:  03/04/2015 FINDINGS: Normal heart size and mediastinal contours. Stable and symmetric biapical pleural thickening. Chronic central bronchial wall thickening, likely related to patient's history of smoking. No acute infiltrate or edema. No effusion or pneumothorax. No acute osseous findings. IMPRESSION: No evidence of acute cardiopulmonary disease. Chronic central bronchitic markings. Electronically  Signed   By: Marnee Spring M.D.   On: 03/14/2015 20:55   Ct Abdomen Pelvis W Contrast  03/15/2015  CLINICAL DATA:  Abdominal pain, nausea, vomiting and back pain for week. Weakness. EXAM: CT ABDOMEN AND PELVIS WITH CONTRAST TECHNIQUE: Multidetector CT imaging of the abdomen and pelvis was performed using the standard protocol following bolus administration of intravenous contrast. CONTRAST:  OMNIPAQUE IOHEXOL 300 MG/ML  SOLN COMPARISON:  08/31/2014. FINDINGS: Lower chest: Lung bases show minimal dependent subpleural atelectasis. Heart size normal. No pericardial or pleural effusion. Hepatobiliary: Liver and gallbladder are unremarkable. No biliary ductal dilatation. Pancreas: Negative. Spleen: Negative. Adrenals/Urinary Tract: Adrenal glands and kidneys are unremarkable. Ureters are decompressed. Bladder is unremarkable. Stomach/Bowel: Stomach, small bowel, appendix and colon are unremarkable with the exception of stool throughout the descending and rectosigmoid colon. Vascular/Lymphatic: Atherosclerotic calcification of the arterial vasculature without abdominal aortic aneurysm. Scattered lymph nodes are not enlarged by CT size criteria. Reproductive: Uterus appears to be absent. Ovaries are not well-visualized. Other: Small pelvic free fluid. Mesenteries and peritoneum are unremarkable. Subcutaneous air and stranding along the ventral left pelvic wall are presumably iatrogenic. Musculoskeletal: No worrisome lytic or sclerotic lesions. Degenerative changes are seen in the spine. IMPRESSION: 1. No acute findings to explain the patient's symptoms. 2. Stool throughout the descending and rectosigmoid colon suggests constipation. 3. Small pelvic free fluid. Electronically Signed   By: Leanna Battles M.D.   On: 03/15/2015 14:31   Dg Abd Acute W/chest  03/04/2015  CLINICAL DATA:  Abdominal pain and nausea for 2 days. EXAM: DG ABDOMEN ACUTE W/ 1V CHEST COMPARISON:  None. FINDINGS: There is no evidence of  dilated bowel loops or free intraperitoneal air. No radiopaque calculi or other significant radiographic abnormality is seen. Heart size and mediastinal contours are within normal limits. Both lungs are clear, with moderate hyperinflation and probable upper lobe emphysematous changes. IMPRESSION: Negative abdominal radiographs.  No acute cardiopulmonary disease. Electronically Signed   By: Ellery Plunk M.D.   On: 03/04/2015 21:28     Microbiology: Recent Results (from the past 240 hour(s))  Urine culture     Status: None (Preliminary result)   Collection Time: 03/14/15  8:26 PM  Result Value Ref Range Status   Specimen Description URINE, CLEAN CATCH  Final   Special Requests NONE  Final   Culture   Final    TOO YOUNG TO READ Performed at [facility]    Report Status PENDING  Incomplete     Labs: Basic  Metabolic Panel:  Recent Labs Lab 03/14/15 1800 03/15/15 0330 03/15/15 0610 03/16/15 0212  NA 135  --  139 141  K 3.9  --  4.0 4.1  CL 102  --  103 105  CO2 25  --  29 29  GLUCOSE 150*  --  93 111*  BUN 12  --  7 7  CREATININE 1.15* 0.94 0.97 0.78  CALCIUM 8.9  --  8.3* 9.0  MG  --   --   --  2.1   Liver Function Tests:  Recent Labs Lab 03/14/15 1800  AST 26  ALT 16  ALKPHOS 84  BILITOT 0.2*  PROT 7.4  ALBUMIN 3.8    Recent Labs Lab 03/14/15 1800  LIPASE 28   No results for input(s): AMMONIA in the last 168 hours. CBC:  Recent Labs Lab 03/14/15 1800 03/15/15 0610  WBC 10.8* 8.1  NEUTROABS 5.4  --   HGB 12.9 11.1*  HCT 39.5 34.8*  MCV 90.8 91.8  PLT 309 261   Cardiac Enzymes:  Recent Labs Lab 03/14/15 1800 03/15/15 0330 03/15/15 0610 03/15/15 0904 03/16/15 1136  TROPONINI 0.12* 0.10* 0.10* 0.09* 0.05*   BNP: Invalid input(s): POCBNP CBG: No results for input(s): GLUCAP in the last 168 hours.  Time coordinating discharge:  Greater than 30 minutes  Signed:  Maleni Seyer, DO [facility] Pager: [phone] 03/16/2015,  3:43 PM

## 2015-03-16 NOTE — Progress Notes (Signed)
Orders received for pt discharge.  Discharge summary printed and reviewed with pt.  Explained medication regimen, and pt had no further questions at this time.  IV removed and site remains clean, dry, intact.  Telemetry removed.  Pt in stable condition and awaiting transport. 

## 2015-03-16 NOTE — Progress Notes (Signed)
Echocardiogram 2D Echocardiogram has been performed.  Patricia Sutton, Patricia Sutton 03/16/2015, 11:11 AM

## 2015-03-16 NOTE — Progress Notes (Signed)
Patient is independent of ADL's, continues to drive and is active in the community. PCP [facility]: Micael HampshireBrown Kathy MD - Address: [address], [place], [place] [address]  [phone] Has private insurance with Medicare and Medicaid with prescription drug coverage. Pharmacy of choice is [facility] - she stated that she does not have any problem getting her medication. No needs identified; Alexis GoodellB Osualdo Hansell RN,MHA,BSN [phone]

## 2015-08-23 ENCOUNTER — Encounter (HOSPITAL_BASED_OUTPATIENT_CLINIC_OR_DEPARTMENT_OTHER): Payer: Self-pay | Admitting: *Deleted

## 2015-08-23 ENCOUNTER — Emergency Department (HOSPITAL_BASED_OUTPATIENT_CLINIC_OR_DEPARTMENT_OTHER)
Admission: EM | Admit: 2015-08-23 | Discharge: 2015-08-23 | Disposition: A | Discharge status: Home / Self Care | Payer: Medicare Other | Attending: Emergency Medicine | Admitting: Emergency Medicine

## 2015-08-23 ENCOUNTER — Emergency Department (HOSPITAL_BASED_OUTPATIENT_CLINIC_OR_DEPARTMENT_OTHER): Payer: Medicare Other

## 2015-08-23 DIAGNOSIS — Z79899 Other long term (current) drug therapy: Secondary | ICD-10-CM | POA: Insufficient documentation

## 2015-08-23 DIAGNOSIS — G8929 Other chronic pain: Secondary | ICD-10-CM | POA: Diagnosis not present

## 2015-08-23 DIAGNOSIS — F419 Anxiety disorder, unspecified: Secondary | ICD-10-CM | POA: Diagnosis not present

## 2015-08-23 DIAGNOSIS — R8299 Other abnormal findings in urine: Secondary | ICD-10-CM | POA: Insufficient documentation

## 2015-08-23 DIAGNOSIS — R109 Unspecified abdominal pain: Secondary | ICD-10-CM | POA: Insufficient documentation

## 2015-08-23 DIAGNOSIS — K219 Gastro-esophageal reflux disease without esophagitis: Secondary | ICD-10-CM | POA: Diagnosis not present

## 2015-08-23 DIAGNOSIS — F329 Major depressive disorder, single episode, unspecified: Secondary | ICD-10-CM | POA: Insufficient documentation

## 2015-08-23 DIAGNOSIS — Z8701 Personal history of pneumonia (recurrent): Secondary | ICD-10-CM | POA: Insufficient documentation

## 2015-08-23 DIAGNOSIS — R110 Nausea: Secondary | ICD-10-CM | POA: Diagnosis not present

## 2015-08-23 DIAGNOSIS — F17210 Nicotine dependence, cigarettes, uncomplicated: Secondary | ICD-10-CM | POA: Insufficient documentation

## 2015-08-23 DIAGNOSIS — E039 Hypothyroidism, unspecified: Secondary | ICD-10-CM | POA: Diagnosis not present

## 2015-08-23 DIAGNOSIS — Z87448 Personal history of other diseases of urinary system: Secondary | ICD-10-CM | POA: Diagnosis not present

## 2015-08-23 DIAGNOSIS — Z86010 Personal history of colonic polyps: Secondary | ICD-10-CM | POA: Diagnosis not present

## 2015-08-23 DIAGNOSIS — R350 Frequency of micturition: Secondary | ICD-10-CM | POA: Diagnosis not present

## 2015-08-23 DIAGNOSIS — G4700 Insomnia, unspecified: Secondary | ICD-10-CM | POA: Diagnosis not present

## 2015-08-23 DIAGNOSIS — M549 Dorsalgia, unspecified: Secondary | ICD-10-CM | POA: Diagnosis present

## 2015-08-23 DIAGNOSIS — R300 Dysuria: Secondary | ICD-10-CM | POA: Insufficient documentation

## 2015-08-23 DIAGNOSIS — J449 Chronic obstructive pulmonary disease, unspecified: Secondary | ICD-10-CM | POA: Diagnosis not present

## 2015-08-23 DIAGNOSIS — Z7982 Long term (current) use of aspirin: Secondary | ICD-10-CM | POA: Insufficient documentation

## 2015-08-23 DIAGNOSIS — Z7951 Long term (current) use of inhaled steroids: Secondary | ICD-10-CM | POA: Diagnosis not present

## 2015-08-23 DIAGNOSIS — Z87442 Personal history of urinary calculi: Secondary | ICD-10-CM | POA: Diagnosis not present

## 2015-08-23 DIAGNOSIS — G4730 Sleep apnea, unspecified: Secondary | ICD-10-CM | POA: Insufficient documentation

## 2015-08-23 DIAGNOSIS — R82998 Other abnormal findings in urine: Secondary | ICD-10-CM

## 2015-08-23 DIAGNOSIS — M1990 Unspecified osteoarthritis, unspecified site: Secondary | ICD-10-CM | POA: Diagnosis not present

## 2015-08-23 LAB — URINALYSIS, ROUTINE W REFLEX MICROSCOPIC
Bilirubin Urine: NEGATIVE
GLUCOSE, UA: NEGATIVE mg/dL
Ketones, ur: NEGATIVE mg/dL
Nitrite: NEGATIVE
PH: 5.5 (ref 5.0–8.0)
Protein, ur: NEGATIVE mg/dL
Specific Gravity, Urine: 1.014 (ref 1.005–1.030)

## 2015-08-23 LAB — URINE MICROSCOPIC-ADD ON

## 2015-08-23 MED ORDER — MORPHINE SULFATE (PF) 2 MG/ML IV SOLN
2.0000 mg | Freq: Once | INTRAVENOUS | Status: DC
Start: 2015-08-23 — End: 2015-08-23

## 2015-08-23 MED ORDER — MORPHINE SULFATE (PF) 4 MG/ML IV SOLN
4.0000 mg | Freq: Once | INTRAVENOUS | Status: AC
Start: 2015-08-23 — End: 2015-08-23
  Administered 2015-08-23: 4 mg via INTRAMUSCULAR
  Filled 2015-08-23: qty 1

## 2015-08-23 MED ORDER — CEPHALEXIN 500 MG PO CAPS: 500.0000 mg | ORAL_CAPSULE | Freq: Four times a day (QID) | ORAL | Status: DC

## 2015-08-23 MED ORDER — ONDANSETRON 4 MG PO TBDP
4.0000 mg | ORAL_TABLET | Freq: Once | ORAL | Status: AC
  Administered 2015-08-23: 4 mg via ORAL
  Filled 2015-08-23: qty 1

## 2015-08-23 MED FILL — CEPHALEXIN 500 MG CAPSULE: 500 | 7 days supply | Qty: 28 | Fill #0

## 2015-08-23 NOTE — ED Notes (Signed)
Mid back pain. Urinary frequency and nausea.

## 2015-08-23 NOTE — ED Notes (Signed)
Patient transported to CT 

## 2015-08-23 NOTE — ED Notes (Signed)
Pt states she has someone coming to the ED to drive her home

## 2015-08-23 NOTE — Discharge Instructions (Signed)
Follow-up with your primary physician on Wednesday or Thursday of this week. Take antibiotics as directed until finished. Return to the ER for any new or worsening symptoms, any additional concerns.

## 2015-08-23 NOTE — ED Provider Notes (Signed)
CSN: [id number]     Arrival date & time 08/23/15  1108 History   First MD Initiated Contact with Patient 08/23/15 1220     Chief Complaint  Patient presents with  . Back Pain   (Consider location/radiation/quality/duration/timing/severity/associated sxs/prior Treatment) Patient is a 70 y.o. female presenting with back pain. The history is provided by the patient and medical records. No language interpreter was used.  Back Pain Associated symptoms: dysuria   Associated symptoms: no fever and no headaches     Patricia Sutton is a 70 y.o. female  with a PMH of COPD, hypothyroid, kidney stones, chronic pain who presents to the Emergency Department complaining of waxing-waning, sharp right-sided back pain radiating to right flank x 3 weeks. Associated symptoms include dysuria, urinary frequency, and nausea. Denies fever, change in bowel habits, chest pain, sob. Pt. Was seen at onset of symptoms and treated for UTI with bactrim (started ABX on 3/08) and states compliance with ABX regimen. Patient states hx of UTI's but this feels more severe and different quality of pain. Pt on oxycodone 10mg  TID for chronic back pain - last taken at 9am this morning.   Past Medical History  Diagnosis Date  . COPD (chronic obstructive pulmonary disease) (HCC)     denies SOB  . Sleep apnea     no CPAP; uses O2 at night 1l/min.  . Bone spur of other site     spine  . Insomnia     takes Trazodone nightly and Ambien  . Hypothyroidism     takes Synthroid daily  . PONV (postoperative nausea and vomiting)   . Pneumonia     hx of;last time about 6+yrs ago  . History of bronchitis     last time about 6+yrs ago  . Arthritis   . Joint pain   . Joint swelling   . Chronic back pain     spurs on spine  . GERD (gastroesophageal reflux disease)     takes Omeprazole daily  . History of colon polyps   . Urinary urgency   . History of kidney stones   . Nocturia   . Depression     takes Effexor daily  . Anxiety    . Asthma   . Renal insufficiency    Past Surgical History  Procedure Laterality Date  . Abdominal hysterectomy      complete  . Knee arthroscopy Right 07/16/2012    Procedure: ARTHROSCOPY KNEE;  Surgeon: Kerrin Champagne, MD;  Location: [facility];  Service: Orthopedics;  Laterality: Right;  Right knee arthroscopy with chondroplastic shaving of retropatella surface  . Colonoscopy    . Esophagogastroduodenoscopy    . Bladder tacked    . Knee arthroplasty Right 07/18/2013    Procedure: COMPUTER ASSISTED RIGHT TOTAL KNEE ARTHROPLASTY;  Surgeon: Kerrin Champagne, MD;  Location: [facility];  Service: Orthopedics;  Laterality: Right;   Family History  Problem Relation Age of Onset  . Heart attack Mother   . Stroke Mother   . Diabetes Mellitus II Mother   . Lymphoma Father    Social History  Substance Use Topics  . Smoking status: Current Every Day Smoker -- 1.00 packs/day for 40 years    Types: Cigarettes  . Smokeless tobacco: Never Used     Comment: nothing since 07/11/13  . Alcohol Use: No   OB History    No data available     Review of Systems  Constitutional: Negative for fever and chills.  HENT: Negative for congestion.   Eyes: Negative for visual disturbance.  Respiratory: Negative for cough and shortness of breath.   Cardiovascular: Negative.   Gastrointestinal: Positive for nausea. Negative for vomiting, diarrhea and constipation.  Genitourinary: Positive for dysuria, frequency and flank pain.  Musculoskeletal: Positive for back pain.  Skin: Negative for rash.  Neurological: Negative for dizziness and headaches.      Allergies  Review of patient's allergies indicates no known allergies.  Home Medications   Prior to Admission medications   Medication Sig Start Date End Date Taking? Authorizing Provider  Pregabalin (LYRICA PO) Take by mouth.   Yes Historical Provider, MD  albuterol (PROVENTIL) (2.5 MG/3ML) 0.083% nebulizer solution Take 2.5 mg by  nebulization every 4 (four) hours as needed for wheezing or shortness of breath.    Historical Provider, MD  aspirin EC 81 MG tablet Take 1 tablet (81 mg total) by mouth daily. 03/16/15   Catarina Hartshornavid Tat, MD  budesonide-formoterol [facility](SYMBICORT) 160-4.5 MCG/ACT inhaler Inhale 2 puffs into the lungs 2 (two) times daily.    Historical Provider, MD  cephALEXin (KEFLEX) 500 MG capsule Take 1 capsule (500 mg total) by mouth 4 (four) times daily. 08/23/15   Chase PicketJaime Pilcher Levon Boettcher, PA-C  diclofenac sodium (VOLTAREN) 1 % GEL Apply 2-5 g topically 3 (three) times daily as needed (pain).    Historical Provider, MD  dicyclomine (BENTYL) 20 MG tablet Take 1 tablet (20 mg total) by mouth 2 (two) times daily. 03/04/15   Rolland PorterMark James, MD  docusate sodium (COLACE) 100 MG capsule Take 1 capsule (100 mg total) by mouth 2 (two) times daily. 03/16/15   Catarina Hartshornavid Tat, MD  gabapentin (NEURONTIN) 300 MG capsule Take 600 mg by mouth 2 (two) times daily. At noon and at bedtime    Historical Provider, MD  levothyroxine (SYNTHROID, LEVOTHROID) 25 MCG tablet Take 25 mcg by mouth daily before breakfast.    Historical Provider, MD  lidocaine (XYLOCAINE) 2 % jelly Apply 1 application topically 2 (two) times daily as needed (pain). Apply 1/4 inch to knee as needed    Historical Provider, MD  lubiprostone (AMITIZA) 24 MCG capsule Take 24 mcg by mouth 2 (two) times daily.    Historical Provider, MD  omeprazole (PRILOSEC) 20 MG capsule Take 20 mg by mouth 2 (two) times daily.    Historical Provider, MD  ondansetron (ZOFRAN ODT) 4 MG disintegrating tablet Take 1 tablet (4 mg total) by mouth every 8 (eight) hours as needed for nausea. 03/04/15   Rolland PorterMark James, MD  oxyCODONE (ROXICODONE) 15 MG immediate release tablet Take 1 tablet (15 mg total) by mouth every 6 (six) hours as needed for pain (Baseline pain control). 07/20/13   Kerrin ChampagneJames E Nitka, MD  promethazine (PHENERGAN) 25 MG tablet Take 25 mg by mouth 3 (three) times daily as needed for nausea or vomiting.     Historical Provider, MD  senna (SENOKOT) 8.6 MG TABS tablet Take 1 tablet (8.6 mg total) by mouth daily. 03/16/15   Catarina Hartshornavid Tat, MD  sulfamethoxazole-trimethoprim (BACTRIM DS,SEPTRA DS) 800-160 MG tablet Take 1 tablet by mouth every 12 (twelve) hours. 03/16/15   Catarina Hartshornavid Tat, MD  traZODone (DESYREL) 100 MG tablet Take 100 mg by mouth at bedtime.     Historical Provider, MD  venlafaxine XR (EFFEXOR-XR) 150 MG 24 hr capsule Take 150 mg by mouth daily.    Historical Provider, MD   BP 154/68 mmHg  Pulse 54  Temp(Src) 98.3 F (36.8 C) (Oral)  Resp 18  Ht  (1.626 m)  Wt 53.524 kg  BMI 20.24 kg/m2  SpO2 96% Physical Exam  Constitutional: She is oriented to person, place, and time. She appears well-developed and well-nourished.  Alert, NAD  HENT:  Head: Normocephalic and atraumatic.  Cardiovascular: Normal rate, regular rhythm and normal heart sounds.  Exam reveals no gallop and no friction rub.   No murmur heard. Pulmonary/Chest: Effort normal and breath sounds normal. No respiratory distress. She has no wheezes. She has no rales. She exhibits no tenderness.  Abdominal: Soft. Bowel sounds are normal. She exhibits no distension and no mass. There is no rebound and no guarding.  No abdominal tenderness. + right flank tenderness.   Musculoskeletal: She exhibits no edema.  Neurological: She is alert and oriented to person, place, and time.  Skin: Skin is warm and dry.  Nursing note and vitals reviewed.   ED Course  Procedures (including critical care time) Labs Review Labs Reviewed  URINALYSIS, ROUTINE W REFLEX MICROSCOPIC (NOT AT [facility]) - Abnormal; Notable for the following:    Hgb urine dipstick SMALL (*)    Leukocytes, UA TRACE (*)    All other components within normal limits  URINE MICROSCOPIC-ADD ON - Abnormal; Notable for the following:    Squamous Epithelial / LPF 0-5 (*)    Bacteria, UA FEW (*)    All other components within normal limits  URINE CULTURE    Imaging Review Ct  Renal Stone Study  08/23/2015  CLINICAL DATA:  Mid back pain with urinary frequency and nausea for several days. History of COPD, renal insufficiency and hysterectomy. EXAM: CT ABDOMEN AND PELVIS WITHOUT CONTRAST TECHNIQUE: Multidetector CT imaging of the abdomen and pelvis was performed following the standard protocol without IV contrast. COMPARISON:  CT 03/15/2015 and 08/31/2014 FINDINGS: Lower chest: Clear lung bases. No significant pleural or pericardial effusion. Hepatobiliary: As evaluated in the noncontrast state, the liver appears stable without focal abnormality. No evidence of gallstones, gallbladder wall thickening or biliary dilatation. Pancreas: There is chronic dilatation of the pancreatic duct 4 mm. This is similar to previous studies. This was previously evaluated by MRI 03/30/2013. No evidence of pancreatic mass, surrounding inflammation or parenchymal calcification. Spleen: Normal in size without focal abnormality. Adrenals/Urinary Tract: Both adrenal glands appear normal. No evidence of urinary tract calculus or hydronephrosis. No focal renal abnormalities are seen on noncontrast imaging. The bladder appears unremarkable aside from probable pelvic floor laxity. Stomach/Bowel: No evidence of bowel wall thickening, distention or surrounding inflammatory change. Moderate stool throughout the colon. The appendix appears normal. Vascular/Lymphatic: There are no enlarged abdominal or pelvic lymph nodes. Moderate aortic and side branch atherosclerosis. Reproductive: Hysterectomy. No evidence of adnexal mass. As above, probable pelvic floor laxity. Other: No ascites or suspicious abdominal wall findings. Musculoskeletal: No acute or significant osseous findings. Mild facet disease in the lower lumbar spine. IMPRESSION: 1. No acute findings or explanation for the patient's symptoms. 2. Probable pelvic floor laxity post hysterectomy. The kidneys and ureters appear unremarkable. 3. 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 4. Mild aortoiliac atherosclerosis. Electronically Signed   By: Carey Bullocks M.D.   On: 08/23/2015 13:48   I have personally reviewed and evaluated these images and lab results as part of my medical decision-making.   EKG Interpretation None      MDM   Final diagnoses:  Leukocytes in urine   Patricia Sutton is a 70 y.o. female who presents for right flank/back pain associated with dysuria, urinary  frequency, and nausea. UA shows trace leuks 0-5 white blood cells, and few bacteria. Small amount of hgb. Urine sent for culture. CT renal study shows no evidence of stone or other acute findings. Patient recently treated with Bactrim for UTI on 3/08. Given she is symptomatic, Will treat with Keflex. Urine was sent for culture. Patient informed to follow up with her PCP in 2 days. Return precautions and home care instructions given. All questions answered.   Patient discussed with Dr. Clayborne Dana who agrees with treatment plan.   [facility] Larosa Rhines, PA-C 08/23/15 1446  Marily Memos, MD 08/25/15 1045

## 2015-08-25 LAB — URINE CULTURE

## 2015-09-20 ENCOUNTER — Emergency Department (HOSPITAL_BASED_OUTPATIENT_CLINIC_OR_DEPARTMENT_OTHER)
Admission: EM | Admit: 2015-09-20 | Discharge: 2015-09-20 | Disposition: A | Discharge status: Home / Self Care | Payer: Medicare Other | Attending: Emergency Medicine | Admitting: Emergency Medicine

## 2015-09-20 ENCOUNTER — Encounter (HOSPITAL_BASED_OUTPATIENT_CLINIC_OR_DEPARTMENT_OTHER): Payer: Self-pay | Admitting: *Deleted

## 2015-09-20 DIAGNOSIS — Z79899 Other long term (current) drug therapy: Secondary | ICD-10-CM | POA: Insufficient documentation

## 2015-09-20 DIAGNOSIS — Z8701 Personal history of pneumonia (recurrent): Secondary | ICD-10-CM | POA: Diagnosis not present

## 2015-09-20 DIAGNOSIS — Z7982 Long term (current) use of aspirin: Secondary | ICD-10-CM | POA: Diagnosis not present

## 2015-09-20 DIAGNOSIS — G8929 Other chronic pain: Secondary | ICD-10-CM | POA: Diagnosis not present

## 2015-09-20 DIAGNOSIS — Z87448 Personal history of other diseases of urinary system: Secondary | ICD-10-CM | POA: Insufficient documentation

## 2015-09-20 DIAGNOSIS — G4730 Sleep apnea, unspecified: Secondary | ICD-10-CM | POA: Diagnosis not present

## 2015-09-20 DIAGNOSIS — K219 Gastro-esophageal reflux disease without esophagitis: Secondary | ICD-10-CM | POA: Insufficient documentation

## 2015-09-20 DIAGNOSIS — Z86010 Personal history of colonic polyps: Secondary | ICD-10-CM | POA: Insufficient documentation

## 2015-09-20 DIAGNOSIS — R109 Unspecified abdominal pain: Secondary | ICD-10-CM | POA: Diagnosis not present

## 2015-09-20 DIAGNOSIS — Z9981 Dependence on supplemental oxygen: Secondary | ICD-10-CM | POA: Insufficient documentation

## 2015-09-20 DIAGNOSIS — E039 Hypothyroidism, unspecified: Secondary | ICD-10-CM | POA: Diagnosis not present

## 2015-09-20 DIAGNOSIS — J449 Chronic obstructive pulmonary disease, unspecified: Secondary | ICD-10-CM | POA: Diagnosis not present

## 2015-09-20 DIAGNOSIS — Z7951 Long term (current) use of inhaled steroids: Secondary | ICD-10-CM | POA: Diagnosis not present

## 2015-09-20 DIAGNOSIS — M1990 Unspecified osteoarthritis, unspecified site: Secondary | ICD-10-CM | POA: Insufficient documentation

## 2015-09-20 DIAGNOSIS — M549 Dorsalgia, unspecified: Secondary | ICD-10-CM | POA: Diagnosis present

## 2015-09-20 DIAGNOSIS — F17210 Nicotine dependence, cigarettes, uncomplicated: Secondary | ICD-10-CM | POA: Insufficient documentation

## 2015-09-20 DIAGNOSIS — F329 Major depressive disorder, single episode, unspecified: Secondary | ICD-10-CM | POA: Diagnosis not present

## 2015-09-20 DIAGNOSIS — G4700 Insomnia, unspecified: Secondary | ICD-10-CM | POA: Diagnosis not present

## 2015-09-20 DIAGNOSIS — F419 Anxiety disorder, unspecified: Secondary | ICD-10-CM | POA: Diagnosis not present

## 2015-09-20 MED ORDER — LIDOCAINE 5 % EX PTCH
2.0000 | MEDICATED_PATCH | CUTANEOUS | Status: DC
  Filled 2015-09-20: qty 2

## 2015-09-20 MED ORDER — IBUPROFEN 400 MG PO TABS
600.0000 mg | ORAL_TABLET | Freq: Once | ORAL | Status: AC
  Administered 2015-09-20: 600 mg via ORAL
  Filled 2015-09-20: qty 1

## 2015-09-20 MED ORDER — MORPHINE SULFATE (PF) 4 MG/ML IV SOLN
4.0000 mg | Freq: Once | INTRAVENOUS | Status: AC
  Administered 2015-09-20: 4 mg via INTRAMUSCULAR
  Filled 2015-09-20: qty 1

## 2015-09-20 NOTE — Discharge Instructions (Signed)
Chronic Back Pain Ms. Patricia Sutton, continue you home medication for your back pain.  See neurosurgery within 3 days for close follow up. If symptoms worsen, come back to the ED immediately. Thank you.  When back pain lasts longer than 3 months, it is called chronic back pain.People with chronic back pain often go through certain periods that are more intense (flare-ups).  CAUSES Chronic back pain can be caused by wear and tear (degeneration) on different structures in your back. These structures include:  The bones of your spine (vertebrae) and the joints surrounding your spinal cord and nerve roots (facets).  The strong, fibrous tissues that connect your vertebrae (ligaments). Degeneration of these structures may result in pressure on your nerves. This can lead to constant pain. HOME CARE INSTRUCTIONS  Avoid bending, heavy lifting, prolonged sitting, and activities which make the problem worse.  Take brief periods of rest throughout the day to reduce your pain. Lying down or standing usually is better than sitting while you are resting.  Take over-the-counter or prescription medicines only as directed by your caregiver. SEEK IMMEDIATE MEDICAL CARE IF:   You have weakness or numbness in one of your legs or feet.  You have trouble controlling your bladder or bowels.  You have nausea, vomiting, abdominal pain, shortness of breath, or fainting.   This information is not intended to replace advice given to you by your health care provider. Make sure you discuss any questions you have with your health care provider.   Document Released: 06/22/2004 Document Revised: 08/07/2011 Document Reviewed: 11/02/2014 Elsevier Interactive Patient Education [employer]2016 [employer] Inc.

## 2015-09-20 NOTE — ED Provider Notes (Signed)
CSN: [id number]     Arrival date & time 09/20/15  0320 History   None    Chief Complaint  Patient presents with  . Back Pain     (Consider location/radiation/quality/duration/timing/severity/associated sxs/prior Treatment) HPI  Patricia Sutton is a 70 y.o. female with past medical history of chronic back pain presenting today with back pain. Patient states that this feels like her chronic back pain that has gotten worse today. It is in her bilateral flank, and worse on the right side with some radiation to her right gluteus. Patricia Sutton had a recent MRI showing pinched nerves and slipped disc. Patricia Sutton is going to follow-up with her neurosurgeon May. Patricia Sutton normally takes oxycodone at home and has taken 3 doses without any significant relief. Patient states Patricia Sutton also takes Tylenol and ibuprofen. Patricia Sutton is also on Lyrica. Patricia Sutton states when Patricia Sutton comes into the emergency department morphine controls her pain very well. Patricia Sutton denies trying lidocaine patches in the past. Patient denies any incontinence or problems going to the bathroom. Patricia Sutton has no dysuria or hematuria. Patricia Sutton has no lower extremity numbness. Patricia Sutton denies any other neurological complaints. Patricia Sutton has no further complaints.  10 Systems reviewed and are negative for acute change except as noted in the HPI.     Past Medical History  Diagnosis Date  . COPD (chronic obstructive pulmonary disease) (HCC)     denies SOB  . Sleep apnea     no CPAP; uses O2 at night 1l/min.  . Bone spur of other site     spine  . Insomnia     takes Trazodone nightly and Ambien  . Hypothyroidism     takes Synthroid daily  . PONV (postoperative nausea and vomiting)   . Pneumonia     hx of;last time about 6+yrs ago  . History of bronchitis     last time about 6+yrs ago  . Arthritis   . Joint pain   . Joint swelling   . Chronic back pain     spurs on spine  . GERD (gastroesophageal reflux disease)     takes Omeprazole daily  . History of colon polyps   . Urinary urgency   .  History of kidney stones   . Nocturia   . Depression     takes Effexor daily  . Anxiety   . Asthma   . Renal insufficiency    Past Surgical History  Procedure Laterality Date  . Abdominal hysterectomy      complete  . Knee arthroscopy Right 07/16/2012    Procedure: ARTHROSCOPY KNEE;  Surgeon: Kerrin ChampagneJames E Nitka, MD;  Location: [facility];  Service: Orthopedics;  Laterality: Right;  Right knee arthroscopy with chondroplastic shaving of retropatella surface  . Colonoscopy    . Esophagogastroduodenoscopy    . Bladder tacked    . Knee arthroplasty Right 07/18/2013    Procedure: COMPUTER ASSISTED RIGHT TOTAL KNEE ARTHROPLASTY;  Surgeon: Kerrin ChampagneJames E Nitka, MD;  Location: [facility];  Service: Orthopedics;  Laterality: Right;   Family History  Problem Relation Age of Onset  . Heart attack Mother   . Stroke Mother   . Diabetes Mellitus II Mother   . Lymphoma Father    Social History  Substance Use Topics  . Smoking status: Current Every Day Smoker -- 1.00 packs/day for 40 years    Types: Cigarettes  . Smokeless tobacco: Never Used     Comment: nothing since 07/11/13  . Alcohol Use: No   OB History  No data available     Review of Systems    Allergies  Review of patient's allergies indicates no known allergies.  Home Medications   Prior to Admission medications   Medication Sig Start Date End Date Taking? Authorizing Provider  albuterol (PROVENTIL) (2.5 MG/3ML) 0.083% nebulizer solution Take 2.5 mg by nebulization every 4 (four) hours as needed for wheezing or shortness of breath.    Historical Provider, MD  aspirin EC 81 MG tablet Take 1 tablet (81 mg total) by mouth daily. 03/16/15   Catarina Hartshorn, MD  budesonide-formoterol [facility]) 160-4.5 MCG/ACT inhaler Inhale 2 puffs into the lungs 2 (two) times daily.    Historical Provider, MD  cephALEXin (KEFLEX) 500 MG capsule Take 1 capsule (500 mg total) by mouth 4 (four) times daily. 08/23/15   Chase Picket Ward, PA-C   diclofenac sodium (VOLTAREN) 1 % GEL Apply 2-5 g topically 3 (three) times daily as needed (pain).    Historical Provider, MD  dicyclomine (BENTYL) 20 MG tablet Take 1 tablet (20 mg total) by mouth 2 (two) times daily. 03/04/15   Rolland Porter, MD  docusate sodium (COLACE) 100 MG capsule Take 1 capsule (100 mg total) by mouth 2 (two) times daily. 03/16/15   Catarina Hartshorn, MD  gabapentin (NEURONTIN) 300 MG capsule Take 600 mg by mouth 2 (two) times daily. At noon and at bedtime    Historical Provider, MD  levothyroxine (SYNTHROID, LEVOTHROID) 25 MCG tablet Take 25 mcg by mouth daily before breakfast.    Historical Provider, MD  lidocaine (XYLOCAINE) 2 % jelly Apply 1 application topically 2 (two) times daily as needed (pain). Apply 1/4 inch to knee as needed    Historical Provider, MD  lubiprostone (AMITIZA) 24 MCG capsule Take 24 mcg by mouth 2 (two) times daily.    Historical Provider, MD  omeprazole (PRILOSEC) 20 MG capsule Take 20 mg by mouth 2 (two) times daily.    Historical Provider, MD  ondansetron (ZOFRAN ODT) 4 MG disintegrating tablet Take 1 tablet (4 mg total) by mouth every 8 (eight) hours as needed for nausea. 03/04/15   Rolland Porter, MD  oxyCODONE (ROXICODONE) 15 MG immediate release tablet Take 1 tablet (15 mg total) by mouth every 6 (six) hours as needed for pain (Baseline pain control). 07/20/13   Kerrin Champagne, MD  Pregabalin (LYRICA PO) Take by mouth.    Historical Provider, MD  promethazine (PHENERGAN) 25 MG tablet Take 25 mg by mouth 3 (three) times daily as needed for nausea or vomiting.    Historical Provider, MD  senna (SENOKOT) 8.6 MG TABS tablet Take 1 tablet (8.6 mg total) by mouth daily. 03/16/15   Catarina Hartshorn, MD  sulfamethoxazole-trimethoprim (BACTRIM DS,SEPTRA DS) 800-160 MG tablet Take 1 tablet by mouth every 12 (twelve) hours. 03/16/15   Catarina Hartshorn, MD  traZODone (DESYREL) 100 MG tablet Take 100 mg by mouth at bedtime.     Historical Provider, MD  venlafaxine XR (EFFEXOR-XR) 150 MG  24 hr capsule Take 150 mg by mouth daily.    Historical Provider, MD   There were no vitals taken for this visit. Physical Exam  Constitutional: Patricia Sutton is oriented to person, place, and time. Patricia Sutton appears well-developed and well-nourished. No distress.  HENT:  Head: Normocephalic and atraumatic.  Nose: Nose normal.  Mouth/Throat: Oropharynx is clear and moist. No oropharyngeal exudate.  Eyes: Conjunctivae and EOM are normal. Pupils are equal, round, and reactive to light. No scleral icterus.  Neck: Normal range of motion.  Neck supple. No JVD present. No tracheal deviation present. No thyromegaly present.  Cardiovascular: Normal rate, regular rhythm and normal heart sounds.  Exam reveals no gallop and no friction rub.   No murmur heard. Pulmonary/Chest: Effort normal and breath sounds normal. No respiratory distress. Patricia Sutton has no wheezes. Patricia Sutton exhibits no tenderness.  Abdominal: Soft. Bowel sounds are normal. Patricia Sutton exhibits no distension and no mass. There is no tenderness. There is no rebound and no guarding.  Musculoskeletal: Normal range of motion. Patricia Sutton exhibits no edema or tenderness.  Lymphadenopathy:    Patricia Sutton has no cervical adenopathy.  Neurological: Patricia Sutton is alert and oriented to person, place, and time. No cranial nerve deficit. Patricia Sutton exhibits normal muscle tone.  Normal strength and sensation in all extremities. Normal cerebellar testing. Normal gait.  Skin: Skin is warm and dry. No rash noted. No erythema. No pallor.  Nursing note and vitals reviewed.   ED Course  Procedures (including critical care time) Labs Review Labs Reviewed - No data to display  Imaging Review No results found. I have personally reviewed and evaluated these images and lab results as part of my medical decision-making.   EKG Interpretation None      MDM   Final diagnoses:  Chronic back pain    Patient presents emergency department for chronic back pain. Patricia Sutton was given morphine as Patricia Sutton states this works for  her in the past. 4 mg intramuscular injection. We'll also attempt to find lidocaine patches for additional relief. Patricia Sutton is advised to see her neurosurgeon within 3 days for close follow-up. I do not believe imaging these to be repeated as Patricia Sutton just had MRI.  Upon repeat evaluation, patient feels better.  Her neuro exam remains normal.  Patricia Sutton appears well and in NAD.  VS are normal.  Patient safe for DC.    Tomasita Crumble, MD 09/20/15 [phone]

## 2015-09-20 NOTE — ED Notes (Signed)
Pt with a history of chronic back pain. Worse on right lower side and radiates down both legs but right leg is worse. Denies any urinary symptoms, fevers, or injury. Pt had a recent MRI and she has a f/u with her neurosurgeon in May.

## 2016-01-17 ENCOUNTER — Encounter (HOSPITAL_BASED_OUTPATIENT_CLINIC_OR_DEPARTMENT_OTHER): Payer: Self-pay

## 2016-01-17 ENCOUNTER — Emergency Department (HOSPITAL_BASED_OUTPATIENT_CLINIC_OR_DEPARTMENT_OTHER)
Admission: EM | Admit: 2016-01-17 | Discharge: 2016-01-17 | Disposition: A | Discharge status: Home / Self Care | Payer: Medicare Other | Attending: Emergency Medicine | Admitting: Emergency Medicine

## 2016-01-17 ENCOUNTER — Emergency Department (HOSPITAL_BASED_OUTPATIENT_CLINIC_OR_DEPARTMENT_OTHER): Payer: Medicare Other

## 2016-01-17 DIAGNOSIS — R109 Unspecified abdominal pain: Secondary | ICD-10-CM | POA: Diagnosis present

## 2016-01-17 DIAGNOSIS — F17210 Nicotine dependence, cigarettes, uncomplicated: Secondary | ICD-10-CM | POA: Diagnosis not present

## 2016-01-17 DIAGNOSIS — Z7982 Long term (current) use of aspirin: Secondary | ICD-10-CM | POA: Diagnosis not present

## 2016-01-17 DIAGNOSIS — R300 Dysuria: Secondary | ICD-10-CM | POA: Insufficient documentation

## 2016-01-17 DIAGNOSIS — E039 Hypothyroidism, unspecified: Secondary | ICD-10-CM | POA: Insufficient documentation

## 2016-01-17 DIAGNOSIS — J449 Chronic obstructive pulmonary disease, unspecified: Secondary | ICD-10-CM | POA: Diagnosis not present

## 2016-01-17 DIAGNOSIS — J45909 Unspecified asthma, uncomplicated: Secondary | ICD-10-CM | POA: Insufficient documentation

## 2016-01-17 HISTORY — DX: Dorsalgia, unspecified: M54.9

## 2016-01-17 HISTORY — DX: Other chronic pain: G89.29

## 2016-01-17 LAB — CBC WITH DIFFERENTIAL/PLATELET
BASOS PCT: 0 %
Basophils Absolute: 0 10*3/uL (ref 0.0–0.1)
Eosinophils Absolute: 0.2 10*3/uL (ref 0.0–0.7)
Eosinophils Relative: 1 %
HEMATOCRIT: 41.5 % (ref 36.0–46.0)
HEMOGLOBIN: 13.5 g/dL (ref 12.0–15.0)
LYMPHS ABS: 5.8 10*3/uL — AB (ref 0.7–4.0)
LYMPHS PCT: 38 %
MCH: 30.3 pg (ref 26.0–34.0)
MCHC: 32.5 g/dL (ref 30.0–36.0)
MCV: 93.3 fL (ref 78.0–100.0)
MONOS PCT: 10 %
Monocytes Absolute: 1.6 10*3/uL — ABNORMAL HIGH (ref 0.1–1.0)
NEUTROS ABS: 7.9 10*3/uL — AB (ref 1.7–7.7)
NEUTROS PCT: 51 %
Platelets: 372 10*3/uL (ref 150–400)
RBC: 4.45 MIL/uL (ref 3.87–5.11)
RDW: 14.9 % (ref 11.5–15.5)
WBC: 15.5 10*3/uL — ABNORMAL HIGH (ref 4.0–10.5)

## 2016-01-17 LAB — URINALYSIS, ROUTINE W REFLEX MICROSCOPIC
Bilirubin Urine: NEGATIVE
Glucose, UA: NEGATIVE mg/dL
Hgb urine dipstick: NEGATIVE
Ketones, ur: NEGATIVE mg/dL
LEUKOCYTES UA: NEGATIVE
Nitrite: NEGATIVE
PROTEIN: NEGATIVE mg/dL
SPECIFIC GRAVITY, URINE: 1.015 (ref 1.005–1.030)
pH: 6 (ref 5.0–8.0)

## 2016-01-17 LAB — COMPREHENSIVE METABOLIC PANEL
ALBUMIN: 3.9 g/dL (ref 3.5–5.0)
ALK PHOS: 89 U/L (ref 38–126)
ALT: 19 U/L (ref 14–54)
ANION GAP: 7 (ref 5–15)
AST: 32 U/L (ref 15–41)
BILIRUBIN TOTAL: 0.1 mg/dL — AB (ref 0.3–1.2)
BUN: 18 mg/dL (ref 6–20)
CALCIUM: 9.1 mg/dL (ref 8.9–10.3)
CO2: 29 mmol/L (ref 22–32)
Chloride: 107 mmol/L (ref 101–111)
Creatinine, Ser: 0.82 mg/dL (ref 0.44–1.00)
GFR calc Af Amer: >60 mL/min (ref >60)
GLUCOSE: 67 mg/dL (ref 65–99)
Potassium: 3.3 mmol/L — ABNORMAL LOW (ref 3.5–5.1)
Sodium: 143 mmol/L (ref 135–145)
TOTAL PROTEIN: 7.5 g/dL (ref 6.5–8.1)

## 2016-01-17 LAB — LIPASE, BLOOD: LIPASE: 42 U/L (ref 11–51)

## 2016-01-17 MED ORDER — IOPAMIDOL (ISOVUE-300) INJECTION 61%
100.0000 mL | Freq: Once | INTRAVENOUS | Status: AC | PRN
  Administered 2016-01-17: 100 mL via INTRAVENOUS

## 2016-01-17 MED ORDER — CEPHALEXIN 250 MG PO CAPS
500.0000 mg | ORAL_CAPSULE | Freq: Once | ORAL | Status: AC
  Administered 2016-01-17: 500 mg via ORAL
  Filled 2016-01-17: qty 2

## 2016-01-17 MED ORDER — ONDANSETRON 8 MG PO TBDP
8.0000 mg | ORAL_TABLET | Freq: Once | ORAL | Status: AC
  Administered 2016-01-17: 8 mg via ORAL
  Filled 2016-01-17: qty 1

## 2016-01-17 MED ORDER — CEPHALEXIN 500 MG PO CAPS: 500.0000 mg | ORAL_CAPSULE | Freq: Three times a day (TID) | ORAL | 0 refills | Status: DC

## 2016-01-17 MED ORDER — MORPHINE SULFATE (PF) 4 MG/ML IV SOLN
4.0000 mg | INTRAVENOUS | Status: DC | PRN
  Administered 2016-01-17: 4 mg via INTRAVENOUS
  Filled 2016-01-17: qty 1

## 2016-01-17 MED ORDER — ONDANSETRON HCL 4 MG/2ML IJ SOLN
4.0000 mg | Freq: Once | INTRAMUSCULAR | Status: AC
  Administered 2016-01-17: 4 mg via INTRAVENOUS
  Filled 2016-01-17: qty 2

## 2016-01-17 MED ORDER — ONDANSETRON 8 MG PO TBDP: 8.0000 mg | ORAL_TABLET | Freq: Three times a day (TID) | ORAL | 0 refills | Status: DC | PRN

## 2016-01-17 NOTE — ED Notes (Signed)
MD at bedside. 

## 2016-01-17 NOTE — ED Triage Notes (Signed)
C/o dysuria x today-NAD-steady gait

## 2016-01-17 NOTE — ED Notes (Signed)
Patient transported to CT 

## 2016-01-17 NOTE — ED Provider Notes (Signed)
[facility] Provider Note   CSN: [id number] Arrival date & time: 01/17/16  1649  By signing my name below, I, Nelwyn SalisburyJoshua Fowler, attest that this documentation has been prepared under the direction and in the presence of Azalia BilisKevin Mouhamad Teed, MD . Electronically Signed: Nelwyn SalisburyJoshua Fowler, Scribe. 01/17/2016. 5:26 PM.  History   Chief Complaint Chief Complaint  Patient presents with  . Dysuria   The history is provided by the patient. No language interpreter was used.     HPI Comments:  Patricia Sutton is a 70 y.o. female who presents to the Emergency Department complaining of constant unchanged abdominal pain, onset this morning. She reports associated dysuria, urinary frequency, and nausea. She denies vomiting, diarrhea, back pain, or fever. Pt states she has a prescription for phenergan which she has taken with mild relief. Her also notes her abdominal pain is worsened by palpation.   Past Medical History:  Diagnosis Date  . Anxiety   . Arthritis   . Asthma   . Back pain, chronic   . Bone spur of other site    spine  . Chronic back pain    spurs on spine  . COPD (chronic obstructive pulmonary disease) (HCC)    denies SOB  . Depression    takes Effexor daily  . GERD (gastroesophageal reflux disease)    takes Omeprazole daily  . History of bronchitis    last time about 6+yrs ago  . History of colon polyps   . History of kidney stones   . Hypothyroidism    takes Synthroid daily  . Insomnia    takes Trazodone nightly and Ambien  . Joint pain   . Joint swelling   . Nocturia   . Pneumonia    hx of;last time about 6+yrs ago  . PONV (postoperative nausea and vomiting)   . Renal insufficiency   . Sleep apnea    no CPAP; uses O2 at night 1l/min.  Marland Kitchen. Urinary urgency     Patient Active Problem List   Diagnosis Date Noted  . Tobacco use disorder 03/15/2015  . UTI (lower urinary tract infection) 03/15/2015  . COPD (chronic obstructive pulmonary disease) (HCC) 03/15/2015  .  Hypothyroidism 03/15/2015  . Chronic pain syndrome 03/15/2015  . Dehydration 03/15/2015  . Elevated troponin 03/14/2015  . Osteoarthritis of right knee 07/18/2013    Class: Chronic  . Loose body of right knee 07/16/2012    Class: Chronic    Past Surgical History:  Procedure Laterality Date  . ABDOMINAL HYSTERECTOMY     complete  . bladder tacked    . COLONOSCOPY    . ESOPHAGOGASTRODUODENOSCOPY    . KNEE ARTHROPLASTY Right 07/18/2013   Procedure: COMPUTER ASSISTED RIGHT TOTAL KNEE ARTHROPLASTY;  Surgeon: Kerrin ChampagneJames E Nitka, MD;  Location: [facility];  Service: Orthopedics;  Laterality: Right;  . KNEE ARTHROSCOPY Right 07/16/2012   Procedure: ARTHROSCOPY KNEE;  Surgeon: Kerrin ChampagneJames E Nitka, MD;  Location: [facility];  Service: Orthopedics;  Laterality: Right;  Right knee arthroscopy with chondroplastic shaving of retropatella surface    OB History    No data available       Home Medications    Prior to Admission medications   Medication Sig Start Date End Date Taking? Authorizing Provider  albuterol (PROVENTIL) (2.5 MG/3ML) 0.083% nebulizer solution Take 2.5 mg by nebulization every 4 (four) hours as needed for wheezing or shortness of breath.    Historical Provider, MD  aspirin EC 81 MG tablet Take 1  tablet (81 mg total) by mouth daily. 03/16/15   Catarina Hartshorn, MD  budesonide-formoterol [facility]) 160-4.5 MCG/ACT inhaler Inhale 2 puffs into the lungs 2 (two) times daily.    Historical Provider, MD  diclofenac sodium (VOLTAREN) 1 % GEL Apply 2-5 g topically 3 (three) times daily as needed (pain).    Historical Provider, MD  docusate sodium (COLACE) 100 MG capsule Take 1 capsule (100 mg total) by mouth 2 (two) times daily. 03/16/15   Catarina Hartshorn, MD  levothyroxine (SYNTHROID, LEVOTHROID) 25 MCG tablet Take 25 mcg by mouth daily before breakfast.    Historical Provider, MD  lidocaine (XYLOCAINE) 2 % jelly Apply 1 application topically 2 (two) times daily as needed (pain). Apply 1/4 inch  to knee as needed    Historical Provider, MD  lubiprostone (AMITIZA) 24 MCG capsule Take 24 mcg by mouth 2 (two) times daily.    Historical Provider, MD  omeprazole (PRILOSEC) 20 MG capsule Take 20 mg by mouth 2 (two) times daily.    Historical Provider, MD  ondansetron (ZOFRAN ODT) 4 MG disintegrating tablet Take 1 tablet (4 mg total) by mouth every 8 (eight) hours as needed for nausea. 03/04/15   Rolland Porter, MD  oxyCODONE (ROXICODONE) 15 MG immediate release tablet Take 1 tablet (15 mg total) by mouth every 6 (six) hours as needed for pain (Baseline pain control). 07/20/13   Kerrin Champagne, MD  oxyCODONE-acetaminophen (PERCOCET) 10-325 MG tablet Take 1 tablet by mouth every 4 (four) hours as needed for pain.    Historical Provider, MD  Pregabalin (LYRICA PO) Take by mouth.    Historical Provider, MD  promethazine (PHENERGAN) 25 MG tablet Take 25 mg by mouth 3 (three) times daily as needed for nausea or vomiting.    Historical Provider, MD  traZODone (DESYREL) 100 MG tablet Take 100 mg by mouth at bedtime.     Historical Provider, MD  venlafaxine XR (EFFEXOR-XR) 150 MG 24 hr capsule Take 150 mg by mouth daily.    Historical Provider, MD    Family History Family History  Problem Relation Age of Onset  . Heart attack Mother   . Stroke Mother   . Diabetes Mellitus II Mother   . Lymphoma Father     Social History Social History  Substance Use Topics  . Smoking status: Current Every Day Smoker    Packs/day: 1.00    Years: 40.00    Types: Cigarettes  . Smokeless tobacco: Never Used  . Alcohol use No     Allergies   Review of patient's allergies indicates no known allergies.   Review of Systems Review of Systems 10 Systems reviewed and are negative for acute change except as noted in the HPI.   Physical Exam Updated Vital Signs BP 164/76 (BP Location: Right Arm)   Pulse 71   Temp 99.5 F (37.5 C) (Oral)   Resp 20   Ht 5\' 3"  (1.6 m)   Wt 114 lb (51.7 kg)   SpO2 99%   BMI  20.19 kg/m   Physical Exam  Constitutional: She is oriented to person, place, and time. She appears well-developed and well-nourished.  HENT:  Head: Normocephalic.  Eyes: EOM are normal.  Neck: Normal range of motion.  Pulmonary/Chest: Effort normal.  Abdominal: She exhibits no distension. There is tenderness. There is no rebound and no guarding.  Epigastric tenderness, No guarding or rebound.   Musculoskeletal: Normal range of motion.  Neurological: She is alert and oriented to person, place, and  time.  Psychiatric: She has a normal mood and affect.  Nursing note and vitals reviewed.    ED Treatments / Results  DIAGNOSTIC STUDIES:  Oxygen Saturation is 99% on RA, normal by my interpretation.    COORDINATION OF CARE:  5:25 PM Discussed treatment plan with pt at bedside and pt agreed to plan.  Labs (all labs ordered are listed, but only abnormal results are displayed) Labs Reviewed  CBC WITH DIFFERENTIAL/PLATELET - Abnormal; Notable for the following:       Result Value   WBC 15.5 (*)    Neutro Abs 7.9 (*)    Lymphs Abs 5.8 (*)    Monocytes Absolute 1.6 (*)    All other components within normal limits  COMPREHENSIVE METABOLIC PANEL - Abnormal; Notable for the following:    Potassium 3.3 (*)    Total Bilirubin 0.1 (*)    All other components within normal limits  URINE CULTURE  URINALYSIS, ROUTINE W REFLEX MICROSCOPIC (NOT AT [facility])  LIPASE, BLOOD    EKG  EKG Interpretation None       Radiology Ct Abdomen Pelvis W Contrast  Result Date: 01/17/2016 CLINICAL DATA:  Upper abdominal pain, nausea, and history of small bowel obstruction. EXAM: CT ABDOMEN AND PELVIS WITH CONTRAST TECHNIQUE: Multidetector CT imaging of the abdomen and pelvis was performed using the standard protocol following bolus administration of intravenous contrast. CONTRAST:  ISOVUE-300 IOPAMIDOL (ISOVUE-300) INJECTION 61% COMPARISON:  08/23/2015 FINDINGS: Lower chest and abdominal wall:  Stable small subcutaneous nodule in the epigastrium. Hepatobiliary: No focal liver abnormality.No evidence of biliary obstruction or stone. Pancreas: Chronic prominence of the main duct, today measuring up to 3 mm. No visible obstructive process/mass. Spleen: Unremarkable. Adrenals/Urinary Tract: Negative adrenals. No hydronephrosis or stone. Unremarkable bladder. Stomach/Bowel:  No obstruction. No appendicitis. Reproductive:Hysterectomy and possible oophorectomies. No adnexal mass. Mild pelvic floor laxity. Vascular/Lymphatic: Diffuse atheromatous calcification and wall thickening of the aorta and branch vessels. No mass or adenopathy. Other: No ascites or pneumoperitoneum. Musculoskeletal: No acute abnormalities. Spondylosis and facet arthropathy. IMPRESSION: 1. No acute finding including bowel obstruction. Stable compared to 08/23/2015. 2. Aortic Atherosclerosis (ICD10-170.0) and other chronic findings described above. Electronically Signed   By: Marnee Spring M.D.   On: 01/17/2016 20:11    Procedures Procedures (including critical care time)  Medications Ordered in ED Medications  morphine 4 MG/ML injection 4 mg (4 mg Intravenous Given 01/17/16 1809)  cephALEXin (KEFLEX) capsule 500 mg (not administered)  ondansetron (ZOFRAN-ODT) disintegrating tablet 8 mg (8 mg Oral Given 01/17/16 1747)  ondansetron (ZOFRAN) injection 4 mg (4 mg Intravenous Given 01/17/16 1809)  iopamidol (ISOVUE-300) 61 % injection 100 mL (100 mLs Intravenous Contrast Given 01/17/16 1948)     Initial Impression / Assessment and Plan / ED Course  I have reviewed the triage vital signs and the nursing notes.  Pertinent labs & imaging results that were available during my care of the patient were reviewed by me and considered in my medical decision making (see chart for details).  Clinical Course    Patient is overall well-appearing.  Her urine shows no signs of infection but she continues having dysuria and urinary  frequency.  She feels like this urinary tract infection.  Urine culture sent.  She'll be started on a short course of Keflex.  Her antibiotics can be stopped by her primary care physician once the urine culture comes back.  CT scan of her abdomen is without acute pathology.  Overall well-appearing.  Primary care follow-up.  She understands to return to the ER for new or worsening symptoms  Final Clinical Impressions(s) / ED Diagnoses   Final diagnoses:  Abdominal pain, unspecified abdominal location  Dysuria    New Prescriptions New Prescriptions   CEPHALEXIN (KEFLEX) 500 MG CAPSULE    Take 1 capsule (500 mg total) by mouth 3 (three) times daily.   ONDANSETRON (ZOFRAN ODT) 8 MG DISINTEGRATING TABLET    Take 1 tablet (8 mg total) by mouth every 8 (eight) hours as needed for nausea or vomiting.    I personally performed the services described in this documentation, which was scribed in my presence. The recorded information has been reviewed and is accurate.        Azalia BilisKevin Tanis Burnley, MD 01/17/16 2024

## 2016-01-17 NOTE — ED Notes (Signed)
Per CT, pt will not be scanned until 2000. Pt drinking CT contrast at this time.

## 2016-01-19 LAB — URINE CULTURE

## 2016-03-22 ENCOUNTER — Other Ambulatory Visit (INDEPENDENT_AMBULATORY_CARE_PROVIDER_SITE_OTHER): Payer: Self-pay | Admitting: Specialist

## 2016-03-22 NOTE — Telephone Encounter (Signed)
Ok to refill 

## 2016-07-06 DIAGNOSIS — Z860100 Personal history of colon polyps, unspecified: Secondary | ICD-10-CM | POA: Insufficient documentation

## 2016-08-08 ENCOUNTER — Telehealth (INDEPENDENT_AMBULATORY_CARE_PROVIDER_SITE_OTHER): Payer: Self-pay | Admitting: Specialist

## 2016-08-08 NOTE — Telephone Encounter (Signed)
Called patient to move appt up to 10 am on 3/14

## 2016-08-09 ENCOUNTER — Ambulatory Visit (INDEPENDENT_AMBULATORY_CARE_PROVIDER_SITE_OTHER): Payer: Medicare Other | Admitting: Specialist

## 2016-08-09 ENCOUNTER — Encounter (INDEPENDENT_AMBULATORY_CARE_PROVIDER_SITE_OTHER): Payer: Self-pay | Admitting: Specialist

## 2016-08-09 ENCOUNTER — Ambulatory Visit (INDEPENDENT_AMBULATORY_CARE_PROVIDER_SITE_OTHER): Payer: Medicare Other

## 2016-08-09 VITALS — BP 140/70 | HR 63 | Ht 63.0 in | Wt 111.0 lb

## 2016-08-09 DIAGNOSIS — M4306 Spondylolysis, lumbar region: Secondary | ICD-10-CM | POA: Diagnosis not present

## 2016-08-09 DIAGNOSIS — M47816 Spondylosis without myelopathy or radiculopathy, lumbar region: Secondary | ICD-10-CM | POA: Diagnosis not present

## 2016-08-09 DIAGNOSIS — Z96651 Presence of right artificial knee joint: Secondary | ICD-10-CM | POA: Diagnosis not present

## 2016-08-09 DIAGNOSIS — M48062 Spinal stenosis, lumbar region with neurogenic claudication: Secondary | ICD-10-CM | POA: Diagnosis not present

## 2016-08-09 NOTE — Patient Instructions (Signed)
Avoid bending, stooping and avoid lifting weights greater than 10 lbs. Avoid prolong standing and walking. Avoid frequent bending and stooping  No lifting greater than 10 lbs. May use ice or moist heat for pain. Weight loss is of benefit. Handicap license is approved.  

## 2016-08-09 NOTE — Progress Notes (Signed)
Office Visit Note   Patient: Patricia Sutton           Date of Birth: Jul 11, 1945           MRN: [id number] Visit Date: 08/09/2016              Requested by: No referring provider defined for this encounter. PCP: PROVIDER NOT IN SYSTEM   Assessment & Plan: Visit Diagnoses:  1. Status post total right knee replacement   2. Spondylosis of lumbar spine   3. Spondylolysis of lumbar region   4. Spinal stenosis of lumbar region with neurogenic claudication   5. History of total right knee replacement     Plan: Avoid bending, stooping and avoid lifting weights greater than 10 lbs. Avoid prolong standing and walking. Avoid frequent bending and stooping  No lifting greater than 10 lbs. May use ice or moist heat for pain. Weight loss is of benefit. Handicap license is approved.    Follow-Up Instructions: No Follow-up on file.   Orders:  Orders Placed This Encounter  Procedures  . XR Knee 1-2 Views Right   No orders of the defined types were placed in this encounter.     Procedures: No procedures performed   Clinical Data: No additional findings.   Subjective: Chief Complaint  Patient presents with  . Right Knee - Follow-up    Ms. Patricia Sutton is here to follow up with her total right knee arthroplasty that was done 07/18/2013.  She states that she is doing great.  She is not havinfg issues with that but she is having issues with her lower feeling like there is water running down it.      Review of Systems  Constitutional: Negative.   HENT: Negative.   Eyes: Negative.   Respiratory: Negative.   Cardiovascular: Negative.   Gastrointestinal: Negative.   Endocrine: Negative.   Genitourinary: Negative.   Musculoskeletal: Negative.   Skin: Negative.   Allergic/Immunologic: Negative.   Neurological: Negative.   Hematological: Negative.   Psychiatric/Behavioral: Negative.      Objective: Vital Signs: BP 140/70 (BP Location: Left Arm, Patient Position: Sitting)    Pulse 63   Ht 5\' 3"  (1.6 m)   Wt 111 lb (50.3 kg)   BMI 19.66 kg/m   Physical Exam  Back Exam   Tenderness  The patient is experiencing tenderness in the lumbar.  Range of Motion  Extension: abnormal  Flexion: normal  Lateral Bend Right: normal  Lateral Bend Left: normal  Rotation Right: normal  Rotation Left: normal   Muscle Strength  Right Quadriceps:  5/5  Left Quadriceps:  5/5  Right Hamstrings:  5/5  Left Hamstrings:  5/5   Tests  Straight leg raise right: negative Straight leg raise left: negative  Reflexes  Patellar: 1/4 Achilles: 1/4 Babinski's sign: normal   Other  Toe Walk: normal Heel Walk: normal Sensation: normal Gait: normal  Erythema: no back redness Scars: absent      Specialty Comments:  No specialty comments available.  Imaging: Xr Knee 1-2 Views Right  Result Date: 08/09/2016 AP and lateral radiograph of the knees show right total knee replacement with no sign of loosening, no fracture, dislocation of subluxation. The left knee shows no sign of joint line narrowing, there is minimal spurring of the median eminences.     PMFS History: Patient Active Problem List   Diagnosis Date Noted  . Osteoarthritis of right knee 07/18/2013    Priority: High    Class:  Chronic  . Loose body of right knee 07/16/2012    Priority: High    Class: Chronic  . Tobacco use disorder 03/15/2015  . UTI (lower urinary tract infection) 03/15/2015  . COPD (chronic obstructive pulmonary disease) (HCC) 03/15/2015  . Hypothyroidism 03/15/2015  . Chronic pain syndrome 03/15/2015  . Dehydration 03/15/2015  . Elevated troponin 03/14/2015   Past Medical History:  Diagnosis Date  . Anxiety   . Arthritis   . Asthma   . Back pain, chronic   . Bone spur of other site    spine  . Chronic back pain    spurs on spine  . COPD (chronic obstructive pulmonary disease) (HCC)    denies SOB  . Depression    takes Effexor daily  . GERD (gastroesophageal reflux  disease)    takes Omeprazole daily  . History of bronchitis    last time about 6+yrs ago  . History of colon polyps   . History of kidney stones   . Hypothyroidism    takes Synthroid daily  . Insomnia    takes Trazodone nightly and Ambien  . Joint pain   . Joint swelling   . Nocturia   . Pneumonia    hx of;last time about 6+yrs ago  . PONV (postoperative nausea and vomiting)   . Renal insufficiency   . Sleep apnea    no CPAP; uses O2 at night 1l/min.  Marland Kitchen Urinary urgency     Family History  Problem Relation Age of Onset  . Heart attack Mother   . Stroke Mother   . Diabetes Mellitus II Mother   . Lymphoma Father     Past Surgical History:  Procedure Laterality Date  . ABDOMINAL HYSTERECTOMY     complete  . bladder tacked    . COLONOSCOPY    . ESOPHAGOGASTRODUODENOSCOPY    . KNEE ARTHROPLASTY Right 07/18/2013   Procedure: COMPUTER ASSISTED RIGHT TOTAL KNEE ARTHROPLASTY;  Surgeon: Kerrin Champagne, MD;  Location: [facility] OR;  Service: Orthopedics;  Laterality: Right;  . KNEE ARTHROSCOPY Right 07/16/2012   Procedure: ARTHROSCOPY KNEE;  Surgeon: Kerrin Champagne, MD;  Location: [facility];  Service: Orthopedics;  Laterality: Right;  Right knee arthroscopy with chondroplastic shaving of retropatella surface   Social History   Occupational History  . Not on file.   Social History Main Topics  . Smoking status: Current Every Day Smoker    Packs/day: 1.00    Years: 40.00    Types: Cigarettes  . Smokeless tobacco: Never Used  . Alcohol use No  . Drug use: No  . Sexual activity: Not on file

## 2016-08-31 ENCOUNTER — Ambulatory Visit (INDEPENDENT_AMBULATORY_CARE_PROVIDER_SITE_OTHER): Payer: Medicare Other | Admitting: Physical Medicine and Rehabilitation

## 2016-09-07 ENCOUNTER — Ambulatory Visit (INDEPENDENT_AMBULATORY_CARE_PROVIDER_SITE_OTHER): Payer: Medicare Other | Admitting: Physical Medicine and Rehabilitation

## 2016-09-22 ENCOUNTER — Emergency Department (HOSPITAL_BASED_OUTPATIENT_CLINIC_OR_DEPARTMENT_OTHER)
Admission: EM | Admit: 2016-09-22 | Discharge: 2016-09-22 | Disposition: A | Discharge status: Home / Self Care | Payer: Medicare Other | Attending: Emergency Medicine | Admitting: Emergency Medicine

## 2016-09-22 ENCOUNTER — Encounter (HOSPITAL_BASED_OUTPATIENT_CLINIC_OR_DEPARTMENT_OTHER): Payer: Self-pay | Admitting: *Deleted

## 2016-09-22 ENCOUNTER — Emergency Department (HOSPITAL_BASED_OUTPATIENT_CLINIC_OR_DEPARTMENT_OTHER): Payer: Medicare Other

## 2016-09-22 DIAGNOSIS — J45909 Unspecified asthma, uncomplicated: Secondary | ICD-10-CM | POA: Insufficient documentation

## 2016-09-22 DIAGNOSIS — J441 Chronic obstructive pulmonary disease with (acute) exacerbation: Secondary | ICD-10-CM | POA: Diagnosis not present

## 2016-09-22 DIAGNOSIS — Z7982 Long term (current) use of aspirin: Secondary | ICD-10-CM | POA: Diagnosis not present

## 2016-09-22 DIAGNOSIS — E039 Hypothyroidism, unspecified: Secondary | ICD-10-CM | POA: Diagnosis not present

## 2016-09-22 DIAGNOSIS — R05 Cough: Secondary | ICD-10-CM | POA: Diagnosis present

## 2016-09-22 DIAGNOSIS — F17210 Nicotine dependence, cigarettes, uncomplicated: Secondary | ICD-10-CM | POA: Diagnosis not present

## 2016-09-22 DIAGNOSIS — Z79899 Other long term (current) drug therapy: Secondary | ICD-10-CM | POA: Diagnosis not present

## 2016-09-22 DIAGNOSIS — R110 Nausea: Secondary | ICD-10-CM | POA: Insufficient documentation

## 2016-09-22 LAB — URINALYSIS, ROUTINE W REFLEX MICROSCOPIC
Bilirubin Urine: NEGATIVE
Glucose, UA: NEGATIVE mg/dL
Ketones, ur: NEGATIVE mg/dL
LEUKOCYTES UA: NEGATIVE
NITRITE: POSITIVE — AB
PH: 5.5 (ref 5.0–8.0)
Protein, ur: NEGATIVE mg/dL
Specific Gravity, Urine: 1.014 (ref 1.005–1.030)

## 2016-09-22 LAB — CBC WITH DIFFERENTIAL/PLATELET
BASOS PCT: 0 %
Basophils Absolute: 0 10*3/uL (ref 0.0–0.1)
EOS PCT: 0 %
Eosinophils Absolute: 0 10*3/uL (ref 0.0–0.7)
HCT: 38.9 % (ref 36.0–46.0)
Hemoglobin: 13.2 g/dL (ref 12.0–15.0)
LYMPHS ABS: 2.4 10*3/uL (ref 0.7–4.0)
Lymphocytes Relative: 13 %
MCH: 31.1 pg (ref 26.0–34.0)
MCHC: 33.9 g/dL (ref 30.0–36.0)
MCV: 91.5 fL (ref 78.0–100.0)
MONO ABS: 1.4 10*3/uL — AB (ref 0.1–1.0)
Monocytes Relative: 8 %
NEUTROS ABS: 14.3 10*3/uL — AB (ref 1.7–7.7)
Neutrophils Relative %: 79 %
PLATELETS: 279 10*3/uL (ref 150–400)
RBC: 4.25 MIL/uL (ref 3.87–5.11)
RDW: 14.7 % (ref 11.5–15.5)
WBC: 18.1 10*3/uL — ABNORMAL HIGH (ref 4.0–10.5)

## 2016-09-22 LAB — COMPREHENSIVE METABOLIC PANEL
ALT: 14 U/L (ref 14–54)
AST: 20 U/L (ref 15–41)
Albumin: 3.4 g/dL — ABNORMAL LOW (ref 3.5–5.0)
Alkaline Phosphatase: 82 U/L (ref 38–126)
Anion gap: 11 (ref 5–15)
BUN: 21 mg/dL — AB (ref 6–20)
CHLORIDE: 101 mmol/L (ref 101–111)
CO2: 24 mmol/L (ref 22–32)
Calcium: 9.1 mg/dL (ref 8.9–10.3)
Creatinine, Ser: 1.04 mg/dL — ABNORMAL HIGH (ref 0.44–1.00)
GFR, EST NON AFRICAN AMERICAN: 54 mL/min — AB (ref >60)
GLUCOSE: 161 mg/dL — AB (ref 65–99)
Potassium: 3.4 mmol/L — ABNORMAL LOW (ref 3.5–5.1)
Sodium: 136 mmol/L (ref 135–145)
Total Bilirubin: 0.4 mg/dL (ref 0.3–1.2)
Total Protein: 7.6 g/dL (ref 6.5–8.1)

## 2016-09-22 LAB — LIPASE, BLOOD: Lipase: 26 U/L (ref 11–51)

## 2016-09-22 LAB — URINALYSIS, MICROSCOPIC (REFLEX)

## 2016-09-22 LAB — I-STAT CG4 LACTIC ACID, ED: Lactic Acid, Venous: 2.09 mmol/L (ref 0.5–1.9)

## 2016-09-22 MED ORDER — METHYLPREDNISOLONE SODIUM SUCC 125 MG IJ SOLR
125.0000 mg | Freq: Once | INTRAMUSCULAR | Status: AC
  Administered 2016-09-22: 125 mg via INTRAVENOUS
  Filled 2016-09-22: qty 2

## 2016-09-22 MED ORDER — IPRATROPIUM-ALBUTEROL 0.5-2.5 (3) MG/3ML IN SOLN
3.0000 mL | Freq: Once | RESPIRATORY_TRACT | Status: AC
  Administered 2016-09-22: 3 mL via RESPIRATORY_TRACT
  Filled 2016-09-22: qty 3

## 2016-09-22 MED ORDER — PREDNISONE 10 MG PO TABS: 40.0000 mg | ORAL_TABLET | Freq: Every day | ORAL | 0 refills | Status: DC

## 2016-09-22 MED ORDER — HYDROMORPHONE HCL 1 MG/ML IJ SOLN
0.5000 mg | Freq: Once | INTRAMUSCULAR | Status: AC
Start: 2016-09-22 — End: 2016-09-22
  Administered 2016-09-22: 0.5 mg via INTRAVENOUS
  Filled 2016-09-22: qty 1

## 2016-09-22 MED ORDER — ONDANSETRON HCL 4 MG/2ML IJ SOLN
4.0000 mg | Freq: Once | INTRAMUSCULAR | Status: AC
  Administered 2016-09-22: 4 mg via INTRAVENOUS
  Filled 2016-09-22: qty 2

## 2016-09-22 MED ORDER — IPRATROPIUM-ALBUTEROL 0.5-2.5 (3) MG/3ML IN SOLN
3.0000 mL | Freq: Four times a day (QID) | RESPIRATORY_TRACT | Status: DC
  Administered 2016-09-22: 3 mL via RESPIRATORY_TRACT
  Filled 2016-09-22: qty 3

## 2016-09-22 MED ORDER — SODIUM CHLORIDE 0.9 % IV SOLN
INTRAVENOUS | Status: DC
  Administered 2016-09-22: 17:00:00 via INTRAVENOUS

## 2016-09-22 MED ORDER — SODIUM CHLORIDE 0.9 % IV BOLUS (SEPSIS)
250.0000 mL | Freq: Once | INTRAVENOUS | Status: AC
  Administered 2016-09-22: 250 mL via INTRAVENOUS

## 2016-09-22 NOTE — Discharge Instructions (Signed)
Start the prednisone tomorrow. Take as directed. Use your albuterol inhaler 2 puffs every 6 hours. Return for any new or worse symptoms like fevers or feeling worse at all.

## 2016-09-22 NOTE — ED Notes (Signed)
Spoke with Dr. Deretha Emory about treatment plan. Pt is to be monitored here in ED to make sure Neb treatments are being effective. Explained this treatment plan with pt and family member and they verbalized understanding. RT at bedside now to evaluate pt.

## 2016-09-22 NOTE — ED Notes (Signed)
Patient transported to X-ray 

## 2016-09-22 NOTE — ED Notes (Signed)
ED Provider at bedside. 

## 2016-09-22 NOTE — ED Notes (Signed)
Patient is A & O x4.  She understood AVS instructions.  No questions asked.

## 2016-09-22 NOTE — ED Notes (Signed)
Placed on cardiac monitor, BP & SpO2, with VS q 30.

## 2016-09-22 NOTE — ED Notes (Signed)
Removed patients' nasal cannula in order to assess her requirements and need for O2. Will re-assess in 10 minutes

## 2016-09-22 NOTE — ED Provider Notes (Signed)
[facility] Provider Note   CSN: [id number] Arrival date & time: 09/22/16  1600     History   Chief Complaint Chief Complaint  Patient presents with  . Cough    HPI Patricia Sutton is a 71 y.o. female.  Patient has a history of COPD. Patient reports cough nausea chills no fever no vomiting or diarrhea since Monday. Patient has reported shortness of breath and wheezing. Patient is using her albuterol inhaler home. Patient denies being on prednisone. Patient has oxygen available at home uses as needed. Usually uses it at night. Patient has not been using oxygen today.      Past Medical History:  Diagnosis Date  . Anxiety   . Arthritis   . Asthma   . Back pain, chronic   . Bone spur of other site    spine  . Chronic back pain    spurs on spine  . COPD (chronic obstructive pulmonary disease) (HCC)    denies SOB  . Depression    takes Effexor daily  . GERD (gastroesophageal reflux disease)    takes Omeprazole daily  . History of bronchitis    last time about 6+yrs ago  . History of colon polyps   . History of kidney stones   . Hypothyroidism    takes Synthroid daily  . Insomnia    takes Trazodone nightly and Ambien  . Joint pain   . Joint swelling   . Nocturia   . Pneumonia    hx of;last time about 6+yrs ago  . PONV (postoperative nausea and vomiting)   . Renal insufficiency   . Sleep apnea    no CPAP; uses O2 at night 1l/min.  Marland Kitchen Urinary urgency     Patient Active Problem List   Diagnosis Date Noted  . Tobacco use disorder 03/15/2015  . UTI (lower urinary tract infection) 03/15/2015  . COPD (chronic obstructive pulmonary disease) (HCC) 03/15/2015  . Hypothyroidism 03/15/2015  . Chronic pain syndrome 03/15/2015  . Dehydration 03/15/2015  . Elevated troponin 03/14/2015  . Osteoarthritis of right knee 07/18/2013    Class: Chronic  . Loose body of right knee 07/16/2012    Class: Chronic    Past Surgical History:  Procedure Laterality Date   . ABDOMINAL HYSTERECTOMY     complete  . bladder tacked    . COLONOSCOPY    . ESOPHAGOGASTRODUODENOSCOPY    . KNEE ARTHROPLASTY Right 07/18/2013   Procedure: COMPUTER ASSISTED RIGHT TOTAL KNEE ARTHROPLASTY;  Surgeon: Kerrin Champagne, MD;  Location: [facility];  Service: Orthopedics;  Laterality: Right;  . KNEE ARTHROSCOPY Right 07/16/2012   Procedure: ARTHROSCOPY KNEE;  Surgeon: Kerrin Champagne, MD;  Location: [facility];  Service: Orthopedics;  Laterality: Right;  Right knee arthroscopy with chondroplastic shaving of retropatella surface    OB History    No data available       Home Medications    Prior to Admission medications   Medication Sig Start Date End Date Taking? Authorizing Provider  albuterol (PROVENTIL) (2.5 MG/3ML) 0.083% nebulizer solution Take 2.5 mg by nebulization every 4 (four) hours as needed for wheezing or shortness of breath.   Yes Historical Provider, MD  aspirin EC 81 MG tablet Take 1 tablet (81 mg total) by mouth daily. 03/16/15  Yes Catarina Hartshorn, MD  budesonide-formoterol (SYMBICORT) 160-4.5 MCG/ACT inhaler Inhale 2 puffs into the lungs 2 (two) times daily.   Yes Historical Provider, MD  docusate sodium (COLACE) 100 MG capsule  Take 1 capsule (100 mg total) by mouth 2 (two) times daily. 03/16/15  Yes Catarina Hartshorn, MD  levothyroxine (SYNTHROID, LEVOTHROID) 25 MCG tablet Take 25 mcg by mouth daily before breakfast.   Yes Historical Provider, MD  lidocaine (XYLOCAINE) 2 % jelly Apply 1 application topically 2 (two) times daily as needed (pain). Apply 1/4 inch to knee as needed   Yes Historical Provider, MD  lubiprostone (AMITIZA) 24 MCG capsule Take 24 mcg by mouth 2 (two) times daily.   Yes Historical Provider, MD  omeprazole (PRILOSEC) 20 MG capsule Take 20 mg by mouth 2 (two) times daily.   Yes Historical Provider, MD  oxyCODONE-acetaminophen (PERCOCET) 10-325 MG tablet Take 1 tablet by mouth every 4 (four) hours as needed for pain.   Yes Historical Provider, MD   Pregabalin (LYRICA PO) Take by mouth.   Yes Historical Provider, MD  promethazine (PHENERGAN) 25 MG tablet Take 25 mg by mouth 3 (three) times daily as needed for nausea or vomiting.   Yes Historical Provider, MD  traZODone (DESYREL) 100 MG tablet Take 100 mg by mouth at bedtime.    Yes Historical Provider, MD  UNKNOWN TO PATIENT    Yes Historical Provider, MD  UNKNOWN TO PATIENT    Yes Historical Provider, MD  VOLTAREN 1 % GEL APPLY 2 TO 4 GRAMS TO THE AFFECTED AREA 2 TO 3 TIMES DAILY 03/22/16  Yes Kerrin Champagne, MD  cephALEXin (KEFLEX) 500 MG capsule Take 1 capsule (500 mg total) by mouth 3 (three) times daily. 01/17/16   Azalia Bilis, MD  ondansetron (ZOFRAN ODT) 8 MG disintegrating tablet Take 1 tablet (8 mg total) by mouth every 8 (eight) hours as needed for nausea or vomiting. 01/17/16   Azalia Bilis, MD  predniSONE (DELTASONE) 10 MG tablet Take 4 tablets (40 mg total) by mouth daily. 09/22/16   Vanetta Mulders, MD    Family History Family History  Problem Relation Age of Onset  . Heart attack Mother   . Stroke Mother   . Diabetes Mellitus II Mother   . Lymphoma Father     Social History Social History  Substance Use Topics  . Smoking status: Current Some Day Smoker    Packs/day: 1.00    Years: 40.00    Types: Cigarettes  . Smokeless tobacco: Never Used  . Alcohol use No     Allergies   Patient has no known allergies.   Review of Systems Review of Systems  Constitutional: Positive for chills. Negative for fever.  HENT: Negative for congestion.   Eyes: Negative for redness.  Respiratory: Positive for cough, shortness of breath and wheezing.   Gastrointestinal: Positive for nausea. Negative for diarrhea and vomiting.  Genitourinary: Negative for dysuria.  Musculoskeletal: Negative for myalgias.  Skin: Negative for rash.  Neurological: Negative for headaches.  Hematological: Does not bruise/bleed easily.  Psychiatric/Behavioral: Negative for confusion.      Physical Exam Updated Vital Signs BP 133/69   Pulse 69   Temp 98.5 F (36.9 C) (Oral)   Resp 17   Ht  (1.6 m)   Wt 52.2 kg   SpO2 95%   BMI 20.37 kg/m   Physical Exam  Constitutional: She is oriented to person, place, and time. She appears well-developed and well-nourished. No distress.  HENT:  Head: Normocephalic and atraumatic.  Mouth/Throat: Oropharynx is clear and moist.  Eyes: EOM are normal. Pupils are equal, round, and reactive to light.  Neck: Normal range of motion. Neck supple.  Cardiovascular: Normal rate and regular rhythm.   Pulmonary/Chest: Effort normal. She has wheezes.  Abdominal: Soft. Bowel sounds are normal. There is no tenderness.  Musculoskeletal: Normal range of motion. She exhibits no edema.  Neurological: She is alert and oriented to person, place, and time. No cranial nerve deficit or sensory deficit. She exhibits normal muscle tone. Coordination normal.  Skin: Skin is warm. No rash noted.  Nursing note and vitals reviewed.    ED Treatments / Results  Labs (all labs ordered are listed, but only abnormal results are displayed) Labs Reviewed  CBC WITH DIFFERENTIAL/PLATELET - Abnormal; Notable for the following:       Result Value   WBC 18.1 (*)    Neutro Abs 14.3 (*)    Monocytes Absolute 1.4 (*)    All other components within normal limits  COMPREHENSIVE METABOLIC PANEL - Abnormal; Notable for the following:    Potassium 3.4 (*)    Glucose, Bld 161 (*)    BUN 21 (*)    Creatinine, Ser 1.04 (*)    Albumin 3.4 (*)    GFR calc non Af Amer 54 (*)    All other components within normal limits  URINALYSIS, ROUTINE W REFLEX MICROSCOPIC - Abnormal; Notable for the following:    Hgb urine dipstick MODERATE (*)    Nitrite POSITIVE (*)    All other components within normal limits  URINALYSIS, MICROSCOPIC (REFLEX) - Abnormal; Notable for the following:    Bacteria, UA FEW (*)    Squamous Epithelial / LPF 0-5 (*)    All other components  within normal limits  I-STAT CG4 LACTIC ACID, ED - Abnormal; Notable for the following:    Lactic Acid, Venous 2.09 (*)    All other components within normal limits  LIPASE, BLOOD  I-STAT CG4 LACTIC ACID, ED    EKG  EKG Interpretation None       Radiology Dg Chest 2 View  Result Date: 09/22/2016 CLINICAL DATA:  Cough, fever, COPD EXAM: CHEST  2 VIEW COMPARISON:  03/14/2015 FINDINGS: Stable hyperinflation. Normal heart size and vascularity. No focal pneumonia, collapse or consolidation. Negative for edema, effusion or pneumothorax. Trachea is midline. Atherosclerosis of the aorta. Diffuse thoracic spondylosis. No significant interval change. IMPRESSION: Stable COPD/emphysema.  No superimposed acute process. Thoracic aortic atherosclerosis Electronically Signed   By: Judie Petit.  Shick M.D.   On: 09/22/2016 17:25    Procedures Procedures (including critical care time)  Medications Ordered in ED Medications  0.9 %  sodium chloride infusion ( Intravenous New Bag/Given 09/22/16 1725)  ipratropium-albuterol (DUONEB) 0.5-2.5 (3) MG/3ML nebulizer solution 3 mL (3 mLs Nebulization Given 09/22/16 1756)  ipratropium-albuterol (DUONEB) 0.5-2.5 (3) MG/3ML nebulizer solution 3 mL (3 mLs Nebulization Given 09/22/16 1622)  ondansetron (ZOFRAN) injection 4 mg (4 mg Intravenous Given 09/22/16 1708)  sodium chloride 0.9 % bolus 250 mL (0 mLs Intravenous Stopped 09/22/16 1725)  HYDROmorphone (DILAUDID) injection 0.5 mg (0.5 mg Intravenous Given 09/22/16 1709)  methylPREDNISolone sodium succinate (SOLU-MEDROL) 125 mg/2 mL injection 125 mg (125 mg Intravenous Given 09/22/16 2019)     Initial Impression / Assessment and Plan / ED Course  I have reviewed the triage vital signs and the nursing notes.  Pertinent labs & imaging results that were available during my care of the patient were reviewed by me and considered in my medical decision making (see chart for details).     Patient with a history of COPD. Patient  came in with cough nausea chills since Monday no  vomiting or diarrhea. No fever here and blood pressure has been normal no significant tachycardia.  Patient dove lungs were initially very tight without any wheezing but after second nebulizer opened up and there was some wheezing. Wheezing is now resolved. Temperature was rechecked this patient felt warm but no fever. Patient also received Solu Medrol.  Patient feels much better. Wheezing has resolved. Patient's oxygen saturations on room air 94%. Patient does have oxygen available at home. Does use it at night. Patient currently very nontoxic no acute distress. Patient wants to go home.  However did discuss with patient there was too labs of concern. One was elevated white blood cell count of 18,000 and a lactic acid slightly above normal in the low twos. Patient's vital signs not consistent with a septic picture. Patient given precautions and will discharge home on a 5 day course of prednisone and have her continue her albuterol inhaler. Patient states that she'll return for any new or worse symptoms.  Final Clinical Impressions(s) / ED Diagnoses   Final diagnoses:  COPD with acute exacerbation (HCC)    New Prescriptions New Prescriptions   PREDNISONE (DELTASONE) 10 MG TABLET    Take 4 tablets (40 mg total) by mouth daily.     Vanetta Mulders, MD 09/22/16 [phone]

## 2016-09-22 NOTE — ED Triage Notes (Signed)
Pt reports cough, nausea, chills since Monday. Denies v/d.

## 2016-09-28 DIAGNOSIS — F17210 Nicotine dependence, cigarettes, uncomplicated: Secondary | ICD-10-CM | POA: Insufficient documentation

## 2016-09-28 DIAGNOSIS — G4734 Idiopathic sleep related nonobstructive alveolar hypoventilation: Secondary | ICD-10-CM | POA: Insufficient documentation

## 2016-10-12 ENCOUNTER — Other Ambulatory Visit (INDEPENDENT_AMBULATORY_CARE_PROVIDER_SITE_OTHER): Payer: Self-pay | Admitting: Specialist

## 2016-10-12 NOTE — Telephone Encounter (Signed)
meloxicam refill request 

## 2016-10-13 ENCOUNTER — Other Ambulatory Visit (INDEPENDENT_AMBULATORY_CARE_PROVIDER_SITE_OTHER): Payer: Self-pay | Admitting: Specialist

## 2016-10-13 NOTE — Telephone Encounter (Signed)
Patient called needing Rx refilled (Voltaren cream) The number to contact patient is [phone]

## 2016-10-16 MED ORDER — VOLTAREN 1 % TD GEL: TRANSDERMAL | 11 refills | Status: DC

## 2016-10-16 NOTE — Telephone Encounter (Signed)
Rx for transdermal voltaren gel approved. 11 refills.

## 2016-11-15 ENCOUNTER — Other Ambulatory Visit (INDEPENDENT_AMBULATORY_CARE_PROVIDER_SITE_OTHER): Payer: Self-pay | Admitting: Specialist

## 2016-11-15 NOTE — Telephone Encounter (Signed)
meloxicam refill request 

## 2016-12-12 ENCOUNTER — Encounter (HOSPITAL_BASED_OUTPATIENT_CLINIC_OR_DEPARTMENT_OTHER): Payer: Self-pay | Admitting: *Deleted

## 2016-12-12 ENCOUNTER — Emergency Department (HOSPITAL_BASED_OUTPATIENT_CLINIC_OR_DEPARTMENT_OTHER)
Admission: EM | Admit: 2016-12-12 | Discharge: 2016-12-12 | Disposition: A | Discharge status: Home / Self Care | Payer: Medicare Other | Attending: Emergency Medicine | Admitting: Emergency Medicine

## 2016-12-12 DIAGNOSIS — J449 Chronic obstructive pulmonary disease, unspecified: Secondary | ICD-10-CM | POA: Diagnosis not present

## 2016-12-12 DIAGNOSIS — F17210 Nicotine dependence, cigarettes, uncomplicated: Secondary | ICD-10-CM | POA: Diagnosis not present

## 2016-12-12 DIAGNOSIS — E039 Hypothyroidism, unspecified: Secondary | ICD-10-CM | POA: Diagnosis not present

## 2016-12-12 DIAGNOSIS — Z96651 Presence of right artificial knee joint: Secondary | ICD-10-CM | POA: Diagnosis not present

## 2016-12-12 DIAGNOSIS — Z79899 Other long term (current) drug therapy: Secondary | ICD-10-CM | POA: Insufficient documentation

## 2016-12-12 DIAGNOSIS — M5441 Lumbago with sciatica, right side: Secondary | ICD-10-CM | POA: Diagnosis not present

## 2016-12-12 DIAGNOSIS — Z7982 Long term (current) use of aspirin: Secondary | ICD-10-CM | POA: Diagnosis not present

## 2016-12-12 DIAGNOSIS — M545 Low back pain: Secondary | ICD-10-CM | POA: Diagnosis present

## 2016-12-12 DIAGNOSIS — J45909 Unspecified asthma, uncomplicated: Secondary | ICD-10-CM | POA: Insufficient documentation

## 2016-12-12 DIAGNOSIS — M5442 Lumbago with sciatica, left side: Secondary | ICD-10-CM | POA: Diagnosis not present

## 2016-12-12 DIAGNOSIS — G8929 Other chronic pain: Secondary | ICD-10-CM | POA: Diagnosis not present

## 2016-12-12 MED ORDER — METHOCARBAMOL 500 MG PO TABS: 500.0000 mg | ORAL_TABLET | Freq: Three times a day (TID) | ORAL | 0 refills | Status: DC | PRN

## 2016-12-12 MED ORDER — HYDROMORPHONE HCL 1 MG/ML IJ SOLN
2.0000 mg | Freq: Once | INTRAMUSCULAR | Status: AC
  Administered 2016-12-12: 2 mg via INTRAMUSCULAR
  Filled 2016-12-12: qty 2

## 2016-12-12 MED ORDER — ONDANSETRON 4 MG PO TBDP
4.0000 mg | ORAL_TABLET | Freq: Once | ORAL | Status: AC | PRN
Start: 2016-12-12 — End: 2016-12-12
  Administered 2016-12-12: 4 mg via ORAL
  Filled 2016-12-12: qty 1

## 2016-12-12 NOTE — ED Provider Notes (Signed)
[facility]   CSN: [id number] Arrival date & time: 12/12/16  2107  By signing my name below, I, [name], attest that this documentation has been prepared under the direction and in the presence of Pricilla Loveless, MD. Electronically Signed: [name], ED Scribe. 12/12/16. 9:59 PM.  History   Chief Complaint Chief Complaint  Patient presents with  . Back Pain   The history is provided by the patient. No language interpreter was used.   HPI Comments: Patricia Sutton is a 70 y.o. female who presents to the Emergency Department complaining of worsening, chronic mid-lower back pain onset 1 month ago. Pt has had chronic back pain for 6 years and states her pain is similar. No trauma recently. Pt describes pain as sharp and radiating down her legs to her calves. Pt has tried Ibuprofen, meloxicam and lyrica with no relief. Pt states she is in the process of switching pain specialists to be closer to home.  Has not had hydrocodone in over 1 month.  No trauma. Pt denies fever, abdominal pain, dysuria, hematuria, weakness, urinary/bowel incontinence or any other complaints at this time.  Past Medical History:  Diagnosis Date  . Anxiety   . Arthritis   . Asthma   . Back pain, chronic   . Bone spur of other site    spine  . Chronic back pain    spurs on spine  . COPD (chronic obstructive pulmonary disease) (HCC)    denies SOB  . Depression    takes Effexor daily  . GERD (gastroesophageal reflux disease)    takes Omeprazole daily  . History of bronchitis    last time about 6+yrs ago  . History of colon polyps   . History of kidney stones   . Hypothyroidism    takes Synthroid daily  . Insomnia    takes Trazodone nightly and Ambien  . Joint pain   . Joint swelling   . Nocturia   . Pneumonia    hx of;last time about 6+yrs ago  . PONV (postoperative nausea and vomiting)   . Renal insufficiency   . Sleep apnea    no CPAP; uses O2 at night 1l/min.   Marland Kitchen Urinary urgency     Patient Active Problem List   Diagnosis Date Noted  . Tobacco use disorder 03/15/2015  . UTI (lower urinary tract infection) 03/15/2015  . COPD (chronic obstructive pulmonary disease) (HCC) 03/15/2015  . Hypothyroidism 03/15/2015  . Chronic pain syndrome 03/15/2015  . Dehydration 03/15/2015  . Elevated troponin 03/14/2015  . Osteoarthritis of right knee 07/18/2013    Class: Chronic  . Loose body of right knee 07/16/2012    Class: Chronic    Past Surgical History:  Procedure Laterality Date  . ABDOMINAL HYSTERECTOMY     complete  . bladder tacked    . COLONOSCOPY    . ESOPHAGOGASTRODUODENOSCOPY    . KNEE ARTHROPLASTY Right 07/18/2013   Procedure: COMPUTER ASSISTED RIGHT TOTAL KNEE ARTHROPLASTY;  Surgeon: Kerrin Champagne, MD;  Location: [facility] OR;  Service: Orthopedics;  Laterality: Right;  . KNEE ARTHROSCOPY Right 07/16/2012   Procedure: ARTHROSCOPY KNEE;  Surgeon: Kerrin Champagne, MD;  Location: [facility];  Service: Orthopedics;  Laterality: Right;  Right knee arthroscopy with chondroplastic shaving of retropatella surface    OB History    No data available       Home Medications    Prior to Admission medications   Medication Sig Start Date  End Date Taking? Authorizing Provider  albuterol (PROVENTIL) (2.5 MG/3ML) 0.083% nebulizer solution Take 2.5 mg by nebulization every 4 (four) hours as needed for wheezing or shortness of breath.    [provider]  aspirin EC 81 MG tablet Take 1 tablet (81 mg total) by mouth daily. 03/16/15   Catarina Hartshornat, David, MD  budesonide-formoterol (SYMBICORT) 160-4.5 MCG/ACT inhaler Inhale 2 puffs into the lungs 2 (two) times daily.    [provider]  cephALEXin (KEFLEX) 500 MG capsule Take 1 capsule (500 mg total) by mouth 3 (three) times daily. 01/17/16   Azalia Bilisampos, Kevin, MD  docusate sodium (COLACE) 100 MG capsule Take 1 capsule (100 mg total) by mouth 2 (two) times daily. 03/16/15   Catarina Hartshornat, David, MD    levothyroxine (SYNTHROID, LEVOTHROID) 25 MCG tablet Take 25 mcg by mouth daily before breakfast.    [provider]  lidocaine (XYLOCAINE) 2 % jelly Apply 1 application topically 2 (two) times daily as needed (pain). Apply 1/4 inch to knee as needed    [provider]  lubiprostone (AMITIZA) 24 MCG capsule Take 24 mcg by mouth 2 (two) times daily.    [provider]  meloxicam (MOBIC) 15 MG tablet TAKE 1 TABLET BY MOUTH EVERY MORNING WITH FOOD 11/15/16   Kerrin ChampagneNitka, James E, MD  omeprazole (PRILOSEC) 20 MG capsule Take 20 mg by mouth 2 (two) times daily.    [provider]  ondansetron (ZOFRAN ODT) 8 MG disintegrating tablet Take 1 tablet (8 mg total) by mouth every 8 (eight) hours as needed for nausea or vomiting. 01/17/16   Azalia Bilisampos, Kevin, MD  oxyCODONE-acetaminophen (PERCOCET) 10-325 MG tablet Take 1 tablet by mouth every 4 (four) hours as needed for pain.    [provider]  predniSONE (DELTASONE) 10 MG tablet Take 4 tablets (40 mg total) by mouth daily. 09/22/16   Vanetta MuldersZackowski, Shanell Aden, MD  Pregabalin (LYRICA PO) Take by mouth.    [provider]  promethazine (PHENERGAN) 25 MG tablet Take 25 mg by mouth 3 (three) times daily as needed for nausea or vomiting.    [provider]  traZODone (DESYREL) 100 MG tablet Take 100 mg by mouth at bedtime.     [provider]  UNKNOWN TO PATIENT     [provider]  UNKNOWN TO PATIENT     [provider]  VOLTAREN 1 % GEL APPLY 2 TO 4 GRAMS TO THE AFFECTED AREA 2 TO 3 TIMES DAILY 10/16/16   Kerrin ChampagneNitka, James E, MD    Family History Family History  Problem Relation Age of Onset  . Heart attack Mother   . Stroke Mother   . Diabetes Mellitus II Mother   . Lymphoma Father     Social History Social History  Substance Use Topics  . Smoking status: Current Some Day Smoker    Packs/day: 1.00    Years: 40.00    Types: Cigarettes  . Smokeless tobacco: Never Used  . Alcohol use No      Allergies   Patient has no known allergies.   Review of Systems Review of Systems  Constitutional: Negative for fever.  Gastrointestinal: Negative for abdominal pain.  Genitourinary: Negative for dysuria, enuresis and hematuria.  Musculoskeletal: Positive for back pain.  Neurological: Negative for weakness.  All other systems reviewed and are negative.    Physical Exam Updated Vital Signs BP (!) 177/65 (BP Location: Right Arm) Comment: triage RN aware taken twice  Pulse 62   Temp 98 F (  36.7 C) (Oral)   Resp (!) 21   Ht 5\' 3"  (1.6 m)   Wt 118 lb (53.5 kg)   SpO2 100%   BMI 20.90 kg/m   Physical Exam  Constitutional: She is oriented to person, place, and time. She appears well-developed and well-nourished.  HENT:  Head: Normocephalic and atraumatic.  Right Ear: External ear normal.  Left Ear: External ear normal.  Nose: Nose normal.  Eyes: Right eye exhibits no discharge. Left eye exhibits no discharge.  Cardiovascular: Normal rate, regular rhythm and normal heart sounds.   Pulmonary/Chest: Effort normal and breath sounds normal.  Abdominal: Soft. She exhibits no distension. There is no tenderness.  Musculoskeletal:  No bony thoracic or lumbar tenderness. No CVA tenderness. No significant soft tissue tenderness.   Neurological: She is alert and oriented to person, place, and time. She displays normal reflexes.  5/5 strenth in bilateral lower extremities. Normal gross sensation. 2+ patellar and achilles reflexes bilaterally. Down going babinski.   Skin: Skin is warm and dry.  Nursing note and vitals reviewed.    ED Treatments / Results  DIAGNOSTIC STUDIES: Oxygen Saturation is 100% on RA, normal by my interpretation.   COORDINATION OF CARE: 9:58 PM-Discussed next steps with pt including pain management. Pt verbalized understanding and is agreeable with the plan.   Labs (all labs ordered are listed, but only abnormal results are displayed) Labs Reviewed  - No data to display  EKG  EKG Interpretation None       Radiology No results found.  Procedures Procedures (including critical care time)  EMERGENCY DEPARTMENT ULTRASOUND  Study: Limited Retroperitoneal Ultrasound of the Abdominal Aorta.  INDICATIONS:Back pain and Age>55 Multiple views of the abdominal aorta were obtained in real-time from the diaphragmatic hiatus to the aortic bifurcation in transverse planes with a multi-frequency probe.  PERFORMED BY: Myself IMAGES ARCHIVED?: Yes LIMITATIONS:  None INTERPRETATION:  No abdominal aortic aneurysm    Medications Ordered in ED Medications  HYDROmorphone (DILAUDID) injection 2 mg (not administered)  ondansetron (ZOFRAN-ODT) disintegrating tablet 4 mg (4 mg Oral Given 12/12/16 2118)     Initial Impression / Assessment and Plan / ED Course  I have reviewed the triage vital signs and the nursing notes.  Pertinent labs & imaging results that were available during my care of the patient were reviewed by me and considered in my medical decision making (see chart for details).     Patient presents with acute on chronic back pain. No concerning signs or symptoms such as abdominal pain, neurologic complaints, or incontinence. She is afebrile. Her urinary symptoms. She had a urinalysis obtained by PCP earlier in the day that showed small blood but otherwise no UTI. Her symptoms are bilateral and consistent with her prior chronic back pain and so do not think emergent imaging is needed. I doubt renal colic, spinal emergency, or AAA. However given her age with back pain I did do a bedside ultrasound which shows normal sized aorta without aneurysm. She was given IM Dilaudid with good relief. She will be discharged home, I will add a muscle relaxer but I will ask her to continue NSAIDs and Tylenol and follow-up with her PCP and/or pain specialist. Given this is a chronic issue I discussed I will not be prescribing narcotics at this time.  Discussed return precautions.  Final Clinical Impressions(s) / ED Diagnoses   Final diagnoses:  Chronic bilateral low back pain with bilateral sciatica    New Prescriptions New Prescriptions   No  medications on file   I personally performed the services described in this documentation, which was scribed in my presence. The recorded information has been reviewed and is accurate.     Pricilla Loveless, MD 12/12/16 [phone]

## 2016-12-12 NOTE — ED Triage Notes (Addendum)
Chronic back pain. Her last epidural at the pain clinic was in April.  States she can't go back to the pain clinic for an epidural pain shot until she gets medicaid approval. Nausea.

## 2016-12-12 NOTE — ED Notes (Signed)
Pt needed to urinate. Specimen obtained and taken to the lab.UA was not ordered until pt is examined but to lack of urinary complaint.

## 2016-12-12 NOTE — ED Notes (Signed)
ED Provider at bedside. 

## 2016-12-13 MED FILL — METHOCARBAMOL 500 MG TABLET: 500 | 5 days supply | Qty: 15 | Fill #0

## 2016-12-16 ENCOUNTER — Encounter (HOSPITAL_BASED_OUTPATIENT_CLINIC_OR_DEPARTMENT_OTHER): Payer: Self-pay

## 2016-12-16 ENCOUNTER — Emergency Department (HOSPITAL_BASED_OUTPATIENT_CLINIC_OR_DEPARTMENT_OTHER)
Admission: EM | Admit: 2016-12-16 | Discharge: 2016-12-17 | Disposition: A | Discharge status: Home / Self Care | Payer: Medicare Other | Attending: Emergency Medicine | Admitting: Emergency Medicine

## 2016-12-16 DIAGNOSIS — E039 Hypothyroidism, unspecified: Secondary | ICD-10-CM | POA: Insufficient documentation

## 2016-12-16 DIAGNOSIS — M5442 Lumbago with sciatica, left side: Secondary | ICD-10-CM | POA: Diagnosis not present

## 2016-12-16 DIAGNOSIS — F17210 Nicotine dependence, cigarettes, uncomplicated: Secondary | ICD-10-CM | POA: Diagnosis not present

## 2016-12-16 DIAGNOSIS — M545 Low back pain: Secondary | ICD-10-CM | POA: Diagnosis present

## 2016-12-16 DIAGNOSIS — Z96651 Presence of right artificial knee joint: Secondary | ICD-10-CM | POA: Insufficient documentation

## 2016-12-16 DIAGNOSIS — J449 Chronic obstructive pulmonary disease, unspecified: Secondary | ICD-10-CM | POA: Diagnosis not present

## 2016-12-16 DIAGNOSIS — G8929 Other chronic pain: Secondary | ICD-10-CM

## 2016-12-16 DIAGNOSIS — M5441 Lumbago with sciatica, right side: Secondary | ICD-10-CM | POA: Insufficient documentation

## 2016-12-16 DIAGNOSIS — J45909 Unspecified asthma, uncomplicated: Secondary | ICD-10-CM | POA: Insufficient documentation

## 2016-12-16 DIAGNOSIS — Z7982 Long term (current) use of aspirin: Secondary | ICD-10-CM | POA: Diagnosis not present

## 2016-12-16 DIAGNOSIS — Z79899 Other long term (current) drug therapy: Secondary | ICD-10-CM | POA: Insufficient documentation

## 2016-12-16 MED ORDER — ONDANSETRON 8 MG PO TBDP
8.0000 mg | ORAL_TABLET | Freq: Once | ORAL | Status: AC
  Administered 2016-12-17: 8 mg via ORAL
  Filled 2016-12-16: qty 1

## 2016-12-16 MED ORDER — METHOCARBAMOL 500 MG PO TABS: 500.0000 mg | ORAL_TABLET | Freq: Three times a day (TID) | ORAL | 0 refills | Status: DC | PRN

## 2016-12-16 MED ORDER — HYDROMORPHONE HCL 1 MG/ML IJ SOLN
2.0000 mg | Freq: Once | INTRAMUSCULAR | Status: AC
  Administered 2016-12-17: 2 mg via INTRAMUSCULAR
  Filled 2016-12-16: qty 2

## 2016-12-16 NOTE — ED Triage Notes (Signed)
Pt c/o chronic back pain that radiates down into both legs. Pt states she was seen here on Tues (7/17) and is scheduled to have an appoint with her PCP next Thursday, but the pain has become severe today.

## 2016-12-16 NOTE — ED Provider Notes (Signed)
[facility] Provider Note: Patricia DellJ. Lane [name], MD, FACEP  CSN: [id number] MRN: [id number] ARRIVAL: 12/16/16 at 2016 ROOM: MH08/MH08   CHIEF COMPLAINT  Back Pain   HISTORY OF PRESENT ILLNESS  Patricia Sutton is a 71 y.o. female with a history of chronic back pain previously on oxycodone/APAP 10/325, 120 tablets monthly. Her last prescription was 09/01/2016. She continues to be on Lyrica. She has experienced an exacerbation of her chronic pain this week. She was seen in the ED on the 17th of this month and was given a shot of Dilaudid. She was told that because she is a chronic pain patient she would not be written a prescription for narcotics. She was given a prescription for Robaxin. She states she experienced good relief of her pain following the injection until earlier this evening. Her pain is now a 10 out of 10, described as aching. It is located in her bilateral lower back, right greater than left, and radiates down the back of both legs. It is worse with attempted ambulation. She requires a cane to get around. There is no new numbness or weakness reported. She has an appointment with her pain management doctor in 5 days.   Past Medical History:  Diagnosis Date  . Anxiety   . Arthritis   . Asthma   . Back pain, chronic   . Bone spur of other site    spine  . Chronic back pain    spurs on spine  . COPD (chronic obstructive pulmonary disease) (HCC)    denies SOB  . Depression    takes Effexor daily  . GERD (gastroesophageal reflux disease)    takes Omeprazole daily  . History of bronchitis    last time about 6+yrs ago  . History of colon polyps   . History of kidney stones   . Hypothyroidism    takes Synthroid daily  . Insomnia    takes Trazodone nightly and Ambien  . Joint pain   . Joint swelling   . Nocturia   . Pneumonia    hx of;last time about 6+yrs ago  . PONV (postoperative nausea and vomiting)   . Renal insufficiency   . Sleep apnea    no CPAP; uses  O2 at night 1l/min.  Marland Kitchen. Urinary urgency     Past Surgical History:  Procedure Laterality Date  . ABDOMINAL HYSTERECTOMY     complete  . bladder tacked    . COLONOSCOPY    . ESOPHAGOGASTRODUODENOSCOPY    . KNEE ARTHROPLASTY Right 07/18/2013   Procedure: COMPUTER ASSISTED RIGHT TOTAL KNEE ARTHROPLASTY;  Surgeon: Kerrin ChampagneJames E Nitka, MD;  Location: [facility] OR;  Service: Orthopedics;  Laterality: Right;  . KNEE ARTHROSCOPY Right 07/16/2012   Procedure: ARTHROSCOPY KNEE;  Surgeon: Kerrin ChampagneJames E Nitka, MD;  Location: [facility];  Service: Orthopedics;  Laterality: Right;  Right knee arthroscopy with chondroplastic shaving of retropatella surface    Family History  Problem Relation Age of Onset  . Heart attack Mother   . Stroke Mother   . Diabetes Mellitus II Mother   . Lymphoma Father     Social History  Substance Use Topics  . Smoking status: Current Some Day Smoker    Packs/day: 1.00    Years: 40.00    Types: Cigarettes  . Smokeless tobacco: Never Used  . Alcohol use No    Prior to Admission medications   Medication Sig Start Date End Date Taking? Authorizing Provider  albuterol (PROVENTIL) (2.5 MG/3ML)  0.083% nebulizer solution Take 2.5 mg by nebulization every 4 (four) hours as needed for wheezing or shortness of breath.    [provider]  aspirin EC 81 MG tablet Take 1 tablet (81 mg total) by mouth daily. 03/16/15   Catarina Hartshorn, MD  budesonide-formoterol (SYMBICORT) 160-4.5 MCG/ACT inhaler Inhale 2 puffs into the lungs 2 (two) times daily.    [provider]  cephALEXin (KEFLEX) 500 MG capsule Take 1 capsule (500 mg total) by mouth 3 (three) times daily. 01/17/16   Azalia Bilis, MD  docusate sodium (COLACE) 100 MG capsule Take 1 capsule (100 mg total) by mouth 2 (two) times daily. 03/16/15   Catarina Hartshorn, MD  levothyroxine (SYNTHROID, LEVOTHROID) 25 MCG tablet Take 25 mcg by mouth daily before breakfast.    [provider]  lidocaine (XYLOCAINE) 2 % jelly  Apply 1 application topically 2 (two) times daily as needed (pain). Apply 1/4 inch to knee as needed    [provider]  lubiprostone (AMITIZA) 24 MCG capsule Take 24 mcg by mouth 2 (two) times daily.    [provider]  meloxicam (MOBIC) 15 MG tablet TAKE 1 TABLET BY MOUTH EVERY MORNING WITH FOOD 11/15/16   Kerrin Champagne, MD  methocarbamol (ROBAXIN) 500 MG tablet Take 1 tablet (500 mg total) by mouth every 8 (eight) hours as needed for muscle spasms. 12/12/16   Pricilla Loveless, MD  omeprazole (PRILOSEC) 20 MG capsule Take 20 mg by mouth 2 (two) times daily.    [provider]  ondansetron (ZOFRAN ODT) 8 MG disintegrating tablet Take 1 tablet (8 mg total) by mouth every 8 (eight) hours as needed for nausea or vomiting. 01/17/16   Azalia Bilis, MD  oxyCODONE-acetaminophen (PERCOCET) 10-325 MG tablet Take 1 tablet by mouth every 4 (four) hours as needed for pain.    [provider]  predniSONE (DELTASONE) 10 MG tablet Take 4 tablets (40 mg total) by mouth daily. 09/22/16   Vanetta Mulders, MD  Pregabalin (LYRICA PO) Take by mouth.    [provider]  promethazine (PHENERGAN) 25 MG tablet Take 25 mg by mouth 3 (three) times daily as needed for nausea or vomiting.    [provider]  traZODone (DESYREL) 100 MG tablet Take 100 mg by mouth at bedtime.     [provider]  UNKNOWN TO PATIENT     [provider]  UNKNOWN TO PATIENT     [provider]  VOLTAREN 1 % GEL APPLY 2 TO 4 GRAMS TO THE AFFECTED AREA 2 TO 3 TIMES DAILY 10/16/16   Kerrin Champagne, MD    Allergies Patient has no known allergies.   REVIEW OF SYSTEMS  Negative except as noted here or in the History of Present Illness.   PHYSICAL EXAMINATION  Initial Vital Signs Blood pressure (!) 152/55, pulse (!) 58, temperature 99.1 F (37.3 C), temperature source Oral, resp. rate 20, height 5\' 3"  (1.6 m), weight 53.5 kg (118 lb), SpO2 95  %.  Examination General: Well-developed, well-nourished female in no acute distress; appearance consistent with age of record HENT: normocephalic; atraumatic Eyes: pupils equal, round and reactive to light; extraocular muscles intact; lens implants Neck: supple Heart: regular rate and rhythm Lungs: clear to auscultation bilaterally Abdomen: soft; nondistended; nontender; bowel sounds present Back: Mild bilateral paralumbar tenderness; positive straight leg raise on the right at 45, positive straight leg raise on the left 60 Extremities: No deformity; full range of motion; pulses normal Neurologic: Awake,  alert and oriented; motor function intact in all extremities and symmetric; sensation intact in lower extremities and symmetric no facial droop Skin: Warm and dry Psychiatric: Flat affect   RESULTS  Summary of this visit's results, reviewed by myself:   EKG Interpretation  Date/Time:    Ventricular Rate:    PR Interval:    QRS Duration:   QT Interval:    QTC Calculation:   R Axis:     Text Interpretation:        Laboratory Studies: No results found for this or any previous visit (from the past 24 hour(s)). Imaging Studies: No results found.  ED COURSE  Nursing notes and initial vitals signs, including pulse oximetry, reviewed.  Vitals:   12/16/16 2025 12/16/16 2026  BP:  (!) 152/55  Pulse:  (!) 58  Resp:  20  Temp:  99.1 F (37.3 C)  TempSrc:  Oral  SpO2:  95%  Weight: 53.5 kg (118 lb)   Height: 5\' 3"  (1.6 m)    Patient advised of our policy not to refill narcotic prescriptions for chronic pain. We will provide her another shot of Dilaudid and refill her Robaxin. She will have to follow-up with her pain management doctor for definitive treatment. She also reports that she has epidural steroid injections pending.  PROCEDURES    ED DIAGNOSES     ICD-10-CM   1. Chronic bilateral low back pain with bilateral sciatica M54.42    M54.41    G89.29         Kynzley Dowson, Jonny Ruiz, MD 12/16/16 2352

## 2016-12-17 DIAGNOSIS — M5442 Lumbago with sciatica, left side: Secondary | ICD-10-CM | POA: Diagnosis not present

## 2016-12-26 ENCOUNTER — Other Ambulatory Visit (INDEPENDENT_AMBULATORY_CARE_PROVIDER_SITE_OTHER): Payer: Self-pay | Admitting: Specialist

## 2016-12-26 NOTE — Telephone Encounter (Signed)
meloxicam refill request 

## 2016-12-29 DIAGNOSIS — M5136 Other intervertebral disc degeneration, lumbar region: Secondary | ICD-10-CM | POA: Insufficient documentation

## 2017-02-23 DIAGNOSIS — G629 Polyneuropathy, unspecified: Secondary | ICD-10-CM | POA: Insufficient documentation

## 2017-03-20 ENCOUNTER — Other Ambulatory Visit (INDEPENDENT_AMBULATORY_CARE_PROVIDER_SITE_OTHER): Payer: Self-pay | Admitting: Specialist

## 2017-03-21 NOTE — Telephone Encounter (Signed)
Meloxicam refill request

## 2017-04-07 ENCOUNTER — Other Ambulatory Visit: Payer: Self-pay

## 2017-04-07 ENCOUNTER — Emergency Department (HOSPITAL_BASED_OUTPATIENT_CLINIC_OR_DEPARTMENT_OTHER)
Admission: EM | Admit: 2017-04-07 | Discharge: 2017-04-07 | Disposition: A | Discharge status: Home / Self Care | Payer: Medicare Other | Attending: Emergency Medicine | Admitting: Emergency Medicine

## 2017-04-07 ENCOUNTER — Encounter (HOSPITAL_BASED_OUTPATIENT_CLINIC_OR_DEPARTMENT_OTHER): Payer: Self-pay | Admitting: Adult Health

## 2017-04-07 DIAGNOSIS — F419 Anxiety disorder, unspecified: Secondary | ICD-10-CM | POA: Diagnosis not present

## 2017-04-07 DIAGNOSIS — Z79899 Other long term (current) drug therapy: Secondary | ICD-10-CM | POA: Insufficient documentation

## 2017-04-07 DIAGNOSIS — F17210 Nicotine dependence, cigarettes, uncomplicated: Secondary | ICD-10-CM | POA: Diagnosis not present

## 2017-04-07 DIAGNOSIS — J449 Chronic obstructive pulmonary disease, unspecified: Secondary | ICD-10-CM | POA: Diagnosis not present

## 2017-04-07 DIAGNOSIS — J45909 Unspecified asthma, uncomplicated: Secondary | ICD-10-CM | POA: Diagnosis not present

## 2017-04-07 DIAGNOSIS — G8929 Other chronic pain: Secondary | ICD-10-CM | POA: Diagnosis not present

## 2017-04-07 DIAGNOSIS — M5440 Lumbago with sciatica, unspecified side: Secondary | ICD-10-CM | POA: Insufficient documentation

## 2017-04-07 DIAGNOSIS — N390 Urinary tract infection, site not specified: Secondary | ICD-10-CM | POA: Diagnosis not present

## 2017-04-07 DIAGNOSIS — E039 Hypothyroidism, unspecified: Secondary | ICD-10-CM | POA: Diagnosis not present

## 2017-04-07 DIAGNOSIS — M549 Dorsalgia, unspecified: Secondary | ICD-10-CM | POA: Diagnosis present

## 2017-04-07 DIAGNOSIS — Z7982 Long term (current) use of aspirin: Secondary | ICD-10-CM | POA: Insufficient documentation

## 2017-04-07 LAB — URINALYSIS, ROUTINE W REFLEX MICROSCOPIC
BILIRUBIN URINE: NEGATIVE
Glucose, UA: NEGATIVE mg/dL
Ketones, ur: NEGATIVE mg/dL
Nitrite: NEGATIVE
Protein, ur: NEGATIVE mg/dL
pH: 5.5 (ref 5.0–8.0)

## 2017-04-07 LAB — URINALYSIS, MICROSCOPIC (REFLEX)

## 2017-04-07 MED ORDER — HYDROMORPHONE HCL 1 MG/ML IJ SOLN
1.0000 mg | Freq: Once | INTRAMUSCULAR | Status: AC
  Administered 2017-04-07: 1 mg via INTRAMUSCULAR
  Filled 2017-04-07: qty 1

## 2017-04-07 MED ORDER — ACETAMINOPHEN 500 MG PO TABS
1000.0000 mg | ORAL_TABLET | Freq: Once | ORAL | Status: AC
  Administered 2017-04-07: 1000 mg via ORAL
  Filled 2017-04-07: qty 2

## 2017-04-07 MED ORDER — CEPHALEXIN 500 MG PO CAPS: 500.0000 mg | ORAL_CAPSULE | Freq: Four times a day (QID) | ORAL | 0 refills | Status: DC

## 2017-04-07 MED ORDER — LIDOCAINE 5 % EX PTCH: 1.0000 | MEDICATED_PATCH | CUTANEOUS | 0 refills | Status: DC

## 2017-04-07 NOTE — ED Provider Notes (Signed)
[facility] EMERGENCY DEPARTMENT Provider Note   CSN: [id number] Arrival date & time: 04/07/17  1453     History   Chief Complaint Chief Complaint  Patient presents with  . Back Pain    HPI Patricia Sutton is a 71 y.o. female.  71 year old female with past medical history including chronic back pain, COPD, depression, OSA who p/w back pain. PT reports h/o chronic back pain radiating to both legs for which she has steroid injections every 3 months.  She is due for a steroid injection now and has it scheduled in 4 days.  For the past several weeks, her chronic pain has been getting progressively worse.  She denies any numbness or weakness.  She reports some dysuria but no urinary incontinence, bowel incontinence, or fevers.  She states that she takes ibuprofen or Tylenol for her symptoms but it does not help.  She has not taken any medications today for her pain.  Her pain is constant but more severe with certain movements and with sitting straight up.  She has to move around and cannot stay still because the pain is so bad.  She denies any cold symptoms or recent illness.  She used to be prescribed a patch for her pain but is no longer on this medication.   The history is provided by the patient.  Back Pain      Past Medical History:  Diagnosis Date  . Anxiety   . Arthritis   . Asthma   . Back pain, chronic   . Bone spur of other site    spine  . Chronic back pain    spurs on spine  . COPD (chronic obstructive pulmonary disease) (HCC)    denies SOB  . Depression    takes Effexor daily  . GERD (gastroesophageal reflux disease)    takes Omeprazole daily  . History of bronchitis    last time about 6+yrs ago  . History of colon polyps   . History of kidney stones   . Hypothyroidism    takes Synthroid daily  . Insomnia    takes Trazodone nightly and Ambien  . Joint pain   . Joint swelling   . Nocturia   . Pneumonia    hx of;last time about 6+yrs ago  . PONV  (postoperative nausea and vomiting)   . Renal insufficiency   . Sleep apnea    no CPAP; uses O2 at night 1l/min.  Marland Kitchen Urinary urgency     Patient Active Problem List   Diagnosis Date Noted  . Tobacco use disorder 03/15/2015  . UTI (lower urinary tract infection) 03/15/2015  . COPD (chronic obstructive pulmonary disease) (HCC) 03/15/2015  . Hypothyroidism 03/15/2015  . Chronic pain syndrome 03/15/2015  . Dehydration 03/15/2015  . Elevated troponin 03/14/2015  . Osteoarthritis of right knee 07/18/2013    Class: Chronic  . Loose body of right knee 07/16/2012    Class: Chronic    Past Surgical History:  Procedure Laterality Date  . ABDOMINAL HYSTERECTOMY     complete  . bladder tacked    . COLONOSCOPY    . ESOPHAGOGASTRODUODENOSCOPY      OB History    No data available       Home Medications    Prior to Admission medications   Medication Sig Start Date End Date Taking? Authorizing Provider  albuterol (PROVENTIL) (2.5 MG/3ML) 0.083% nebulizer solution Take 2.5 mg by nebulization every 4 (four) hours as needed for wheezing or shortness  of breath.    [provider]  aspirin EC 81 MG tablet Take 1 tablet (81 mg total) by mouth daily. 03/16/15   Catarina Hartshornat, David, MD  budesonide-formoterol (SYMBICORT) 160-4.5 MCG/ACT inhaler Inhale 2 puffs into the lungs 2 (two) times daily.    [provider]  cephALEXin (KEFLEX) 500 MG capsule Take 1 capsule (500 mg total) by mouth 3 (three) times daily. 01/17/16   Azalia Bilisampos, Kevin, MD  docusate sodium (COLACE) 100 MG capsule Take 1 capsule (100 mg total) by mouth 2 (two) times daily. 03/16/15   Catarina Hartshornat, David, MD  levothyroxine (SYNTHROID, LEVOTHROID) 25 MCG tablet Take 25 mcg by mouth daily before breakfast.    [provider]  lidocaine (XYLOCAINE) 2 % jelly Apply 1 application topically 2 (two) times daily as needed (pain). Apply 1/4 inch to knee as needed    [provider]  lubiprostone (AMITIZA) 24 MCG capsule Take  24 mcg by mouth 2 (two) times daily.    [provider]  meloxicam (MOBIC) 15 MG tablet TAKE 1 TABLET BY MOUTH EVERY MORNING WITH FOOD 12/27/16   Kerrin ChampagneNitka, James E, MD  meloxicam (MOBIC) 15 MG tablet TAKE 1 TABLET BY MOUTH EVERY MORNING WITH FOOD 03/21/17   Kerrin ChampagneNitka, James E, MD  methocarbamol (ROBAXIN) 500 MG tablet Take 1 tablet (500 mg total) by mouth every 8 (eight) hours as needed for muscle spasms. 12/16/16   Molpus, John, MD  omeprazole (PRILOSEC) 20 MG capsule Take 20 mg by mouth 2 (two) times daily.    [provider]  ondansetron (ZOFRAN ODT) 8 MG disintegrating tablet Take 1 tablet (8 mg total) by mouth every 8 (eight) hours as needed for nausea or vomiting. 01/17/16   Azalia Bilisampos, Kevin, MD  predniSONE (DELTASONE) 10 MG tablet Take 4 tablets (40 mg total) by mouth daily. 09/22/16   Vanetta MuldersZackowski, Scott, MD  Pregabalin (LYRICA PO) Take by mouth.    [provider]  promethazine (PHENERGAN) 25 MG tablet Take 25 mg by mouth 3 (three) times daily as needed for nausea or vomiting.    [provider]  traZODone (DESYREL) 100 MG tablet Take 100 mg by mouth at bedtime.     [provider]  VOLTAREN 1 % GEL APPLY 2 TO 4 GRAMS TO THE AFFECTED AREA 2 TO 3 TIMES DAILY 10/16/16   Kerrin ChampagneNitka, James E, MD    Family History Family History  Problem Relation Age of Onset  . Heart attack Mother   . Stroke Mother   . Diabetes Mellitus II Mother   . Lymphoma Father     Social History Social History   Tobacco Use  . Smoking status: Current Some Day Smoker    Packs/day: 1.00    Years: 40.00    Pack years: 40.00    Types: Cigarettes  . Smokeless tobacco: Never Used  Substance Use Topics  . Alcohol use: No  . Drug use: No     Allergies   Patient has no known allergies.   Review of Systems Review of Systems  Musculoskeletal: Positive for back pain.   All other systems reviewed and are negative except that which was mentioned in HPI   Physical Exam Updated Vital  Signs BP (!) 141/78   Pulse 68   Temp 98.3 F (36.8 C) (Oral)   Resp 18   Wt 51.7 kg (114 lb)   SpO2 100%   BMI 20.19 kg/m   Physical Exam  Constitutional: She is oriented to person, place, and  time. She appears well-developed and well-nourished. She appears distressed.  HENT:  Head: Normocephalic and atraumatic.  Mouth/Throat: Oropharynx is clear and moist.  Moist mucous membranes  Eyes: Conjunctivae are normal.  Neck: Neck supple.  Cardiovascular: Normal rate, regular rhythm and normal heart sounds.  No murmur heard. Pulmonary/Chest: Effort normal. No respiratory distress. She has wheezes.  Abdominal: Soft. Bowel sounds are normal. She exhibits no distension. There is no tenderness.  Musculoskeletal: She exhibits no edema.  No focal back tenderness or skin changes  Neurological: She is alert and oriented to person, place, and time. She displays normal reflexes. No sensory deficit.  Fluent speech 5/5 strength BLE, no clonus  Skin: Skin is warm. She is diaphoretic.  Psychiatric:  Distressed, anxious  Nursing note and vitals reviewed.    ED Treatments / Results  Labs (all labs ordered are listed, but only abnormal results are displayed) Labs Reviewed  URINALYSIS, ROUTINE W REFLEX MICROSCOPIC - Abnormal; Notable for the following components:      Result Value   Specific Gravity, Urine <1.005 (*)    Hgb urine dipstick TRACE (*)    Leukocytes, UA SMALL (*)    All other components within normal limits  URINALYSIS, MICROSCOPIC (REFLEX) - Abnormal; Notable for the following components:   Bacteria, UA FEW (*)    Squamous Epithelial / LPF 0-5 (*)    All other components within normal limits  URINE CULTURE    EKG  EKG Interpretation None       Radiology No results found.  Procedures Procedures (including critical care time)  Medications Ordered in ED Medications  acetaminophen (TYLENOL) tablet 1,000 mg (not administered)  HYDROmorphone (DILAUDID) injection 1  mg (not administered)     Initial Impression / Assessment and Plan / ED Course  I have reviewed the triage vital signs and the nursing notes.  Pertinent labs  that were available during my care of the patient were reviewed by me and considered in my medical decision making (see chart for details).    Chronic back pain presenting with worsening of her chronic symptoms.  No signs or symptoms of cauda equina, normal distal neurologic exam.  Afebrile.  Given she states this is her chronic pain and she denies any infectious symptoms or new neurologic complaints, I do not feel she needs imaging.   UA has small leukocytes, some WBCs and bacteria. She describes pain as b/l across back and has no significant hematuria to suggest kidney stone. Because she endorses dysuria symptoms, will treat with Keflex and I have sent urine culture. She describes this as worsening of the same pain she has had previously, she is afebrile, and her UA is not overwhelmingly positive for infection, thus pyelonephritis seems less likely, but I will cover for pyelo given her several days of dysuria now combined with worsening back pain.   I explained that the ED does not prescribe narcotics for chronic pain conditions but I provided her a one-time dose of IM medication because she was in severe distress initially. I emphasized the importance of taking medication on a schedule as she has not had any medication in her system today and will likely have poor pain control if she takes nothing.  Recommended scheduled Tylenol and cautioned on chronic use of NSAIDs.  Provided with lidocaine patches to use as needed.  She will follow-up with her outpatient providers for ongoing management.  Return precautions given and patient discharged in satisfactory condition.  Final Clinical Impressions(s) / ED Diagnoses  Final diagnoses:  None    ED Discharge Orders    None       Sibley Rolison, Ambrose Finlandachel Morgan, MD 04/07/17 [phone]

## 2017-04-07 NOTE — ED Triage Notes (Addendum)
PResents with lumbar back pain that goes into right leg and is worse with movement. SHe endorses dysuria and weakness. Denies loss of bowel and bladder. She reprots that that is hard to stay in one position long due to the pain. History of chronic back pain but this different. The pain started yesterday. Nothing makes pain better. She has tried voltaren gel and Ibuprofen without relief. SHe reorts that she usually gets epidurals for this pain, but this is the worse pain she has ever felt.

## 2017-04-10 LAB — URINE CULTURE
Culture: >=100000 — AB
SPECIAL REQUESTS: NORMAL

## 2017-04-11 ENCOUNTER — Telehealth (HOSPITAL_BASED_OUTPATIENT_CLINIC_OR_DEPARTMENT_OTHER): Payer: Self-pay

## 2017-04-11 NOTE — Telephone Encounter (Signed)
Post ED Visit - Positive Culture Follow-up: Successful Patient Follow-Up  Culture assessed and recommendations reviewed by: []  Enzo BiNathan Batchelder, Pharm.D. []  Celedonio MiyamotoJeremy Frens, Pharm.D., BCPS AQ-ID []  Garvin FilaMike Maccia, Pharm.D., BCPS []  Georgina PillionElizabeth Martin, Pharm.D., BCPS []  East KapoleiMinh Pham, [address] BoulevardPharm.D., BCPS, AAHIVP []  Estella HuskMichelle Turner, Pharm.D., BCPS, AAHIVP []  Lysle Pearlachel Rumbarger, PharmD, BCPS []  Casilda Carlsaylor Stone, PharmD, BCPS []  Pollyann SamplesAndy Johnston, PharmD, BCPS  Positive urine culture >/= 100,000 colonies -> acinetobacter calcoaceticus/baumannii complex  []  Patient discharged without antimicrobial prescription and treatment is now indicated []  Organism is resistant to prescribed ED discharge antimicrobial []  Patient with positive blood cultures  Changes discussed with ED provider: Clint GuyMicheal Maczis PA New antibiotic prescription Discontinue Keflex.  Start Bactrim DS BID x 7 days. Called to   Contacted patient, date 04/11/17, time 1208  Left VM requesting callback.  Letter sent to Epic address.   Arvid RightClark, Meegan Shanafelt Dorn 04/11/2017, 12:10 PM

## 2017-04-11 NOTE — Progress Notes (Signed)
ED Antimicrobial Stewardship Positive Culture Follow Up   Marin CommentVicky D Hakeem is an 71 y.o. female [facility] with a chief complaint of  Chief Complaint  Patient presents with  . Back Pain    Recent Results (from the past 720 hour(s))  Urine culture     Status: Abnormal   Collection Time: 04/07/17  3:14 PM  Result Value Ref Range Status   Specimen Description URINE, CLEAN CATCH  Final   Special Requests Normal  Final   Culture (A)  Final    >=100,000 COLONIES/mL ACINETOBACTER CALCOACETICUS/BAUMANNII COMPLEX   Report Status 04/10/2017 FINAL  Final   Organism ID, Bacteria ACINETOBACTER CALCOACETICUS/BAUMANNII COMPLEX (A)  Final      Susceptibility   Acinetobacter calcoaceticus/baumannii complex - MIC*    CEFTAZIDIME 4 SENSITIVE Sensitive     CEFTRIAXONE 8 SENSITIVE Sensitive     CIPROFLOXACIN <=0.25 SENSITIVE Sensitive     GENTAMICIN <=1 SENSITIVE Sensitive     IMIPENEM <=0.25 SENSITIVE Sensitive     PIP/TAZO <=4 SENSITIVE Sensitive     TRIMETH/SULFA <=20 SENSITIVE Sensitive     CEFEPIME 2 SENSITIVE Sensitive     AMPICILLIN/SULBACTAM <=2 SENSITIVE Sensitive     * >=100,000 COLONIES/mL ACINETOBACTER CALCOACETICUS/BAUMANNII COMPLEX    [x]  Treated with Keflex, organism resistant to prescribed antimicrobial  New antibiotic prescription: Discontinue Keflex, initiate Bactrim DS twice a day for 7 days  ED Provider: Particia LatherMichael Maczis   Willamae Demby N Seydina Holliman, PharmD PGY1 Pharmacy Resident ID pharmacist phone # [phone] 04/11/17 9:31 AM

## 2017-04-16 DIAGNOSIS — F325 Major depressive disorder, single episode, in full remission: Secondary | ICD-10-CM | POA: Insufficient documentation

## 2017-04-16 DIAGNOSIS — F419 Anxiety disorder, unspecified: Secondary | ICD-10-CM | POA: Insufficient documentation

## 2017-04-17 ENCOUNTER — Encounter (INDEPENDENT_AMBULATORY_CARE_PROVIDER_SITE_OTHER): Payer: Self-pay | Admitting: Specialist

## 2017-04-17 ENCOUNTER — Ambulatory Visit (INDEPENDENT_AMBULATORY_CARE_PROVIDER_SITE_OTHER): Payer: Medicare Other | Admitting: Specialist

## 2017-04-17 ENCOUNTER — Ambulatory Visit (INDEPENDENT_AMBULATORY_CARE_PROVIDER_SITE_OTHER): Payer: Medicare Other

## 2017-04-17 VITALS — BP 142/76 | HR 80 | Ht 63.0 in | Wt 116.0 lb

## 2017-04-17 DIAGNOSIS — M5136 Other intervertebral disc degeneration, lumbar region: Secondary | ICD-10-CM

## 2017-04-17 DIAGNOSIS — M4807 Spinal stenosis, lumbosacral region: Secondary | ICD-10-CM | POA: Diagnosis not present

## 2017-04-17 DIAGNOSIS — M545 Low back pain: Secondary | ICD-10-CM | POA: Diagnosis not present

## 2017-04-17 DIAGNOSIS — G8929 Other chronic pain: Secondary | ICD-10-CM

## 2017-04-17 MED ORDER — OXYCODONE-ACETAMINOPHEN 5-325 MG PO TABS
1.0000 | ORAL_TABLET | Freq: Four times a day (QID) | ORAL | 0 refills | Status: DC | PRN
Start: 2017-04-17 — End: 2017-09-26

## 2017-04-17 NOTE — Progress Notes (Signed)
Office Visit Note   Patient: Patricia Sutton           Date of Birth: 1945-11-29           MRN: [id number] Visit Date: 04/17/2017              Requested by: No referring provider defined for this encounter. PCP: Patient, No Pcp Per   Assessment & Plan: Visit Diagnoses:  1. Chronic low back pain, unspecified back pain laterality, with sciatica presence unspecified   2. Degenerative disc disease, lumbar   3. Other intervertebral disc degeneration, lumbar region   4. Spinal stenosis of lumbosacral region    Plan: Avoid frequent bending and stooping  No lifting greater than 10 lbs. May use ice or moist heat for pain. Weight loss is of benefit. Handicap license is approved. MRI with and without enhancement is ordered.   Follow-Up Instructions: Return in about 3 weeks (around 05/08/2017).   Orders:  Orders Placed This Encounter  Procedures  . XR Lumbar Spine 2-3 Views  . MR Lumbar Spine W Wo Contrast   No orders of the defined types were placed in this encounter.     Procedures: No procedures performed   Clinical Data: No additional findings.   Subjective: Chief Complaint  Patient presents with  . Lower Back - Pain    [age] year od female seen today with increasing back and right thigh pain and right posterior calf pain with aching qualitiy. Pain is continuous. She has been seen in pain management Dr. Noemi ChapelShutz and has had ESIs, these are not helping since 3 months ago, one in August last 3-4 weeks. The injections now don't last more than one weeks. The most recent 5 days ago with out relief at all. The pain is to where she notes that she has to find some way to get relief. Bowel and bladder are functioning normally, pain sometimes limits walking, can walk some, she does very little grocery shopping. Her son does the grocery shopping, the last 2 months she has been unable to do any thing. She avoid doing house chores cooking, has to sit several times when cooking, no  laundry, Bending over the laundry and dishwasher increases the pain. 1-10 the pain is 10. Riding in the car long increases the pain, sitting flat on the bottom increases the pressure on her back.  Bending, and stooping are painful.     Review of Systems  Constitutional: Negative.   HENT: Negative.   Eyes: Negative.   Respiratory: Negative.   Cardiovascular: Negative.   Gastrointestinal: Negative.   Endocrine: Negative.   Genitourinary: Negative.   Musculoskeletal: Negative.   Skin: Negative.   Allergic/Immunologic: Negative.   Neurological: Negative.   Hematological: Negative.   Psychiatric/Behavioral: Negative.      Objective: Vital Signs: BP (!) 142/76 (BP Location: Left Arm, Patient Position: Sitting)   Pulse 80   Ht 5\' 3"  (1.6 m)   Wt 116 lb (52.6 kg)   BMI 20.55 kg/m   Physical Exam  Back Exam   Tenderness  The patient is experiencing tenderness in the lumbar.  Range of Motion  Extension: abnormal  Flexion: abnormal  Lateral bend right: abnormal  Lateral bend left: abnormal  Rotation right: abnormal  Rotation left: abnormal   Muscle Strength  Right Quadriceps:  5/5  Left Quadriceps:  5/5  Right Hamstrings:  5/5  Left Hamstrings:  5/5   Tests  Straight leg raise right: negative Straight leg raise left:  negative  Reflexes  Patellar: normal Achilles: normal Babinski's sign: normal   Other  Toe walk: normal Heel walk: normal Sensation: normal Erythema: no back redness Scars: absent      Specialty Comments:  No specialty comments available.  Imaging: No results found.   PMFS History: Patient Active Problem List   Diagnosis Date Noted  . Osteoarthritis of right knee 07/18/2013    Priority: High    Class: Chronic  . Loose body of right knee 07/16/2012    Priority: High    Class: Chronic  . Tobacco use disorder 03/15/2015  . UTI (lower urinary tract infection) 03/15/2015  . COPD (chronic obstructive pulmonary disease) (HCC)  03/15/2015  . Hypothyroidism 03/15/2015  . Chronic pain syndrome 03/15/2015  . Dehydration 03/15/2015  . Elevated troponin 03/14/2015   Past Medical History:  Diagnosis Date  . Anxiety   . Arthritis   . Asthma   . Back pain, chronic   . Bone spur of other site    spine  . Chronic back pain    spurs on spine  . COPD (chronic obstructive pulmonary disease) (HCC)    denies SOB  . Depression    takes Effexor daily  . GERD (gastroesophageal reflux disease)    takes Omeprazole daily  . History of bronchitis    last time about 6+yrs ago  . History of colon polyps   . History of kidney stones   . Hypothyroidism    takes Synthroid daily  . Insomnia    takes Trazodone nightly and Ambien  . Joint pain   . Joint swelling   . Nocturia   . Pneumonia    hx of;last time about 6+yrs ago  . PONV (postoperative nausea and vomiting)   . Renal insufficiency   . Sleep apnea    no CPAP; uses O2 at night 1l/min.  Marland Kitchen. Urinary urgency     Family History  Problem Relation Age of Onset  . Heart attack Mother   . Stroke Mother   . Diabetes Mellitus II Mother   . Lymphoma Father     Past Surgical History:  Procedure Laterality Date  . ABDOMINAL HYSTERECTOMY     complete  . bladder tacked    . COLONOSCOPY    . ESOPHAGOGASTRODUODENOSCOPY    . KNEE ARTHROPLASTY Right 07/18/2013   Procedure: COMPUTER ASSISTED RIGHT TOTAL KNEE ARTHROPLASTY;  Surgeon: Kerrin ChampagneJames E Nitka, MD;  Location: [facility];  Service: Orthopedics;  Laterality: Right;  . KNEE ARTHROSCOPY Right 07/16/2012   Procedure: ARTHROSCOPY KNEE;  Surgeon: Kerrin ChampagneJames E Nitka, MD;  Location: [facility];  Service: Orthopedics;  Laterality: Right;  Right knee arthroscopy with chondroplastic shaving of retropatella surface   Social History   Occupational History  . Not on file  Tobacco Use  . Smoking status: Current Some Day Smoker    Packs/day: 1.00    Years: 40.00    Pack years: 40.00    Types: Cigarettes  . Smokeless tobacco:  Never Used  Substance and Sexual Activity  . Alcohol use: No  . Drug use: No  . Sexual activity: Not on file

## 2017-04-17 NOTE — Patient Instructions (Addendum)
Avoid frequent bending and stooping  No lifting greater than 10 lbs. May use ice or moist heat for pain. Weight loss is of benefit. Handicap license is approved. MRI with and without enhancement is ordered.

## 2017-04-26 ENCOUNTER — Other Ambulatory Visit (INDEPENDENT_AMBULATORY_CARE_PROVIDER_SITE_OTHER): Payer: Self-pay | Admitting: Specialist

## 2017-04-26 MED ORDER — VOLTAREN 1 % TD GEL: TRANSDERMAL | 11 refills | Status: DC

## 2017-04-26 NOTE — Telephone Encounter (Signed)
Sent to Dr. Nitka ° ° °

## 2017-04-26 NOTE — Telephone Encounter (Signed)
Kreitzer,Kila D  [birth year]        Oxycodone-acetaminophen 5-325 mg tablet. Pt needs a refill on her Gel not Voltaren. Pt stated no one has called to sched MRI,

## 2017-04-26 NOTE — Progress Notes (Signed)
Rx for voltaren gel sent to her pharmacy, will not continue to fill percocet. jen

## 2017-04-27 ENCOUNTER — Telehealth (INDEPENDENT_AMBULATORY_CARE_PROVIDER_SITE_OTHER): Payer: Self-pay | Admitting: Specialist

## 2017-04-30 NOTE — Telephone Encounter (Signed)
FYI

## 2017-04-30 NOTE — Telephone Encounter (Signed)
[facility]. I called pt and left message on vm stating [facility] has called and left her a number to contact them to schedule.

## 2017-05-02 ENCOUNTER — Other Ambulatory Visit (INDEPENDENT_AMBULATORY_CARE_PROVIDER_SITE_OTHER): Payer: Self-pay | Admitting: *Deleted

## 2017-05-02 NOTE — Telephone Encounter (Signed)
Sent to Dr. Nitka ° ° °

## 2017-05-02 NOTE — Telephone Encounter (Signed)
Pt is needing refill on oxycodone until she has her MRI which is scheduled 12/14, states Dr. Otelia SergeantNitka told her if she needs refill to call and he will refill them until she is seen. Please advise.   CB# [phone]

## 2017-05-03 NOTE — Telephone Encounter (Signed)
I called and lmom that her rx was denied due to : not enough pathology to warrant this strong narcotic.

## 2017-05-07 ENCOUNTER — Other Ambulatory Visit: Payer: Medicare Other

## 2017-05-08 ENCOUNTER — Telehealth (INDEPENDENT_AMBULATORY_CARE_PROVIDER_SITE_OTHER): Payer: Self-pay | Admitting: Specialist

## 2017-05-08 NOTE — Telephone Encounter (Signed)
Patient said she is needing a RX for her claustrophobia since her MRI is Friday, if it could be sent into her pharmacy

## 2017-05-08 NOTE — Telephone Encounter (Signed)
Patient said she is needing a RX for her claustrophobia since her MRI is Friday, if it could be sent into her pharmacy. CB # [phone]

## 2017-05-11 ENCOUNTER — Ambulatory Visit
Admission: RE | Admit: 2017-05-11 | Discharge: 2017-05-11 | Disposition: A | Discharge status: Home / Self Care | Payer: Medicare Other | Source: Ambulatory Visit | Attending: Specialist | Admitting: Specialist

## 2017-05-11 DIAGNOSIS — M5136 Other intervertebral disc degeneration, lumbar region: Secondary | ICD-10-CM

## 2017-05-11 DIAGNOSIS — M4807 Spinal stenosis, lumbosacral region: Secondary | ICD-10-CM

## 2017-05-11 MED ORDER — GADOBENATE DIMEGLUMINE 529 MG/ML IV SOLN
10.0000 mL | Freq: Once | INTRAVENOUS | Status: AC | PRN
  Administered 2017-05-11: 10 mL via INTRAVENOUS

## 2017-05-18 ENCOUNTER — Other Ambulatory Visit (INDEPENDENT_AMBULATORY_CARE_PROVIDER_SITE_OTHER): Payer: Self-pay | Admitting: Specialist

## 2017-05-18 NOTE — Telephone Encounter (Signed)
Findings on MRI do not warrant continued prescribing of narcotics. Patricia Sutton

## 2017-05-18 NOTE — Telephone Encounter (Signed)
I called and advised patient of Dr. Barbaraann FasterNitka's message below, she has appt on 06/01/2017 to review the scan

## 2017-05-31 ENCOUNTER — Telehealth (INDEPENDENT_AMBULATORY_CARE_PROVIDER_SITE_OTHER): Payer: Self-pay | Admitting: Specialist

## 2017-05-31 NOTE — Telephone Encounter (Signed)
Returned call to patient left message on voicemail confirming appointment scheduled 06/01/17 at 9:00am with Dr. Otelia SergeantNitka

## 2017-05-31 NOTE — Telephone Encounter (Signed)
Patient said she got a text saying she needed to reschedule appt. I did not see any notes about needing to reschedule. She said she would still come in at 79 tomorrow if she doesn't hear other wise because she really needs to see Dr. Otelia SergeantNitka. Can you let patient know?

## 2017-06-01 ENCOUNTER — Ambulatory Visit (INDEPENDENT_AMBULATORY_CARE_PROVIDER_SITE_OTHER): Payer: Medicare Other

## 2017-06-01 ENCOUNTER — Encounter (INDEPENDENT_AMBULATORY_CARE_PROVIDER_SITE_OTHER): Payer: Self-pay | Admitting: Specialist

## 2017-06-01 ENCOUNTER — Ambulatory Visit (INDEPENDENT_AMBULATORY_CARE_PROVIDER_SITE_OTHER): Payer: Medicare Other | Admitting: Specialist

## 2017-06-01 VITALS — BP 151/64 | HR 59 | Ht 63.0 in | Wt 116.0 lb

## 2017-06-01 DIAGNOSIS — M47816 Spondylosis without myelopathy or radiculopathy, lumbar region: Secondary | ICD-10-CM

## 2017-06-01 DIAGNOSIS — M25561 Pain in right knee: Secondary | ICD-10-CM

## 2017-06-01 DIAGNOSIS — Z13820 Encounter for screening for osteoporosis: Secondary | ICD-10-CM

## 2017-06-01 MED ORDER — TRAMADOL HCL 50 MG PO TABS: 50.0000 mg | ORAL_TABLET | Freq: Four times a day (QID) | ORAL | 3 refills | Status: DC | PRN

## 2017-06-01 MED ORDER — NABUMETONE 500 MG PO TABS: 500.0000 mg | ORAL_TABLET | Freq: Two times a day (BID) | ORAL | 3 refills | Status: DC

## 2017-06-01 NOTE — Patient Instructions (Signed)
Avoid bending, stooping and avoid lifting weights greater than 10 lbs. Avoid prolong standing and walking. Avoid frequent bending and stooping  No lifting greater than 10 lbs. May use ice or moist heat for pain. Weight loss is of benefit. Handicap license is approved.  

## 2017-06-01 NOTE — Telephone Encounter (Signed)
We have her scheduled and we will see her

## 2017-06-01 NOTE — Progress Notes (Signed)
Office Visit Note   Patient: Patricia Sutton           Date of Birth: [birth year]           MRN: [id number] Visit Date: 06/01/2017              Requested by: No referring provider defined for this encounter. PCP: Patient, No Pcp Per   Assessment & Plan: Visit Diagnoses:  1. Right knee pain, unspecified chronicity   2. Spondylosis without myelopathy or radiculopathy, lumbar region   3. Screening for osteoporosis     Plan: Avoid bending, stooping and avoid lifting weights greater than 10 lbs. Avoid prolong standing and walking. Avoid frequent bending and stooping  No lifting greater than 10 lbs. May use ice or moist heat for pain. Weight loss is of benefit. Handicap license is approved.  Follow-Up Instructions: No Follow-up on file.   Orders:  Orders Placed This Encounter  Procedures  . XR KNEE 3 VIEW RIGHT   No orders of the defined types were placed in this encounter.     Procedures: No procedures performed   Clinical Data: No additional findings.   Subjective: Chief Complaint  Patient presents with  . Lower Back - Follow-up, Pain  . Right Knee - Pain    72 year old female with persistent pain in her back    Review of Systems  Constitutional: Negative.   HENT: Negative.   Eyes: Negative.   Respiratory: Negative.   Cardiovascular: Negative.   Gastrointestinal: Negative.   Endocrine: Negative.   Genitourinary: Negative.   Musculoskeletal: Negative.   Skin: Negative.   Allergic/Immunologic: Negative.   Neurological: Negative.   Hematological: Negative.   Psychiatric/Behavioral: Negative.      Objective: Vital Signs: BP (!) 151/64 (BP Location: Left Arm, Patient Position: Sitting)   Pulse (!) 59   Ht 5\' 3"  (1.6 m)   Wt 116 lb (52.6 kg)   BMI 20.55 kg/m   Physical Exam  Constitutional: She is oriented to person, place, and time. She appears well-developed and well-nourished.  HENT:  Head: Normocephalic and atraumatic.  Eyes: EOM are  normal. Pupils are equal, round, and reactive to light.  Neck: Normal range of motion. Neck supple.  Pulmonary/Chest: Effort normal and breath sounds normal.  Abdominal: Soft. Bowel sounds are normal.  Neurological: She is alert and oriented to person, place, and time.  Skin: Skin is warm and dry.  Psychiatric: She has a normal mood and affect. Her behavior is normal. Judgment and thought content normal.    Back Exam   Tenderness  The patient is experiencing tenderness in the lumbar.  Range of Motion  Extension:  20 abnormal  Flexion:  50 abnormal  Lateral bend right: abnormal  Lateral bend left: abnormal  Rotation right: abnormal  Rotation left: abnormal   Muscle Strength  Right Quadriceps:  5/5  Left Quadriceps:  5/5  Right Hamstrings:  5/5  Left Hamstrings:  5/5   Tests  Straight leg raise right: negative Straight leg raise left: negative  Reflexes  Patellar: normal Achilles: normal Babinski's sign: normal   Other  Toe walk: abnormal Heel walk: abnormal Erythema: no back redness Scars: absent      Specialty Comments:  No specialty comments available.  Imaging: Xr Knee 3 View Right  Result Date: 06/01/2017 Right total knee replacement with well seated components no sign of loosening or abnormality.     PMFS History: Patient Active Problem List   Diagnosis Date  Noted  . Osteoarthritis of right knee 07/18/2013    Priority: High    Class: Chronic  . Loose body of right knee 07/16/2012    Priority: High    Class: Chronic  . Tobacco use disorder 03/15/2015  . UTI (lower urinary tract infection) 03/15/2015  . COPD (chronic obstructive pulmonary disease) (HCC) 03/15/2015  . Hypothyroidism 03/15/2015  . Chronic pain syndrome 03/15/2015  . Dehydration 03/15/2015  . Elevated troponin 03/14/2015   Past Medical History:  Diagnosis Date  . Anxiety   . Arthritis   . Asthma   . Back pain, chronic   . Bone spur of other site    spine  . Chronic back  pain    spurs on spine  . COPD (chronic obstructive pulmonary disease) (HCC)    denies SOB  . Depression    takes Effexor daily  . GERD (gastroesophageal reflux disease)    takes Omeprazole daily  . History of bronchitis    last time about 6+yrs ago  . History of colon polyps   . History of kidney stones   . Hypothyroidism    takes Synthroid daily  . Insomnia    takes Trazodone nightly and Ambien  . Joint pain   . Joint swelling   . Nocturia   . Pneumonia    hx of;last time about 6+yrs ago  . PONV (postoperative nausea and vomiting)   . Renal insufficiency   . Sleep apnea    no CPAP; uses O2 at night 1l/min.  Marland Kitchen Urinary urgency     Family History  Problem Relation Age of Onset  . Heart attack Mother   . Stroke Mother   . Diabetes Mellitus II Mother   . Lymphoma Father     Past Surgical History:  Procedure Laterality Date  . ABDOMINAL HYSTERECTOMY     complete  . bladder tacked    . COLONOSCOPY    . ESOPHAGOGASTRODUODENOSCOPY    . KNEE ARTHROPLASTY Right 07/18/2013   Procedure: COMPUTER ASSISTED RIGHT TOTAL KNEE ARTHROPLASTY;  Surgeon: Kerrin Champagne, MD;  Location: [facility];  Service: Orthopedics;  Laterality: Right;  . KNEE ARTHROSCOPY Right 07/16/2012   Procedure: ARTHROSCOPY KNEE;  Surgeon: Kerrin Champagne, MD;  Location: [facility];  Service: Orthopedics;  Laterality: Right;  Right knee arthroscopy with chondroplastic shaving of retropatella surface   Social History   Occupational History  . Not on file  Tobacco Use  . Smoking status: Current Some Day Smoker    Packs/day: 1.00    Years: 40.00    Pack years: 40.00    Types: Cigarettes  . Smokeless tobacco: Never Used  Substance and Sexual Activity  . Alcohol use: No  . Drug use: No  . Sexual activity: Not on file

## 2017-06-13 ENCOUNTER — Ambulatory Visit (INDEPENDENT_AMBULATORY_CARE_PROVIDER_SITE_OTHER): Payer: Medicare Other

## 2017-06-13 ENCOUNTER — Encounter (INDEPENDENT_AMBULATORY_CARE_PROVIDER_SITE_OTHER): Payer: Self-pay | Admitting: Physical Medicine and Rehabilitation

## 2017-06-13 ENCOUNTER — Ambulatory Visit (INDEPENDENT_AMBULATORY_CARE_PROVIDER_SITE_OTHER): Payer: Medicare Other | Admitting: Physical Medicine and Rehabilitation

## 2017-06-13 VITALS — BP 148/78 | HR 70 | Temp 98.3°F

## 2017-06-13 DIAGNOSIS — M47816 Spondylosis without myelopathy or radiculopathy, lumbar region: Secondary | ICD-10-CM

## 2017-06-13 MED ORDER — METHYLPREDNISOLONE ACETATE 80 MG/ML IJ SUSP: 80.0000 mg | Freq: Once | INTRAMUSCULAR | Status: DC

## 2017-06-13 MED ORDER — BUPIVACAINE HCL 0.5 % IJ SOLN
3.0000 mL | Freq: Once | INTRAMUSCULAR | Status: AC
  Administered 2017-06-13: 3 mL

## 2017-06-13 NOTE — Patient Instructions (Signed)

## 2017-06-13 NOTE — Progress Notes (Deleted)
Pt states a sharp pain in lower back that radiates to the right thigh with some numbness in right thigh. Pt states pain has been going on for about 10 yrs and has gotten worse over the years. Pt states bending, squatting, and standing for a long period of time makes the pain worse, pt states heating pad eases pain. +Driver, -BT, Dye Allergies.

## 2017-06-13 NOTE — Procedures (Signed)
[name] is a 72 year old female with chronic worsening sharp lower back pain that does radiate to the right thigh to some degree with feels like may be paresthesia but no frank radicular complaints.  She reports this is been ongoing for 10 years or more.  [name] has been following her from a spine standpoint is recent obtain MRI of the lumbar spine which is reviewed below.  She reports that bending from sit to stand and gives her a lot of symptoms.  Reports using the heating pad.  Medications have helped but have not alleviated the problem.  He has had physical therapy in the past.  [name] requested diagnostic medial branch blocks with looking forward to radiofrequency ablation.  MRI does show facet arthritis at L4-5 and L5-S1.  Lumbar Diagnostic Facet Joint Nerve Block with Fluoroscopic Guidance   Patient: Patricia Sutton      Date of Birth: 1946/05/22 MRN: [id number] PCP: Patient, No Pcp Per      Visit Date: 06/13/2017   Universal Protocol:    Date/Time: 01/16/191:03 PM  Consent Given By: the patient  Position: PRONE  Additional Comments: Vital signs were monitored before and after the procedure. Patient was prepped and draped in the usual sterile fashion. The correct patient, procedure, and site was verified.   Injection Procedure Details:  Procedure Site One Meds Administered:  Meds ordered this encounter  Medications  . methylPREDNISolone acetate (DEPO-MEDROL) injection 80 mg     Laterality: Bilateral  Location/Site:  L4-L5 L5-S1  Needle size: 22 ga.  Needle type:spinal  Needle Placement: Oblique pedical  Findings:   -Comments: There was excellent flow of contrast along the articular pillars without intravascular flow.  Procedure Details: The fluoroscope beam is vertically oriented in AP and then obliqued 15 to 20 degrees to the ipsilateral side of the desired nerve to achieve the "Scotty dog" appearance.  The skin over the target area of the junction of  the superior articulating process and the transverse process (sacral ala if blocking the L5 dorsal rami) was locally anesthetized with a 1 ml volume of 1% Lidocaine without Epinephrine.  The spinal needle was inserted and advanced in a trajectory view down to the target.   After contact with periosteum and negative aspirate for blood and CSF, correct placement without intravascular or epidural spread was confirmed by injecting 0.5 ml. of Isovue-250.  A spot radiograph was obtained of this image.    Next, a 0.5 ml. volume of the injectate described above was injected. The needle was then redirected to the other facet joint nerves mentioned above if needed.  Prior to the procedure, the patient was given a Pain Diary which was completed for baseline measurements.  After the procedure, the patient rated their pain every 30 minutes and will continue rating at this frequency for a total of 5 hours.  The patient has been asked to complete the Diary and return to [place] by mail, fax or hand delivered as soon as possible.   Additional Comments:  The patient tolerated the procedure well Dressing: Band-Aid    Post-procedure details: Patient was observed during the procedure. Post-procedure instructions were reviewed.  Patient left the clinic in stable condition.  Pertinent Imaging: MRI LUMBAR SPINE WITHOUT AND WITH CONTRAST  TECHNIQUE: Multiplanar and multiecho pulse sequences of the lumbar spine were obtained without and with intravenous contrast.  CONTRAST:  11 mL MultiHance  COMPARISON:  09/06/2014  FINDINGS: Segmentation:  Standard.  Alignment:  Normal.  Vertebrae: Similar  appearance of mild diffuse bone marrow heterogeneity without destructive osseous lesion. A discrete 7 mm T1 hypointense focus in the L3 vertebral body is unchanged and likely benign. No fracture or significant marrow edema.  Conus medullaris and cauda equina: Conus extends to the L1 level. Conus and cauda  equina appear normal.  Paraspinal and other soft tissues: Unremarkable.  Disc levels:  Disc desiccation throughout the lumbar spine, greatest at L4-5. Disc desiccation and disc space narrowing at T12-L1.  T12-L1: Small central to left paracentral disc protrusion without stenosis, unchanged.  L1-2:  Negative.  L2-3: Mild right and mild-to-moderate left facet arthrosis without disc herniation or stenosis, similar to prior.  L3-4: Minimal disc bulging and moderate left greater than right facet hypertrophy without stenosis, unchanged.  L4-5: Minimal disc bulging and mild right and moderate left facet hypertrophy result in mild left lateral recess stenosis without spinal or neural foraminal stenosis, unchanged.  L5-S1: Moderate facet arthrosis without disc herniation or stenosis, unchanged.  IMPRESSION: Unchanged lumbar spondylosis and moderate facet arthrosis without high-grade stenosis. Mild left lateral recess stenosis at L4-5.   Electronically Signed   By: Sebastian AcheAllen  Grady M.D.   On: 05/11/2017 17:04

## 2017-06-20 ENCOUNTER — Other Ambulatory Visit (INDEPENDENT_AMBULATORY_CARE_PROVIDER_SITE_OTHER): Payer: Self-pay | Admitting: Specialist

## 2017-06-20 NOTE — Telephone Encounter (Signed)
meloxicam refill request 

## 2017-06-25 ENCOUNTER — Telehealth (INDEPENDENT_AMBULATORY_CARE_PROVIDER_SITE_OTHER): Payer: Self-pay | Admitting: Physical Medicine and Rehabilitation

## 2017-06-25 NOTE — Telephone Encounter (Signed)
Pain diary

## 2017-06-25 NOTE — Telephone Encounter (Signed)
Yes ok, please send back to me so I can document pain relief in a note!

## 2017-07-05 ENCOUNTER — Ambulatory Visit (INDEPENDENT_AMBULATORY_CARE_PROVIDER_SITE_OTHER): Payer: Self-pay

## 2017-07-05 ENCOUNTER — Encounter (INDEPENDENT_AMBULATORY_CARE_PROVIDER_SITE_OTHER): Payer: Self-pay | Admitting: Physical Medicine and Rehabilitation

## 2017-07-05 ENCOUNTER — Ambulatory Visit (INDEPENDENT_AMBULATORY_CARE_PROVIDER_SITE_OTHER): Payer: Medicare Other | Admitting: Physical Medicine and Rehabilitation

## 2017-07-05 VITALS — BP 125/73 | HR 54 | Temp 98.2°F

## 2017-07-05 DIAGNOSIS — M47816 Spondylosis without myelopathy or radiculopathy, lumbar region: Secondary | ICD-10-CM

## 2017-07-05 MED ORDER — BUPIVACAINE HCL 0.5 % IJ SOLN
3.0000 mL | Freq: Once | INTRAMUSCULAR | Status: AC
  Administered 2017-07-05: 3 mL

## 2017-07-05 NOTE — Patient Instructions (Addendum)
Radiofrequency Lesioning Radiofrequency lesioning is a procedure that is performed to relieve pain. The procedure is often used for back, neck, or arm pain. Radiofrequency lesioning involves the use of a machine that creates radio waves to make heat. During the procedure, the heat is applied to the nerve that carries the pain signal. The heat damages the nerve and interferes with the pain signal. Pain relief usually starts about 2 weeks after the procedure and lasts for 6 months to 1 year. Tell a health care provider about:  Any allergies you have.  All medicines you are taking, including vitamins, herbs, eye drops, creams, and over-the-counter medicines.  Any problems you or family members have had with anesthetic medicines.  Any blood disorders you have.  Any surgeries you have had.  Any medical conditions you have.  Whether you are pregnant or may be pregnant. What are the risks? Generally, this is a safe procedure. However, problems may occur, including:  Pain or soreness at the injection site.  Infection at the injection site.  Damage to nerves or blood vessels.  What happens before the procedure?  Ask your health care provider about: ? Changing or stopping your regular medicines. This is especially important if you are taking diabetes medicines or blood thinners. ? Taking medicines such as aspirin and ibuprofen. These medicines can thin your blood. Do not take these medicines before your procedure if your health care provider instructs you not to.  Follow instructions from your health care provider about eating or drinking restrictions.  Plan to have someone take you home after the procedure.  If you go home right after the procedure, plan to have someone with you for 24 hours. What happens during the procedure?  You will be given one or more of the following: ? A medicine to help you relax (sedative). ? A medicine to numb the area (local anesthetic).  You will be  awake during the procedure. You will need to be able to talk with the health care provider during the procedure.  With the help of a type of X-ray (fluoroscopy), the health care provider will insert a radiofrequency needle into the area to be treated.  Next, a wire that carries the radio waves (electrode) will be put through the radiofrequency needle. An electrical pulse will be sent through the electrode to verify the correct nerve. You will feel a tingling sensation, and you may have muscle twitching.  Then, the tissue that is around the needle tip will be heated by an electric current that is passed using the radiofrequency machine. This will numb the nerves.  A bandage (dressing) will be put on the insertion area after the procedure is done. The procedure may vary among health care providers and hospitals. What happens after the procedure?  Your blood pressure, heart rate, breathing rate, and blood oxygen level will be monitored often until the medicines you were given have worn off.  Return to your normal activities as directed by your health care provider. This information is not intended to replace advice given to you by your health care provider. Make sure you discuss any questions you have with your health care provider. Document Released: 01/11/2011 Document Revised: 10/21/2015 Document Reviewed: 06/22/2014 Elsevier Interactive Patient Education  2018 [employer].  [facility] Physiatry Discharge Instructions  *At any time if you have questions or concerns they can be answered by calling [phone]  All Patients: . You may experience an increase in your symptoms for the first 2 days (  it can take 2 days to 2 weeks for the steroid/cortisone to have its maximal effect). . You may use ice to the site for the first 24 hours; 20 minutes on and 20 minutes off and may use heat after that time. . You may resume and continue your current pain medications. If you need a refill  please contact the prescribing physician. . You may resume her regular medications if any were stopped for the procedure. . You may shower but no swimming, tub bath or Jacuzzi for 24 hours. . Please remove bandage after 4 hours. . You may resume light activities as tolerated. . If you had Spine Injection, you should not drive for the next 3 hours due to anesthetics used in the procedure. Please have someone drive for you.  *If you have had sedation, Valium, Xanax, or lorazepam: Do not drive or use public transportation for 24 hours, do not operating hazardous machinery or make important personal/business decisions for 24 hours.  POSSIBLE STEROID SIDE EFFECTS: If experienced these should only last for a short period. Change in menstrual flow  Edema in (swelling)  Increased appetite Skin flushing (redness)  Skin rash/acne  Thrush (oral) Vaginitis    Increased sweating  Depression Increased blood glucose levels Cramping and leg/calf  Euphoria (feeling happy)  POSSIBLE PROCEDURE SIDE EFFECTS: Please call our office if concerned. Increased pain Increased numbness/tingling  Headache Nausea/vomiting Hematoma (bruising/bleeding) Edema (swelling at the site) Weakness  Infection (red/drainage at site) Fever greater than 100.12F  *In the event of a headache after epidural steroid injection: Drink plenty of fluids, especially water and try to lay flat when possible. If the headache does not get better after a few days or as always if concerned please call the office.

## 2017-07-05 NOTE — Progress Notes (Deleted)
Pt states a sharp constant pain in lower back. Pt states pain has been going on for about 1 week. Pt states bending makes pain worse, heating pad makes pain worse. +Driver, -BT, -Dye Allergies.

## 2017-07-06 ENCOUNTER — Telehealth (INDEPENDENT_AMBULATORY_CARE_PROVIDER_SITE_OTHER): Payer: Self-pay | Admitting: Physical Medicine and Rehabilitation

## 2017-07-10 NOTE — Procedures (Signed)
[name] is a 72 year old female with chronic worsening sharp lower back pain that does radiate to the right thigh to some degree with feels like may be paresthesia but no frank radicular complaints.  She reports this is been ongoing for 10 years or more.  [name] has been following her from a spine standpoint is recent obtain MRI of the lumbar spine which is reviewed below.  She reports that bending from sit to stand and gives her a lot of symptoms.  Reports using the heating pad.  Medications have helped but have not alleviated the problem.  He has had physical therapy in the past.  [name] requested diagnostic medial branch blocks with looking forward to radiofrequency ablation.    We have completed 1 set of diagnostic medial branch blocks on 06/13/2017 she reports 100% relief post injection and for 3 hours post injection.  At about the fourth hour she reported 75% relief and then diminishing after that.  We did use Marcaine.  We are going to complete his second diagnostic block today.  She has had difficulty returning the pain diary but does call in and report her results.  These are documented.  Lumbar Diagnostic Facet Joint Nerve Block with Fluoroscopic Guidance   Patient: Patricia Sutton      Date of Birth: Nov 20, 1945 MRN: [id number] PCP: Patient, No Pcp Per      Visit Date: 07/05/2017   Universal Protocol:    Date/Time: 02/12/194:53 AM  Consent Given By: the patient  Position: PRONE  Additional Comments: Vital signs were monitored before and after the procedure. Patient was prepped and draped in the usual sterile fashion. The correct patient, procedure, and site was verified.   Injection Procedure Details:  Procedure Site One Meds Administered:  Meds ordered this encounter  Medications  . bupivacaine (MARCAINE) 0.5 % (with pres) injection 3 mL     Laterality: Bilateral  Location/Site:  L4-L5 L5-S1  Needle size: 22 ga.  Needle type:spinal  Needle Placement: Oblique  pedical  Findings:   -Comments: There was excellent flow of contrast along the articular pillars without intravascular flow.  Procedure Details: The fluoroscope beam is vertically oriented in AP and then obliqued 15 to 20 degrees to the ipsilateral side of the desired nerve to achieve the "Scotty dog" appearance.  The skin over the target area of the junction of the superior articulating process and the transverse process (sacral ala if blocking the L5 dorsal rami) was locally anesthetized with a 1 ml volume of 1% Lidocaine without Epinephrine.  The spinal needle was inserted and advanced in a trajectory view down to the target.   After contact with periosteum and negative aspirate for blood and CSF, correct placement without intravascular or epidural spread was confirmed by injecting 0.5 ml. of Isovue-250.  A spot radiograph was obtained of this image.    Next, a 0.5 ml. volume of the injectate described above was injected. The needle was then redirected to the other facet joint nerves mentioned above if needed.  Prior to the procedure, the patient was given a Pain Diary which was completed for baseline measurements.  After the procedure, the patient rated their pain every 30 minutes and will continue rating at this frequency for a total of 5 hours.  The patient has been asked to complete the Diary and return to [place] by mail, fax or hand delivered as soon as possible.   Additional Comments:  The patient tolerated the procedure well Dressing: Band-Aid  Post-procedure details: Patient was observed during the procedure. Post-procedure instructions were reviewed.  Patient left the clinic in stable condition.  Pertinent Imaging: MRI LUMBAR SPINE WITHOUT AND WITH CONTRAST  TECHNIQUE: Multiplanar and multiecho pulse sequences of the lumbar spine were obtained without and with intravenous contrast.  CONTRAST:  11 mL MultiHance  COMPARISON:  09/06/2014  FINDINGS: Segmentation:   Standard.  Alignment:  Normal.  Vertebrae: Similar appearance of mild diffuse bone marrow heterogeneity without destructive osseous lesion. A discrete 7 mm T1 hypointense focus in the L3 vertebral body is unchanged and likely benign. No fracture or significant marrow edema.  Conus medullaris and cauda equina: Conus extends to the L1 level. Conus and cauda equina appear normal.  Paraspinal and other soft tissues: Unremarkable.  Disc levels:  Disc desiccation throughout the lumbar spine, greatest at L4-5. Disc desiccation and disc space narrowing at T12-L1.  T12-L1: Small central to left paracentral disc protrusion without stenosis, unchanged.  L1-2:  Negative.  L2-3: Mild right and mild-to-moderate left facet arthrosis without disc herniation or stenosis, similar to prior.  L3-4: Minimal disc bulging and moderate left greater than right facet hypertrophy without stenosis, unchanged.  L4-5: Minimal disc bulging and mild right and moderate left facet hypertrophy result in mild left lateral recess stenosis without spinal or neural foraminal stenosis, unchanged.  L5-S1: Moderate facet arthrosis without disc herniation or stenosis, unchanged.  IMPRESSION: Unchanged lumbar spondylosis and moderate facet arthrosis without high-grade stenosis. Mild left lateral recess stenosis at L4-5.   Electronically Signed   By: Sebastian AcheAllen  Grady M.D.   On: 05/11/2017 17:04

## 2017-07-10 NOTE — Telephone Encounter (Signed)
[name] is a 72 year old female with chronic worsening sharp lower back pain  but no frank radicular complaints.  She reports this is been ongoing for 10 years or more.  Dr. Otelia SergeantNitka has been following her from a spine standpoint is recent obtain MRI of the lumbar spine which shows mainly spondylosis is reviewed in her notes.  She reports that bending from sit to stand and gives her a lot of symptoms.  Reports using the heating pad.  Medications have helped but have not alleviated the problem.  Uses Lyrica and oxycodone as well as Robaxin and Mobic. She has had physical therapy in the past.    We have now completed double diagnostic medial branch blocks of the L4-5 and L5-S1 facet joints.  These were completed on 06/13/2017 and 07/05/2017.  She has had difficulty returning the pain diary but has called in on both occasions.  She basically has gotten 100% relief of the first 2-3 hours and then diminishing relief after that.  She is pretty pleased with the results of the medial branch blocks.  We will proceed with preauthorization of lumbar radiofrequency ablation of the same joints.

## 2017-07-11 NOTE — Telephone Encounter (Signed)
Needs auth for RFA. 

## 2017-07-11 NOTE — Telephone Encounter (Signed)
She needs pre-auth RFA

## 2017-07-13 ENCOUNTER — Ambulatory Visit (INDEPENDENT_AMBULATORY_CARE_PROVIDER_SITE_OTHER): Payer: Medicare Other | Admitting: Specialist

## 2017-07-16 ENCOUNTER — Telehealth (INDEPENDENT_AMBULATORY_CARE_PROVIDER_SITE_OTHER): Payer: Self-pay | Admitting: Physical Medicine and Rehabilitation

## 2017-08-07 ENCOUNTER — Ambulatory Visit (INDEPENDENT_AMBULATORY_CARE_PROVIDER_SITE_OTHER): Payer: Self-pay

## 2017-08-07 ENCOUNTER — Ambulatory Visit (INDEPENDENT_AMBULATORY_CARE_PROVIDER_SITE_OTHER): Payer: Medicare Other | Admitting: Physical Medicine and Rehabilitation

## 2017-08-07 ENCOUNTER — Encounter (INDEPENDENT_AMBULATORY_CARE_PROVIDER_SITE_OTHER): Payer: Self-pay | Admitting: Physical Medicine and Rehabilitation

## 2017-08-07 VITALS — BP 145/73

## 2017-08-07 DIAGNOSIS — M47816 Spondylosis without myelopathy or radiculopathy, lumbar region: Secondary | ICD-10-CM

## 2017-08-07 MED ORDER — METHYLPREDNISOLONE ACETATE 80 MG/ML IJ SUSP
80.0000 mg | Freq: Once | INTRAMUSCULAR | Status: AC
  Administered 2017-08-07: 80 mg

## 2017-08-07 NOTE — Progress Notes (Addendum)
 .  Numeric Pain Rating Scale and Functional Assessment Average Pain 8   In the last MONTH (on 0-10 scale) has pain interfered with the following?  1. General activity like being  able to carry out your everyday physical activities such as walking, climbing stairs, carrying groceries, or moving a chair?  Rating(8)   +Driver, -BT, -Dye Allergies.  

## 2017-08-07 NOTE — Patient Instructions (Signed)

## 2017-08-08 ENCOUNTER — Other Ambulatory Visit (INDEPENDENT_AMBULATORY_CARE_PROVIDER_SITE_OTHER): Payer: Self-pay | Admitting: Specialist

## 2017-08-08 NOTE — Telephone Encounter (Signed)
Patient called asking for a refill on her tramadol. CB # [phone]

## 2017-08-08 NOTE — Progress Notes (Addendum)
Patricia Sutton - 72 y.o. female MRN [id number]  Date of birth: 05-Dec-1945  Office Visit Note: Visit Date: 08/07/2017 PCP: Patient, No Pcp Per Referred by: Kerrin Champagne, MD  Subjective: Chief Complaint  Patient presents with  . Lower Back - Pain  . Right Leg - Pain   HPI: [name] is a 72 year old female with chronic worsening severe axial low back pain right worse than left.  She comes in today for planned radiofrequency ablation of the right L4-5 and L5-S1 facet joints.  Please see our prior evaluation and management note for further details and justification.    ROS Otherwise per HPI.  Assessment & Plan: Visit Diagnoses:  1. Spondylosis without myelopathy or radiculopathy, lumbar region     Plan: No additional findings.   Meds & Orders:  Meds ordered this encounter  Medications  . methylPREDNISolone acetate (DEPO-MEDROL) injection 80 mg    Orders Placed This Encounter  Procedures  . Radiofrequency,Lumbar  . XR C-ARM NO REPORT    Follow-up: Return if symptoms worsen or fail to improve.   Procedures: No procedures performed  Lumbar Facet Joint Nerve Denervation  Patient: Patricia Sutton      Date of Birth: 1946/03/15 MRN: [id number] PCP: Patient, No Pcp Per      Visit Date: 08/07/2017   Universal Protocol:    Date/Time: 03/13/195:53 AM  Consent Given By: the patient  Position: PRONE  Additional Comments: Vital signs were monitored before and after the procedure. Patient was prepped and draped in the usual sterile fashion. The correct patient, procedure, and site was verified.   Injection Procedure Details:  Procedure Site One Meds Administered:  Meds ordered this encounter  Medications  . methylPREDNISolone acetate (DEPO-MEDROL) injection 80 mg     Laterality: Right  Location/Site: Right L3 and L4 medial branches and L5 dorsal rami L4-L5 L5-S1  Needle size: 18 G  Needle type: Radiofrequency cannula  Needle Placement: Along juncture of  superior articular process and transverse pocess  Findings:  -Comments:  Procedure Details: For each desired target nerve, the corresponding transverse process (sacral ala for the L5 dorsal rami) was identified and the fluoroscope was positioned to square off the endplates of the corresponding vertebral body to achieve a true AP midline view.  The beam was then obliqued 15 to 20 degrees and caudally tilted 15 to 20 degrees to line up a trajectory along the target nerves. The skin over the target of the junction of superior articulating process and transverse process (sacral ala for the L5 dorsal rami) was infiltrated with 1ml of 1% Lidocaine without Epinephrine.  The 18 gauge 10mm active tip outer cannula was advanced in trajectory view to the target.  This procedure was repeated for each target nerve.  Then, for all levels, the outer cannula placement was fine-tuned and the position was then confirmed with bi-planar imaging.    Test stimulation was done both at sensory and motor levels to ensure there was no radicular stimulation. The target tissues were then infiltrated with 1 ml of 1% Lidocaine without Epinephrine. Subsequently, a percutaneous neurotomy was carried out for 60 seconds at 80 degrees Celsius. The procedure was repeated with the cannula rotated 90 degrees, for duration of 60 seconds, one additional time at each level for a total of two lesions per level.  After the completion of the two lesions, 1 ml of injectate was delivered. It was then repeated for each facet joint nerve mentioned above. Appropriate radiographs were obtained  to verify the probe placement during the neurotomy.   Additional Comments:  The patient tolerated the procedure well Dressing: Band-Aid    Post-procedure details: Patient was observed during the procedure. Post-procedure instructions were reviewed.  Patient left the clinic in stable condition.      Clinical History: MRI LUMBAR SPINE WITHOUT AND  WITH CONTRAST  TECHNIQUE: Multiplanar and multiecho pulse sequences of the lumbar spine were obtained without and with intravenous contrast.  CONTRAST:  11 mL MultiHance  COMPARISON:  09/06/2014  FINDINGS: Segmentation:  Standard.  Alignment:  Normal.  Vertebrae: Similar appearance of mild diffuse bone marrow heterogeneity without destructive osseous lesion. A discrete 7 mm T1 hypointense focus in the L3 vertebral body is unchanged and likely benign. No fracture or significant marrow edema.  Conus medullaris and cauda equina: Conus extends to the L1 level. Conus and cauda equina appear normal.  Paraspinal and other soft tissues: Unremarkable.  Disc levels:  Disc desiccation throughout the lumbar spine, greatest at L4-5. Disc desiccation and disc space narrowing at T12-L1.  T12-L1: Small central to left paracentral disc protrusion without stenosis, unchanged.  L1-2:  Negative.  L2-3: Mild right and mild-to-moderate left facet arthrosis without disc herniation or stenosis, similar to prior.  L3-4: Minimal disc bulging and moderate left greater than right facet hypertrophy without stenosis, unchanged.  L4-5: Minimal disc bulging and mild right and moderate left facet hypertrophy result in mild left lateral recess stenosis without spinal or neural foraminal stenosis, unchanged.  L5-S1: Moderate facet arthrosis without disc herniation or stenosis, unchanged.  IMPRESSION: Unchanged lumbar spondylosis and moderate facet arthrosis without high-grade stenosis. Mild left lateral recess stenosis at L4-5.   Electronically Signed   By: Sebastian AcheAllen  Grady M.D.   On: 05/11/2017 17:04   She reports that she has been smoking cigarettes.  She has a 40.00 pack-year smoking history. she has never used smokeless tobacco. No results for input(s): HGBA1C, LABURIC in the last 8760 hours.  Objective:  VS:  HT:    WT:   BMI:     BP:(!) 145/73  HR: bpm  TEMP: ( )   RESP:  Physical Exam  Ortho Exam Imaging: Xr C-arm No Report  Result Date: 08/07/2017 Please see Notes or Procedures tab for imaging impression.   Past Medical/Family/Surgical/Social History: Medications & Allergies reviewed per EMR, new medications updated. Patient Active Problem List   Diagnosis Date Noted  . Tobacco use disorder 03/15/2015  . UTI (lower urinary tract infection) 03/15/2015  . COPD (chronic obstructive pulmonary disease) (HCC) 03/15/2015  . Hypothyroidism 03/15/2015  . Chronic pain syndrome 03/15/2015  . Dehydration 03/15/2015  . Elevated troponin 03/14/2015  . Osteoarthritis of right knee 07/18/2013    Class: Chronic  . Loose body of right knee 07/16/2012    Class: Chronic   Past Medical History:  Diagnosis Date  . Anxiety   . Arthritis   . Asthma   . Back pain, chronic   . Bone spur of other site    spine  . Chronic back pain    spurs on spine  . COPD (chronic obstructive pulmonary disease) (HCC)    denies SOB  . Depression    takes Effexor daily  . GERD (gastroesophageal reflux disease)    takes Omeprazole daily  . History of bronchitis    last time about 6+yrs ago  . History of colon polyps   . History of kidney stones   . Hypothyroidism    takes Synthroid daily  . Insomnia  takes Trazodone nightly and Ambien  . Joint pain   . Joint swelling   . Nocturia   . Pneumonia    hx of;last time about 6+yrs ago  . PONV (postoperative nausea and vomiting)   . Renal insufficiency   . Sleep apnea    no CPAP; uses O2 at night 1l/min.  Marland Kitchen Urinary urgency    Family History  Problem Relation Age of Onset  . Heart attack Mother   . Stroke Mother   . Diabetes Mellitus II Mother   . Lymphoma Father    Past Surgical History:  Procedure Laterality Date  . ABDOMINAL HYSTERECTOMY     complete  . bladder tacked    . COLONOSCOPY    . ESOPHAGOGASTRODUODENOSCOPY    . KNEE ARTHROPLASTY Right 07/18/2013   Procedure: COMPUTER ASSISTED RIGHT TOTAL  KNEE ARTHROPLASTY;  Surgeon: Kerrin Champagne, MD;  Location: [facility] OR;  Service: Orthopedics;  Laterality: Right;  . KNEE ARTHROSCOPY Right 07/16/2012   Procedure: ARTHROSCOPY KNEE;  Surgeon: Kerrin Champagne, MD;  Location: [facility];  Service: Orthopedics;  Laterality: Right;  Right knee arthroscopy with chondroplastic shaving of retropatella surface   Social History   Occupational History  . Not on file  Tobacco Use  . Smoking status: Current Some Day Smoker    Packs/day: 1.00    Years: 40.00    Pack years: 40.00    Types: Cigarettes  . Smokeless tobacco: Never Used  Substance and Sexual Activity  . Alcohol use: No  . Drug use: No  . Sexual activity: Not on file

## 2017-08-08 NOTE — Procedures (Signed)
Lumbar Facet Joint Nerve Denervation  Patient: Patricia [name]      Date of Birth: 1945-09-06 MRN: [id number] PCP: Patient, No Pcp Per      Visit Date: 08/07/2017   Universal Protocol:    Date/Time: 03/13/195:53 AM  Consent Given By: the patient  Position: PRONE  Additional Comments: Vital signs were monitored before and after the procedure. Patient was prepped and draped in the usual sterile fashion. The correct patient, procedure, and site was verified.   Injection Procedure Details:  Procedure Site One Meds Administered:  Meds ordered this encounter  Medications  . methylPREDNISolone acetate (DEPO-MEDROL) injection 80 mg     Laterality: Right  Location/Site: Right L3 and L4 medial branches and L5 dorsal rami L4-L5 L5-S1  Needle size: 18 G  Needle type: Radiofrequency cannula  Needle Placement: Along juncture of superior articular process and transverse pocess  Findings:  -Comments:  Procedure Details: For each desired target nerve, the corresponding transverse process (sacral ala for the L5 dorsal rami) was identified and the fluoroscope was positioned to square off the endplates of the corresponding vertebral body to achieve a true AP midline view.  The beam was then obliqued 15 to 20 degrees and caudally tilted 15 to 20 degrees to line up a trajectory along the target nerves. The skin over the target of the junction of superior articulating process and transverse process (sacral ala for the L5 dorsal rami) was infiltrated with 1ml of 1% Lidocaine without Epinephrine.  The 18 gauge 10mm active tip outer cannula was advanced in trajectory view to the target.  This procedure was repeated for each target nerve.  Then, for all levels, the outer cannula placement was fine-tuned and the position was then confirmed with bi-planar imaging.    Test stimulation was done both at sensory and motor levels to ensure there was no radicular stimulation. The target tissues were then  infiltrated with 1 ml of 1% Lidocaine without Epinephrine. Subsequently, a percutaneous neurotomy was carried out for 60 seconds at 80 degrees Celsius. The procedure was repeated with the cannula rotated 90 degrees, for duration of 60 seconds, one additional time at each level for a total of two lesions per level.  After the completion of the two lesions, 1 ml of injectate was delivered. It was then repeated for each facet joint nerve mentioned above. Appropriate radiographs were obtained to verify the probe placement during the neurotomy.   Additional Comments:  The patient tolerated the procedure well Dressing: Band-Aid    Post-procedure details: Patient was observed during the procedure. Post-procedure instructions were reviewed.  Patient left the clinic in stable condition.

## 2017-08-08 NOTE — Addendum Note (Signed)
Addended by: Penne LashSHUE WILLS, Otis DialsHRISTY N on: 08/08/2017 04:47 PM   Modules accepted: Orders

## 2017-08-10 MED ORDER — TRAMADOL HCL 50 MG PO TABS: 50.0000 mg | ORAL_TABLET | Freq: Four times a day (QID) | ORAL | 3 refills | Status: DC | PRN

## 2017-08-10 NOTE — Telephone Encounter (Signed)
I called rx into [facility], I called and advised patient the rx was called in to her pharm

## 2017-08-21 ENCOUNTER — Ambulatory Visit (INDEPENDENT_AMBULATORY_CARE_PROVIDER_SITE_OTHER): Payer: Medicare Other

## 2017-08-21 ENCOUNTER — Ambulatory Visit (INDEPENDENT_AMBULATORY_CARE_PROVIDER_SITE_OTHER): Payer: Medicare Other | Admitting: Physical Medicine and Rehabilitation

## 2017-08-21 ENCOUNTER — Encounter (INDEPENDENT_AMBULATORY_CARE_PROVIDER_SITE_OTHER): Payer: Self-pay | Admitting: Physical Medicine and Rehabilitation

## 2017-08-21 VITALS — BP 155/78

## 2017-08-21 DIAGNOSIS — M47816 Spondylosis without myelopathy or radiculopathy, lumbar region: Secondary | ICD-10-CM | POA: Diagnosis not present

## 2017-08-21 MED ORDER — METHYLPREDNISOLONE ACETATE 80 MG/ML IJ SUSP: 80.0000 mg | Freq: Once | INTRAMUSCULAR | Status: DC

## 2017-08-21 NOTE — Progress Notes (Signed)
 .  Numeric Pain Rating Scale and Functional Assessment Average Pain 2   In the last MONTH (on 0-10 scale) has pain interfered with the following?  1. General activity like being  able to carry out your everyday physical activities such as walking, climbing stairs, carrying groceries, or moving a chair?  Rating(1)   +Driver, -BT, -Dye Allergies.  

## 2017-08-21 NOTE — Patient Instructions (Signed)

## 2017-08-28 ENCOUNTER — Other Ambulatory Visit (INDEPENDENT_AMBULATORY_CARE_PROVIDER_SITE_OTHER): Payer: Self-pay | Admitting: Specialist

## 2017-08-28 NOTE — Telephone Encounter (Signed)
Nabumetone refill request 

## 2017-08-30 NOTE — Telephone Encounter (Signed)
Completed.

## 2017-08-30 NOTE — Procedures (Signed)
Lumbar Facet Joint Nerve Denervation  Patient: Patricia [name]      Date of Birth: 11-04-1945 MRN: [id number] PCP: Patient, No Pcp Per      Visit Date: 08/21/2017   Universal Protocol:    Date/Time: 04/04/195:34 AM  Consent Given By: the patient  Position: PRONE  Additional Comments: Vital signs were monitored before and after the procedure. Patient was prepped and draped in the usual sterile fashion. The correct patient, procedure, and site was verified.   Injection Procedure Details:  Procedure Site One Meds Administered:  Meds ordered this encounter  Medications  . methylPREDNISolone acetate (DEPO-MEDROL) injection 80 mg     Laterality: Left  Location/Site: Left L3, L4 medial branches and left L5 dorsal rami L4-L5 L5-S1  Needle size: 18 G  Needle type: Radiofrequency cannula  Needle Placement: Along juncture of superior articular process and transverse pocess  Findings:  -Comments:  Procedure Details: For each desired target nerve, the corresponding transverse process (sacral ala for the L5 dorsal rami) was identified and the fluoroscope was positioned to square off the endplates of the corresponding vertebral body to achieve a true AP midline view.  The beam was then obliqued 15 to 20 degrees and caudally tilted 15 to 20 degrees to line up a trajectory along the target nerves. The skin over the target of the junction of superior articulating process and transverse process (sacral ala for the L5 dorsal rami) was infiltrated with 1ml of 1% Lidocaine without Epinephrine.  The 18 gauge 10mm active tip outer cannula was advanced in trajectory view to the target.  This procedure was repeated for each target nerve.  Then, for all levels, the outer cannula placement was fine-tuned and the position was then confirmed with bi-planar imaging.    Test stimulation was done both at sensory and motor levels to ensure there was no radicular stimulation. The target tissues were then  infiltrated with 1 ml of 1% Lidocaine without Epinephrine. Subsequently, a percutaneous neurotomy was carried out for 60 seconds at 80 degrees Celsius. The procedure was repeated with the cannula rotated 90 degrees, for duration of 60 seconds, one additional time at each level for a total of two lesions per level.  After the completion of the two lesions, 1 ml of injectate was delivered. It was then repeated for each facet joint nerve mentioned above. Appropriate radiographs were obtained to verify the probe placement during the neurotomy.   Additional Comments:  The patient tolerated the procedure well Dressing: Band-Aid    Post-procedure details: Patient was observed during the procedure. Post-procedure instructions were reviewed.  Patient left the clinic in stable condition.

## 2017-08-30 NOTE — Progress Notes (Signed)
DORENA DORFMAN - 72 y.o. female MRN [id number]  Date of birth: 12-10-1945  Office Visit Note: Visit Date: 08/21/2017 PCP: Patient, No Pcp Per Referred by: No ref. provider found  Subjective: Chief Complaint  Patient presents with  . Lower Back - Pain   HPI: Mrs. Brand is a 72 year old female that comes in today for left L4-5 and L5-S1 radiofrequency ablation.  Please see our prior notes for further justification.   ROS Otherwise per HPI.  Assessment & Plan: Visit Diagnoses:  1. Spondylosis without myelopathy or radiculopathy, lumbar region     Plan: No additional findings.   Meds & Orders:  Meds ordered this encounter  Medications  . methylPREDNISolone acetate (DEPO-MEDROL) injection 80 mg    Orders Placed This Encounter  Procedures  . Radiofrequency,Lumbar  . XR C-ARM NO REPORT    Follow-up: Return in about 1 month (around 09/18/2017) for [name].   Procedures: No procedures performed  Lumbar Facet Joint Nerve Denervation  Patient: ZARRA GEFFERT      Date of Birth: [birth year] MRN: [id number] PCP: Patient, No Pcp Per      Visit Date: 08/21/2017   Universal Protocol:    Date/Time: 04/04/195:34 AM  Consent Given By: the patient  Position: PRONE  Additional Comments: Vital signs were monitored before and after the procedure. Patient was prepped and draped in the usual sterile fashion. The correct patient, procedure, and site was verified.   Injection Procedure Details:  Procedure Site One Meds Administered:  Meds ordered this encounter  Medications  . methylPREDNISolone acetate (DEPO-MEDROL) injection 80 mg     Laterality: Left  Location/Site: Left L3, L4 medial branches and left L5 dorsal rami L4-L5 L5-S1  Needle size: 18 G  Needle type: Radiofrequency cannula  Needle Placement: Along juncture of superior articular process and transverse pocess  Findings:  -Comments:  Procedure Details: For each desired target nerve, the corresponding  transverse process (sacral ala for the L5 dorsal rami) was identified and the fluoroscope was positioned to square off the endplates of the corresponding vertebral body to achieve a true AP midline view.  The beam was then obliqued 15 to 20 degrees and caudally tilted 15 to 20 degrees to line up a trajectory along the target nerves. The skin over the target of the junction of superior articulating process and transverse process (sacral ala for the L5 dorsal rami) was infiltrated with 1ml of 1% Lidocaine without Epinephrine.  The 18 gauge 10mm active tip outer cannula was advanced in trajectory view to the target.  This procedure was repeated for each target nerve.  Then, for all levels, the outer cannula placement was fine-tuned and the position was then confirmed with bi-planar imaging.    Test stimulation was done both at sensory and motor levels to ensure there was no radicular stimulation. The target tissues were then infiltrated with 1 ml of 1% Lidocaine without Epinephrine. Subsequently, a percutaneous neurotomy was carried out for 60 seconds at 80 degrees Celsius. The procedure was repeated with the cannula rotated 90 degrees, for duration of 60 seconds, one additional time at each level for a total of two lesions per level.  After the completion of the two lesions, 1 ml of injectate was delivered. It was then repeated for each facet joint nerve mentioned above. Appropriate radiographs were obtained to verify the probe placement during the neurotomy.   Additional Comments:  The patient tolerated the procedure well Dressing: Band-Aid    Post-procedure details: Patient was  observed during the procedure. Post-procedure instructions were reviewed.  Patient left the clinic in stable condition.      Clinical History: MRI LUMBAR SPINE WITHOUT AND WITH CONTRAST  TECHNIQUE: Multiplanar and multiecho pulse sequences of the lumbar spine were obtained without and with intravenous  contrast.  CONTRAST:  11 mL MultiHance  COMPARISON:  09/06/2014  FINDINGS: Segmentation:  Standard.  Alignment:  Normal.  Vertebrae: Similar appearance of mild diffuse bone marrow heterogeneity without destructive osseous lesion. A discrete 7 mm T1 hypointense focus in the L3 vertebral body is unchanged and likely benign. No fracture or significant marrow edema.  Conus medullaris and cauda equina: Conus extends to the L1 level. Conus and cauda equina appear normal.  Paraspinal and other soft tissues: Unremarkable.  Disc levels:  Disc desiccation throughout the lumbar spine, greatest at L4-5. Disc desiccation and disc space narrowing at T12-L1.  T12-L1: Small central to left paracentral disc protrusion without stenosis, unchanged.  L1-2:  Negative.  L2-3: Mild right and mild-to-moderate left facet arthrosis without disc herniation or stenosis, similar to prior.  L3-4: Minimal disc bulging and moderate left greater than right facet hypertrophy without stenosis, unchanged.  L4-5: Minimal disc bulging and mild right and moderate left facet hypertrophy result in mild left lateral recess stenosis without spinal or neural foraminal stenosis, unchanged.  L5-S1: Moderate facet arthrosis without disc herniation or stenosis, unchanged.  IMPRESSION: Unchanged lumbar spondylosis and moderate facet arthrosis without high-grade stenosis. Mild left lateral recess stenosis at L4-5.   Electronically Signed   By: Sebastian Ache M.D.   On: 05/11/2017 17:04   She reports that she has been smoking cigarettes.  She has a 40.00 pack-year smoking history. She has never used smokeless tobacco. No results for input(s): HGBA1C, LABURIC in the last 8760 hours.  Objective:  VS:  HT:    WT:   BMI:     BP:(!) 155/78  HR: bpm  TEMP: ( )  RESP:  Physical Exam  Ortho Exam Imaging: No results found.  Past Medical/Family/Surgical/Social History: Medications & Allergies  reviewed per EMR, new medications updated. Patient Active Problem List   Diagnosis Date Noted  . Tobacco use disorder 03/15/2015  . UTI (lower urinary tract infection) 03/15/2015  . COPD (chronic obstructive pulmonary disease) (HCC) 03/15/2015  . Hypothyroidism 03/15/2015  . Chronic pain syndrome 03/15/2015  . Dehydration 03/15/2015  . Elevated troponin 03/14/2015  . Osteoarthritis of right knee 07/18/2013    Class: Chronic  . Loose body of right knee 07/16/2012    Class: Chronic   Past Medical History:  Diagnosis Date  . Anxiety   . Arthritis   . Asthma   . Back pain, chronic   . Bone spur of other site    spine  . Chronic back pain    spurs on spine  . COPD (chronic obstructive pulmonary disease) (HCC)    denies SOB  . Depression    takes Effexor daily  . GERD (gastroesophageal reflux disease)    takes Omeprazole daily  . History of bronchitis    last time about 6+yrs ago  . History of colon polyps   . History of kidney stones   . Hypothyroidism    takes Synthroid daily  . Insomnia    takes Trazodone nightly and Ambien  . Joint pain   . Joint swelling   . Nocturia   . Pneumonia    hx of;last time about 6+yrs ago  . PONV (postoperative nausea and vomiting)   .  Renal insufficiency   . Sleep apnea    no CPAP; uses O2 at night 1l/min.  Marland Kitchen. Urinary urgency    Family History  Problem Relation Age of Onset  . Heart attack Mother   . Stroke Mother   . Diabetes Mellitus II Mother   . Lymphoma Father    Past Surgical History:  Procedure Laterality Date  . ABDOMINAL HYSTERECTOMY     complete  . bladder tacked    . COLONOSCOPY    . ESOPHAGOGASTRODUODENOSCOPY    . KNEE ARTHROPLASTY Right 07/18/2013   Procedure: COMPUTER ASSISTED RIGHT TOTAL KNEE ARTHROPLASTY;  Surgeon: Kerrin ChampagneJames E Nitka, MD;  Location: [facility] OR;  Service: Orthopedics;  Laterality: Right;  . KNEE ARTHROSCOPY Right 07/16/2012   Procedure: ARTHROSCOPY KNEE;  Surgeon: Kerrin ChampagneJames E Nitka, MD;  Location: [facility];  Service: Orthopedics;  Laterality: Right;  Right knee arthroscopy with chondroplastic shaving of retropatella surface   Social History   Occupational History  . Not on file  Tobacco Use  . Smoking status: Current Some Day Smoker    Packs/day: 1.00    Years: 40.00    Pack years: 40.00    Types: Cigarettes  . Smokeless tobacco: Never Used  Substance and Sexual Activity  . Alcohol use: No  . Drug use: No  . Sexual activity: Not on file

## 2017-09-26 ENCOUNTER — Emergency Department (HOSPITAL_BASED_OUTPATIENT_CLINIC_OR_DEPARTMENT_OTHER): Payer: Medicare Other

## 2017-09-26 ENCOUNTER — Emergency Department (HOSPITAL_BASED_OUTPATIENT_CLINIC_OR_DEPARTMENT_OTHER)
Admission: EM | Admit: 2017-09-26 | Discharge: 2017-09-26 | Disposition: A | Discharge status: Home / Self Care | Payer: Medicare Other | Attending: Emergency Medicine | Admitting: Emergency Medicine

## 2017-09-26 ENCOUNTER — Other Ambulatory Visit: Payer: Self-pay

## 2017-09-26 ENCOUNTER — Encounter (HOSPITAL_BASED_OUTPATIENT_CLINIC_OR_DEPARTMENT_OTHER): Payer: Self-pay | Admitting: *Deleted

## 2017-09-26 DIAGNOSIS — K5900 Constipation, unspecified: Secondary | ICD-10-CM | POA: Diagnosis present

## 2017-09-26 DIAGNOSIS — R1010 Upper abdominal pain, unspecified: Secondary | ICD-10-CM | POA: Diagnosis not present

## 2017-09-26 DIAGNOSIS — Z7982 Long term (current) use of aspirin: Secondary | ICD-10-CM | POA: Insufficient documentation

## 2017-09-26 DIAGNOSIS — J189 Pneumonia, unspecified organism: Secondary | ICD-10-CM | POA: Insufficient documentation

## 2017-09-26 DIAGNOSIS — F419 Anxiety disorder, unspecified: Secondary | ICD-10-CM | POA: Diagnosis not present

## 2017-09-26 DIAGNOSIS — F17210 Nicotine dependence, cigarettes, uncomplicated: Secondary | ICD-10-CM | POA: Diagnosis not present

## 2017-09-26 DIAGNOSIS — F329 Major depressive disorder, single episode, unspecified: Secondary | ICD-10-CM | POA: Diagnosis not present

## 2017-09-26 DIAGNOSIS — Z96651 Presence of right artificial knee joint: Secondary | ICD-10-CM | POA: Diagnosis not present

## 2017-09-26 DIAGNOSIS — Z79899 Other long term (current) drug therapy: Secondary | ICD-10-CM | POA: Diagnosis not present

## 2017-09-26 DIAGNOSIS — J181 Lobar pneumonia, unspecified organism: Secondary | ICD-10-CM

## 2017-09-26 DIAGNOSIS — E039 Hypothyroidism, unspecified: Secondary | ICD-10-CM | POA: Insufficient documentation

## 2017-09-26 DIAGNOSIS — J45909 Unspecified asthma, uncomplicated: Secondary | ICD-10-CM | POA: Insufficient documentation

## 2017-09-26 LAB — CBC WITH DIFFERENTIAL/PLATELET
BASOS ABS: 0 10*3/uL (ref 0.0–0.1)
BASOS PCT: 0 %
Eosinophils Absolute: 0.1 10*3/uL (ref 0.0–0.7)
Eosinophils Relative: 1 %
HCT: 38.7 % (ref 36.0–46.0)
HEMOGLOBIN: 13.1 g/dL (ref 12.0–15.0)
LYMPHS PCT: 25 %
Lymphs Abs: 3.6 10*3/uL (ref 0.7–4.0)
MCH: 31.6 pg (ref 26.0–34.0)
MCHC: 33.9 g/dL (ref 30.0–36.0)
MCV: 93.5 fL (ref 78.0–100.0)
Monocytes Absolute: 1.4 10*3/uL — ABNORMAL HIGH (ref 0.1–1.0)
Monocytes Relative: 10 %
NEUTROS ABS: 9.4 10*3/uL — AB (ref 1.7–7.7)
NEUTROS PCT: 64 %
Platelets: 261 10*3/uL (ref 150–400)
RBC: 4.14 MIL/uL (ref 3.87–5.11)
RDW: 15.1 % (ref 11.5–15.5)
WBC: 14.4 10*3/uL — ABNORMAL HIGH (ref 4.0–10.5)

## 2017-09-26 LAB — COMPREHENSIVE METABOLIC PANEL
ALBUMIN: 3.3 g/dL — AB (ref 3.5–5.0)
ALT: 16 U/L (ref 14–54)
AST: 18 U/L (ref 15–41)
Alkaline Phosphatase: 83 U/L (ref 38–126)
Anion gap: 8 (ref 5–15)
BILIRUBIN TOTAL: 0.5 mg/dL (ref 0.3–1.2)
BUN: 12 mg/dL (ref 6–20)
CALCIUM: 8.5 mg/dL — AB (ref 8.9–10.3)
CO2: 27 mmol/L (ref 22–32)
Chloride: 106 mmol/L (ref 101–111)
Creatinine, Ser: 0.75 mg/dL (ref 0.44–1.00)
GFR calc non Af Amer: >60 mL/min (ref >60)
Glucose, Bld: 79 mg/dL (ref 65–99)
POTASSIUM: 3.3 mmol/L — AB (ref 3.5–5.1)
Sodium: 141 mmol/L (ref 135–145)
TOTAL PROTEIN: 6.7 g/dL (ref 6.5–8.1)

## 2017-09-26 LAB — LIPASE, BLOOD: Lipase: 25 U/L (ref 11–51)

## 2017-09-26 LAB — URINALYSIS, ROUTINE W REFLEX MICROSCOPIC
Bilirubin Urine: NEGATIVE
GLUCOSE, UA: NEGATIVE mg/dL
KETONES UR: 15 mg/dL — AB
Nitrite: NEGATIVE
PROTEIN: NEGATIVE mg/dL
Specific Gravity, Urine: 1.02 (ref 1.005–1.030)
pH: 6.5 (ref 5.0–8.0)

## 2017-09-26 LAB — URINALYSIS, MICROSCOPIC (REFLEX)

## 2017-09-26 MED ORDER — ONDANSETRON 4 MG PO TBDP: 4.0000 mg | ORAL_TABLET | Freq: Three times a day (TID) | ORAL | 0 refills | Status: DC | PRN

## 2017-09-26 MED ORDER — OXYCODONE-ACETAMINOPHEN 5-325 MG PO TABS: 1.0000 | ORAL_TABLET | Freq: Four times a day (QID) | ORAL | 0 refills | Status: DC | PRN

## 2017-09-26 MED ORDER — POTASSIUM CHLORIDE CRYS ER 20 MEQ PO TBCR
20.0000 meq | EXTENDED_RELEASE_TABLET | Freq: Once | ORAL | Status: AC
  Administered 2017-09-26: 20 meq via ORAL
  Filled 2017-09-26: qty 1

## 2017-09-26 MED ORDER — AZITHROMYCIN 250 MG PO TABS: 250.0000 mg | ORAL_TABLET | Freq: Every day | ORAL | 0 refills | Status: DC

## 2017-09-26 MED ORDER — ONDANSETRON 4 MG PO TBDP
4.0000 mg | ORAL_TABLET | Freq: Once | ORAL | Status: AC
  Administered 2017-09-26: 4 mg via ORAL
  Filled 2017-09-26: qty 1

## 2017-09-26 MED ORDER — SODIUM CHLORIDE 0.9 % IV BOLUS
500.0000 mL | Freq: Once | INTRAVENOUS | Status: AC
  Administered 2017-09-26: 500 mL via INTRAVENOUS

## 2017-09-26 MED ORDER — ONDANSETRON HCL 4 MG/2ML IJ SOLN
4.0000 mg | Freq: Once | INTRAMUSCULAR | Status: AC
  Administered 2017-09-26: 4 mg via INTRAVENOUS
  Filled 2017-09-26: qty 2

## 2017-09-26 MED ORDER — IOPAMIDOL (ISOVUE-300) INJECTION 61%
100.0000 mL | Freq: Once | INTRAVENOUS | Status: AC | PRN
  Administered 2017-09-26: 100 mL via INTRAVENOUS

## 2017-09-26 MED ORDER — OXYCODONE-ACETAMINOPHEN 5-325 MG PO TABS
1.0000 | ORAL_TABLET | Freq: Once | ORAL | Status: AC
  Administered 2017-09-26: 1 via ORAL
  Filled 2017-09-26: qty 1

## 2017-09-26 MED ORDER — AMOXICILLIN-POT CLAVULANATE 875-125 MG PO TABS: 1.0000 | ORAL_TABLET | Freq: Two times a day (BID) | ORAL | 0 refills | Status: DC

## 2017-09-26 MED ORDER — IOPAMIDOL (ISOVUE-300) INJECTION 61%
30.0000 mL | Freq: Once | INTRAVENOUS | Status: AC | PRN
  Administered 2017-09-26: 30 mL via ORAL

## 2017-09-26 MED ORDER — MORPHINE SULFATE (PF) 4 MG/ML IV SOLN
4.0000 mg | Freq: Once | INTRAVENOUS | Status: AC
  Administered 2017-09-26: 4 mg via INTRAVENOUS
  Filled 2017-09-26: qty 1

## 2017-09-26 NOTE — ED Triage Notes (Signed)
Pt c/o constipation  X 5 days, no results with OTC meds

## 2017-09-26 NOTE — ED Provider Notes (Signed)
[facility] EMERGENCY DEPARTMENT Provider Note   CSN: [id number] Arrival date & time: 09/26/17  1441     History   Chief Complaint Chief Complaint  Patient presents with  . Constipation    HPI Patricia Sutton is a 72 y.o. female with a history of chronic back pain, COPD, GERD who presents emergency department today for constipation and abdominal pain.  Patient states that 5 days ago she started having lower abdominal pain that was worse on the left.  She notes that she thought this may be due to constipation as she has had very hard infrequent stools of the last 2 weeks.  She notes that she took a bottle of mag citrate on Saturday, 09/22/2017 and was able to "clean herself out".  She notes that she had several more bowel movements during the day but since has not been able to have one.  She notes that even after having several bowel movements she has had continued pain of her lower abdomen.  She notes some associated nausea without any emesis.  She denies any preceding diarrhea.  No melena or hematochezia.  She has not been taking anything for her symptoms.  She denies any fever, chills, chest pain, shortness of breath, urinary frequency, urinary urgency, dysuria, hematuria.  She notes that she had a colonoscopy approximately 1 year ago that showed polyps that were removed but she is unsure if this showed diverticuli.  She denies any previous abdominal surgeries.  She is still passing gas.  HPI  Past Medical History:  Diagnosis Date  . Anxiety   . Arthritis   . Asthma   . Back pain, chronic   . Bone spur of other site    spine  . Chronic back pain    spurs on spine  . COPD (chronic obstructive pulmonary disease) (HCC)    denies SOB  . Depression    takes Effexor daily  . GERD (gastroesophageal reflux disease)    takes Omeprazole daily  . History of bronchitis    last time about 6+yrs ago  . History of colon polyps   . History of kidney stones   . Hypothyroidism    takes Synthroid daily  . Insomnia    takes Trazodone nightly and Ambien  . Joint pain   . Joint swelling   . Nocturia   . Pneumonia    hx of;last time about 6+yrs ago  . PONV (postoperative nausea and vomiting)   . Renal insufficiency   . Sleep apnea    no CPAP; uses O2 at night 1l/min.  Marland Kitchen Urinary urgency     Patient Active Problem List   Diagnosis Date Noted  . Tobacco use disorder 03/15/2015  . UTI (lower urinary tract infection) 03/15/2015  . COPD (chronic obstructive pulmonary disease) (HCC) 03/15/2015  . Hypothyroidism 03/15/2015  . Chronic pain syndrome 03/15/2015  . Dehydration 03/15/2015  . Elevated troponin 03/14/2015  . Osteoarthritis of right knee 07/18/2013    Class: Chronic  . Loose body of right knee 07/16/2012    Class: Chronic    Past Surgical History:  Procedure Laterality Date  . ABDOMINAL HYSTERECTOMY     complete  . bladder tacked    . COLONOSCOPY    . ESOPHAGOGASTRODUODENOSCOPY    . KNEE ARTHROPLASTY Right 07/18/2013   Procedure: COMPUTER ASSISTED RIGHT TOTAL KNEE ARTHROPLASTY;  Surgeon: Kerrin Champagne, MD;  Location: [facility] OR;  Service: Orthopedics;  Laterality: Right;  . KNEE ARTHROSCOPY Right 07/16/2012  Procedure: ARTHROSCOPY KNEE;  Surgeon: Kerrin Champagne, MD;  Location: [facility];  Service: Orthopedics;  Laterality: Right;  Right knee arthroscopy with chondroplastic shaving of retropatella surface     OB History   None      Home Medications    Prior to Admission medications   Medication Sig Start Date End Date Taking? Authorizing Provider  albuterol (PROVENTIL) (2.5 MG/3ML) 0.083% nebulizer solution Take 2.5 mg by nebulization every 4 (four) hours as needed for wheezing or shortness of breath.    [provider]  aspirin EC 81 MG tablet Take 1 tablet (81 mg total) by mouth daily. 03/16/15   Catarina Hartshorn, MD  budesonide-formoterol (SYMBICORT) 160-4.5 MCG/ACT inhaler Inhale 2 puffs into the lungs 2 (two) times daily.     [provider]  docusate sodium (COLACE) 100 MG capsule Take 1 capsule (100 mg total) by mouth 2 (two) times daily. 03/16/15   Catarina Hartshorn, MD  levothyroxine (SYNTHROID, LEVOTHROID) 25 MCG tablet Take 25 mcg by mouth daily before breakfast.    [provider]  meloxicam (MOBIC) 15 MG tablet TAKE 1 TABLET BY MOUTH EVERY MORNING WITH FOOD 06/22/17   Kerrin Champagne, MD  methocarbamol (ROBAXIN) 500 MG tablet Take 1 tablet (500 mg total) by mouth every 8 (eight) hours as needed for muscle spasms. 12/16/16   Molpus, John, MD  nabumetone (RELAFEN) 500 MG tablet TAKE 1 TABLET(500 MG) BY MOUTH TWICE DAILY 08/28/17   Kerrin Champagne, MD  omeprazole (PRILOSEC) 20 MG capsule Take 20 mg by mouth 2 (two) times daily.    [provider]  oxyCODONE-acetaminophen (PERCOCET/ROXICET) 5-325 MG tablet Take 1 tablet by mouth every 6 (six) hours as needed for severe pain. 04/17/17   Kerrin Champagne, MD  Pregabalin (LYRICA PO) Take by mouth.    [provider]  promethazine (PHENERGAN) 25 MG tablet Take 25 mg by mouth 3 (three) times daily as needed for nausea or vomiting.    [provider]  traMADol (ULTRAM) 50 MG tablet Take 1 tablet (50 mg total) by mouth every 6 (six) hours as needed. 08/10/17   Kerrin Champagne, MD  traZODone (DESYREL) 100 MG tablet Take 100 mg by mouth at bedtime.     [provider]  VOLTAREN 1 % GEL APPLY 2 TO 4 GRAMS TO THE AFFECTED AREA 2 TO 3 TIMES DAILY 04/26/17   Kerrin Champagne, MD    Family History Family History  Problem Relation Age of Onset  . Heart attack Mother   . Stroke Mother   . Diabetes Mellitus II Mother   . Lymphoma Father     Social History Social History   Tobacco Use  . Smoking status: Current Some Day Smoker    Packs/day: 1.00    Years: 40.00    Pack years: 40.00    Types: Cigarettes  . Smokeless tobacco: Never Used  Substance Use Topics  . Alcohol use: No  . Drug use: No     Allergies   Patient has no known  allergies.   Review of Systems Review of Systems  All other systems reviewed and are negative.    Physical Exam Updated Vital Signs BP 135/62   Pulse 78   Temp 98.3 F (36.8 C)   Resp 16   Ht 5\' 3"  (1.6 m)   Wt 51.3 kg (113 lb)   SpO2 92%   BMI 20.02 kg/m   Physical Exam  Constitutional: She appears  well-developed and well-nourished.  Elderly female in no acute distress.  HENT:  Head: Normocephalic and atraumatic.  Right Ear: External ear normal.  Left Ear: External ear normal.  Nose: Nose normal.  Mouth/Throat: Uvula is midline, oropharynx is clear and moist and mucous membranes are normal. No tonsillar exudate.  Eyes: Pupils are equal, round, and reactive to light. Right eye exhibits no discharge. Left eye exhibits no discharge. No scleral icterus.  Neck: Trachea normal. Neck supple. No spinous process tenderness present. No neck rigidity. Normal range of motion present.  Cardiovascular: Normal rate, regular rhythm and intact distal pulses.  No murmur heard. Pulses:      Radial pulses are 2+ on the right side, and 2+ on the left side.       Dorsalis pedis pulses are 2+ on the right side, and 2+ on the left side.       Posterior tibial pulses are 2+ on the right side, and 2+ on the left side.  No lower extremity swelling or edema. Calves symmetric in size bilaterally.  Pulmonary/Chest: Effort normal and breath sounds normal. She exhibits no tenderness.  Abdominal: Soft. Bowel sounds are normal. There is generalized tenderness and tenderness in the suprapubic area and left lower quadrant. There is no rigidity, no rebound, no guarding and no CVA tenderness.  Genitourinary:  Genitourinary Comments: Chaperone was present.  Patient without pain around the rectal area. Perianal sensory intact. No external fissure's palpated or examined. Patient with external hemorrhoids noted without evidence of thrombosis. No induration of the skin or swelling. Digital Rectal Exam reveals  sphincter with good tone. No masses palpated. Stool color is brown with no overt blood or melena.  Musculoskeletal: She exhibits no edema.  Lymphadenopathy:    She has no cervical adenopathy.  Neurological: She is alert.  Skin: Skin is warm and dry. No rash noted. She is not diaphoretic.  Psychiatric: She has a normal mood and affect.  Nursing note and vitals reviewed.    ED Treatments / Results  Labs (all labs ordered are listed, but only abnormal results are displayed) Labs Reviewed  URINALYSIS, ROUTINE W REFLEX MICROSCOPIC - Abnormal; Notable for the following components:      Result Value   Hgb urine dipstick SMALL (*)    Ketones, ur 15 (*)    Leukocytes, UA TRACE (*)    All other components within normal limits  URINALYSIS, MICROSCOPIC (REFLEX) - Abnormal; Notable for the following components:   Bacteria, UA FEW (*)    All other components within normal limits  COMPREHENSIVE METABOLIC PANEL - Abnormal; Notable for the following components:   Potassium 3.3 (*)    Calcium 8.5 (*)    Albumin 3.3 (*)    All other components within normal limits  CBC WITH DIFFERENTIAL/PLATELET - Abnormal; Notable for the following components:   WBC 14.4 (*)    Neutro Abs 9.4 (*)    Monocytes Absolute 1.4 (*)    All other components within normal limits  URINE CULTURE  LIPASE, BLOOD    EKG None  Radiology Dg Abdomen 1 View  Result Date: 09/26/2017 CLINICAL DATA:  Abdominal pain and constipation for 5 days, history of 2 prior bowel obstructions, history asthma, COPD, GERD, smoker EXAM: ABDOMEN - 1 VIEW COMPARISON:  CT abdomen and pelvis 01/17/2016 FINDINGS: Nonobstructive bowel gas pattern. Scattered gas and stool throughout colon. No bowel dilatation or bowel wall thickening. Small bowel gas pattern normal. Bones demineralized. Lung bases clear. No definite urinary tract calcifications.  IMPRESSION: Nonspecific bowel gas pattern. Electronically Signed   By: Ulyses Southward M.D.   On: 09/26/2017  15:24   Ct Abdomen Pelvis W Contrast  Result Date: 09/26/2017 CLINICAL DATA:  Abdominal pain, constipation EXAM: CT ABDOMEN AND PELVIS WITH CONTRAST TECHNIQUE: Multidetector CT imaging of the abdomen and pelvis was performed using the standard protocol following bolus administration of intravenous contrast. CONTRAST:  ISOVUE-300 IOPAMIDOL (ISOVUE-300) INJECTION 61% COMPARISON:  Partial comparison is CT chest dated 06/07/2017. CT abdomen/pelvis dated 01/17/2016. FINDINGS: Lower chest: Patchy opacity in the medial left lower lobe (series 4/image 10), reflecting left lower lobe pneumonia. Hepatobiliary: Liver is within normal limits. Gallbladder is unremarkable. No intrahepatic or extrahepatic duct dilatation. Pancreas: Stable prominence of the main pancreatic duct. No pancreatic mass or atrophy. Spleen: Within normal limits. Adrenals/Urinary Tract: Mild thickening of the right adrenal gland, chronic. Left adrenal glands within normal limits. Kidneys are within normal limits.  No hydronephrosis. Low-lying bladder/cystocele at rest. Stomach/Bowel: Stomach is within normal limits. No evidence of bowel obstruction. Normal appendix (series 2/image 55). Vascular/Lymphatic: No evidence of abdominal aortic aneurysm. Atherosclerotic calcifications of the abdominal aorta and branch vessels. No suspicious abdominopelvic lymphadenopathy. Reproductive: Status post hysterectomy. No adnexal masses. Other: No abdominopelvic ascites. Musculoskeletal: Degenerative changes of the visualized thoracolumbar spine. IMPRESSION: Left lower lobe pneumonia. Otherwise, no CT findings to account for the patient's abdominal pain. Additional ancillary findings as above. Electronically Signed   By: Charline Bills M.D.   On: 09/26/2017 19:01    Procedures Procedures (including critical care time)  Medications Ordered in ED Medications  sodium chloride 0.9 % bolus 500 mL (0 mLs Intravenous Stopped 09/26/17 1705)  morphine 4 MG/ML  injection 4 mg (4 mg Intravenous Given 09/26/17 1639)  ondansetron (ZOFRAN) injection 4 mg (4 mg Intravenous Given 09/26/17 1639)  morphine 4 MG/ML injection 4 mg (4 mg Intravenous Given 09/26/17 1815)  iopamidol (ISOVUE-300) 61 % injection 30 mL (30 mLs Oral Contrast Given 09/26/17 1644)  iopamidol (ISOVUE-300) 61 % injection 100 mL (100 mLs Intravenous Contrast Given 09/26/17 1845)     Initial Impression / Assessment and Plan / ED Course  I have reviewed the triage vital signs and the nursing notes.  Pertinent labs & imaging results that were available during my care of the patient were reviewed by me and considered in my medical decision making (see chart for details).     72 year old female presenting to the emergency department today for abdominal pain.  Patient states that she has had 5 days of lower abdominal pain that was worse on the left.  She thought this may be due to constipation and took mag citrate with significant relief on Saturday.  She notes she has not had any bowel movement since this time but has been passing gas.  She reports that she has constant lower abdominal pain with some associated nausea but no emesis.  She denies any fever, diarrhea or urinary symptoms.  Patient did have a colonoscopy approximately 1 year ago that showed polyps but she is unsure if this showed diverticuli.  Her vital signs are reassuring on presentation.  No fever, tachycardia, tachypnea, hypoxia or hypotension.  She is non-ill and nonseptic appearing.  Patient does have some lower abdominal tenderness that is worse on the left on exam.  No peritoneal signs.  Rectal exam reveals no stool impaction.  Prior to my evaluation patient did receive a urinalysis as well as an abdominal x-ray.  Patient's urinalysis is not a clean catch.  It has 11-20 squamous epithelial cells.  It does have few bacteria with 6- 10 white blood cells and trace leukocytes but given this is a dirty catch. Will send urine culture and await for  treatment as patient is without urinary symptoms at this time.  Patient's x-ray unremarkable.  Does not show evidence of constipation or obstruction.  Given patient's pain on palpation feel patient will need blood work and CT scan to evaluate.  Patient does have a leukocytosis of 14.4 with a shift of absolute neutrophils of 9.4.  No anemia.  Lipase is within normal limits.  CMP is remarkable for hypokalemia of 3.3.  Will hold off on replacing this orally given and evaluate for a intra-abdominal process and patient is n.p.o.  Patient without any other significant electrolyte derangements.  No acute kidney injury.  LFTs within normal limits.  No anion gap acidosis.  Patient CT scan without evidence of intra-abdominal process.  No evidence of bowel obstruction, appendicitis or diverticulitis.  She does state that patient has left lower lobe pneumonia.  On requestioning of the patient she states she has a chronic cough that has been unchanged.  She denies any chest pain, fevers or shortness of breath.  Patient has remained without hypoxia in the department.  However given the patient has a leukocytosis will treat with oral antibiotics (dual coverage with Augmentin and azithromycin given her underlying COPD - patient has no allergies).   Repeat abdominal exam the patient is without any lower abdominal tenderness palpation or peritoneal signs.  Curiously the patient has developed upper abdominal pain in the epigastric area.  She is without right upper quadrant abdominal tenderness palpation or Murphy sign however given her ongoing pain will order right upper quadrant ultrasound to evaluate.  Gallbladder appeared normal on CT scan.  With [place] pending case signed out to Kathrine Haddock with his physician as she feels appropriate.  If ultrasound is negative weekly discharge home with oral antibiotics and close follow-up with PCP.  Gallbladder shows pathology, work-up as indicated.  Patient's pain currently controlled at  time of handoff.  She appears hemodynamically stable for handoff care.  Patient case seen and discussed with Dr. Criss Alvine who is in agreement with plan.   Final Clinical Impressions(s) / ED Diagnoses   Final diagnoses:  None    ED Discharge Orders    None       Princella Pellegrini 09/26/17 2036    Pricilla Loveless, MD 09/26/17 [phone]

## 2017-09-26 NOTE — ED Notes (Signed)
UA collected , not enough for culture

## 2017-09-26 NOTE — Discharge Instructions (Addendum)
You were seen in the emergency department today for abdominal pain.  Your CT showed findings consistent with pneumonia in your left lower lung.  We are treating this with Augmentin and azithromycin.  These are antibiotics. Please take all of your antibiotics until finished. You may develop abdominal discomfort or diarrhea from the antibiotic.  You may help offset this with probiotics which you can buy at the store (ask your pharmacist if unable to find) or get probiotics in the form of eating yogurt. Do not eat or take the probiotics until 2 hours after your antibiotic. If you are unable to tolerate these side effects follow-up with your primary care provider or return to the emergency department.   If you begin to experience any blistering, rashes, swelling, or difficulty breathing seek medical care for evaluation of potentially more serious side effects.   Please be aware that this medication may interact with other medications you are taking, please be sure to discuss your medication list with your pharmacist.    The ultrasound of your gallbladder showed some abnormalities.  For this reason we would like you to follow-up with general surgery.  We have given you Dr. Ermalene Searing office phone number your discharge instructions.  Please call their office tomorrow morning at [phone] in order to schedule an appointment for sometime tomorrow.  We have given you a prescription for Percocet for pain in the meantime.  Take the Percocet every 6 hours as needed for pain.  Do not drive or operate heavy machinery when you take this medicine.  Keep this out of reach of small children.  Return to the emergency department at anytime for any new or worsening symptoms including but not limited to worsening pain, inability to keep fluids down, vomiting, blood in your stool, blood in your vomit, trouble breathing, fever, or any other concerns that you may have.

## 2017-09-26 NOTE — ED Notes (Signed)
Report given to Amanda, RN

## 2017-09-26 NOTE — ED Notes (Signed)
Pt very upset that she has been here for 6 hours and nothing has been decided.  She is requesting PO fluids.  Spoke to EDP and she relates that she needs to speak to the surgeon before clearing pt.  Gave some mouth swabs to patient.

## 2017-09-26 NOTE — ED Provider Notes (Signed)
20:09: Patient signed out to me at change of shift from [facility] pending RUQ US of the abdomen.   Please see previous provider note for full H&P.   In summary patient is a 72 year old female with a hx of  COPD, nephrolithiasis, GERD, hypothyroidism, and renal insufficiency who presented with abdominal pain x 5 days. She had some associated constipation which improved with mag citrate over the weekend. She has had nausea without vomiting. Denies fever, chills, dysuria, melena, or hematochezia.    Physical Exam  BP (!) 132/58   Pulse 63   Temp 98.3 F (36.8 C)   Resp 18   Ht  (1.6 m)   Wt 51.3 kg (113 lb)   SpO2 94%   BMI 20.02 kg/m   Physical Exam  Constitutional: She appears well-developed and well-nourished. No distress.  HENT:  Head: Normocephalic and atraumatic.  Eyes: Conjunctivae are normal. Right eye exhibits no discharge. Left eye exhibits no discharge.  Abdominal: There is tenderness (epigastric and lower abdomen). There is negative Murphy's sign.  Neurological: She is alert.  Clear speech.   Psychiatric: She has a normal mood and affect. Her behavior is normal. Thought content normal.  Nursing note and vitals reviewed.   ED Course/Procedures     Procedures  Results for orders placed or performed during the hospital encounter of 09/26/17  Urinalysis, Routine w reflex microscopic  Result Value Ref Range   Color, Urine YELLOW YELLOW   APPearance CLEAR CLEAR   Specific Gravity, Urine 1.020 1.005 - 1.030   pH 6.5 5.0 - 8.0   Glucose, UA NEGATIVE NEGATIVE mg/dL   Hgb urine dipstick SMALL (A) NEGATIVE   Bilirubin Urine NEGATIVE NEGATIVE   Ketones, ur 15 (A) NEGATIVE mg/dL   Protein, ur NEGATIVE NEGATIVE mg/dL   Nitrite NEGATIVE NEGATIVE   Leukocytes, UA TRACE (A) NEGATIVE  Urinalysis, Microscopic (reflex)  Result Value Ref Range   RBC / HPF 6-10 0 - 5 RBC/hpf   WBC, UA 6-10 0 - 5 WBC/hpf   Bacteria, UA FEW (A) NONE SEEN   Squamous Epithelial / LPF  11-20 0 - 5   Mucus PRESENT   Comprehensive metabolic panel  Result Value Ref Range   Sodium 141 135 - 145 mmol/L   Potassium 3.3 (L) 3.5 - 5.1 mmol/L   Chloride 106 101 - 111 mmol/L   CO2 27 22 - 32 mmol/L   Glucose, Bld 79 65 - 99 mg/dL   BUN 12 6 - 20 mg/dL   Creatinine, Ser 1.61 0.44 - 1.00 mg/dL   Calcium 8.5 (L) 8.9 - 10.3 mg/dL   Total Protein 6.7 6.5 - 8.1 g/dL   Albumin 3.3 (L) 3.5 - 5.0 g/dL   AST 18 15 - 41 U/L   ALT 16 14 - 54 U/L   Alkaline Phosphatase 83 38 - 126 U/L   Total Bilirubin 0.5 0.3 - 1.2 mg/dL   GFR calc non Af Amer >60 >60 mL/min   GFR calc Af Amer >60 >60 mL/min   Anion gap 8 5 - 15  CBC with Differential  Result Value Ref Range   WBC 14.4 (H) 4.0 - 10.5 K/uL   RBC 4.14 3.87 - 5.11 MIL/uL   Hemoglobin 13.1 12.0 - 15.0 g/dL   HCT 09.6 04.5 - 40.9 %   MCV 93.5 78.0 - 100.0 fL   MCH 31.6 26.0 - 34.0 pg   MCHC 33.9 30.0 - 36.0 g/dL   RDW 81.1 91.4 -  15.5 %   Platelets 261 150 - 400 K/uL   Neutrophils Relative % 64 %   Neutro Abs 9.4 (H) 1.7 - 7.7 K/uL   Lymphocytes Relative 25 %   Lymphs Abs 3.6 0.7 - 4.0 K/uL   Monocytes Relative 10 %   Monocytes Absolute 1.4 (H) 0.1 - 1.0 K/uL   Eosinophils Relative 1 %   Eosinophils Absolute 0.1 0.0 - 0.7 K/uL   Basophils Relative 0 %   Basophils Absolute 0.0 0.0 - 0.1 K/uL  Lipase, blood  Result Value Ref Range   Lipase 25 11 - 51 U/L   Dg Abdomen 1 View  Result Date: 09/26/2017 CLINICAL DATA:  Abdominal pain and constipation for 5 days, history of 2 prior bowel obstructions, history asthma, COPD, GERD, smoker EXAM: ABDOMEN - 1 VIEW COMPARISON:  CT abdomen and pelvis 01/17/2016 FINDINGS: Nonobstructive bowel gas pattern. Scattered gas and stool throughout colon. No bowel dilatation or bowel wall thickening. Small bowel gas pattern normal. Bones demineralized. Lung bases clear. No definite urinary tract calcifications. IMPRESSION: Nonspecific bowel gas pattern. Electronically Signed   By: Ulyses Southward M.D.   On:  09/26/2017 15:24   Ct Abdomen Pelvis W Contrast  Result Date: 09/26/2017 CLINICAL DATA:  Abdominal pain, constipation EXAM: CT ABDOMEN AND PELVIS WITH CONTRAST TECHNIQUE: Multidetector CT imaging of the abdomen and pelvis was performed using the standard protocol following bolus administration of intravenous contrast. CONTRAST:  ISOVUE-300 IOPAMIDOL (ISOVUE-300) INJECTION 61% COMPARISON:  Partial comparison is CT chest dated 06/07/2017. CT abdomen/pelvis dated 01/17/2016. FINDINGS: Lower chest: Patchy opacity in the medial left lower lobe (series 4/image 10), reflecting left lower lobe pneumonia. Hepatobiliary: Liver is within normal limits. Gallbladder is unremarkable. No intrahepatic or extrahepatic duct dilatation. Pancreas: Stable prominence of the main pancreatic duct. No pancreatic mass or atrophy. Spleen: Within normal limits. Adrenals/Urinary Tract: Mild thickening of the right adrenal gland, chronic. Left adrenal glands within normal limits. Kidneys are within normal limits.  No hydronephrosis. Low-lying bladder/cystocele at rest. Stomach/Bowel: Stomach is within normal limits. No evidence of bowel obstruction. Normal appendix (series 2/image 55). Vascular/Lymphatic: No evidence of abdominal aortic aneurysm. Atherosclerotic calcifications of the abdominal aorta and branch vessels. No suspicious abdominopelvic lymphadenopathy. Reproductive: Status post hysterectomy. No adnexal masses. Other: No abdominopelvic ascites. Musculoskeletal: Degenerative changes of the visualized thoracolumbar spine. IMPRESSION: Left lower lobe pneumonia. Otherwise, no CT findings to account for the patient's abdominal pain. Additional ancillary findings as above. Electronically Signed   By: Charline Bills M.D.   On: 09/26/2017 19:01   US Abdomen Limited Ruq  Result Date: 09/26/2017 CLINICAL DATA:  Upper abdominal pain for 1 week. EXAM: ULTRASOUND ABDOMEN LIMITED RIGHT UPPER QUADRANT COMPARISON:  CT 09/26/2017  FINDINGS: Gallbladder: The gallbladder wall is not thickened measuring 0.14 cm in single wall thickness. Minimal biliary sludge and possible tiny calculi within the sludge is suggested. No sonographic Murphy sign noted by sonographer. Trace pericholecystic fluid. Common bile duct: Diameter: 3 mm Liver: No focal lesion identified. Within normal limits in parenchymal echogenicity. Portal vein is patent on color Doppler imaging with normal direction of blood flow towards the liver. IMPRESSION: No conclusive evidence for acute cholecystitis. Trace pericholecystic fluid without wall thickening nor sonographic Murphy's. Equivocal calculus within minimal biliary sludge noted in the gallbladder. Unremarkable liver and common duct. Electronically Signed   By: Tollie Eth M.D.   On: 09/26/2017 20:31    MDM   Patient presented with abdominal pain.  Labs notable for leukocytosis at 14.4.  Mild hypokalemia 3.3 will orally replaced in the ER if able after imaging.  No significant abnormality in AST/ALT or renal function.  CT abdomen/pelvis without findings to explain patient's abdominal pain, incidental finding of left lower lobe pneumonia-patient has had her baseline cough, no dyspnea, chest pain, or fevers, no signs of respiratory distress- discussed plan for tx with azithromycin and augmentin if plan for DC home with prior PA.   Signed out to me pending ultrasound.  Ultrasound findings with No conclusive evidence for acute cholecystitis. Trace pericholecystic fluid without wall thickening nor sonographic Murphy's. Equivocal calculus within minimal biliary sludge noted in the gallbladder. Unremarkable liver and common duct.   21:15: RE-EVAL: Patient states she is not in pain at this time. On exam she is tender in the epigastric region and lower abdomen. No peritoneal signs.   Discussed with supervising physician Dr. Criss Alvine who has personally evaluated this patient, given she remains tender with [place] reading will plan  to consult general surgery.   21:50: CONSULT: Discussed case with general surgeon Dr. Daphine Deutscher- recommends pain control with follow up in office tomorrow. Asked to have patient call the office at 09:15 to schedule a same day appointment.   We will treat patient for community-acquired pneumonia with Augmentin and Azithromycin.  Will provide percocet for pain and zofran for nausea. She is tolerating PO in the ED. We will have her follow-up with general surgery tomorrow morning. I discussed results, treatment plan, need for general surgery follow-up, and return precautions with the patient. Provided opportunity for questions, patient confirmed understanding and is in agreement with plan. Dr. Criss Alvine is in agreement as well.      Cherly Anderson, PA-C 09/27/17 Lazarus Gowda    Pricilla Loveless, MD 09/27/17 1501

## 2017-09-26 NOTE — ED Notes (Signed)
Patient transported to X-ray 

## 2017-09-28 DIAGNOSIS — J189 Pneumonia, unspecified organism: Secondary | ICD-10-CM | POA: Insufficient documentation

## 2017-09-28 LAB — URINE CULTURE

## 2017-12-17 ENCOUNTER — Telehealth (INDEPENDENT_AMBULATORY_CARE_PROVIDER_SITE_OTHER): Payer: Self-pay | Admitting: Physical Medicine and Rehabilitation

## 2017-12-17 NOTE — Telephone Encounter (Signed)
Patient will schedule an appointment with Dr.Nitka.

## 2017-12-17 NOTE — Telephone Encounter (Signed)
They will let us do RFA at the earliest every 6 months, she should follow up with Dr. Otelia SergeantNitka

## 2017-12-20 ENCOUNTER — Ambulatory Visit (INDEPENDENT_AMBULATORY_CARE_PROVIDER_SITE_OTHER): Payer: Medicare Other | Admitting: Surgery

## 2017-12-20 ENCOUNTER — Encounter (INDEPENDENT_AMBULATORY_CARE_PROVIDER_SITE_OTHER): Payer: Self-pay | Admitting: Surgery

## 2017-12-20 ENCOUNTER — Ambulatory Visit (INDEPENDENT_AMBULATORY_CARE_PROVIDER_SITE_OTHER): Payer: Self-pay

## 2017-12-20 VITALS — BP 150/73 | HR 75

## 2017-12-20 DIAGNOSIS — M79604 Pain in right leg: Secondary | ICD-10-CM | POA: Diagnosis not present

## 2017-12-20 DIAGNOSIS — M79605 Pain in left leg: Secondary | ICD-10-CM

## 2017-12-20 DIAGNOSIS — M25561 Pain in right knee: Secondary | ICD-10-CM

## 2017-12-20 DIAGNOSIS — M7051 Other bursitis of knee, right knee: Secondary | ICD-10-CM | POA: Diagnosis not present

## 2017-12-20 DIAGNOSIS — M47816 Spondylosis without myelopathy or radiculopathy, lumbar region: Secondary | ICD-10-CM | POA: Diagnosis not present

## 2017-12-20 DIAGNOSIS — G8929 Other chronic pain: Secondary | ICD-10-CM

## 2017-12-20 MED ORDER — BUPIVACAINE HCL 0.25 % IJ SOLN
0.6600 mL | INTRAMUSCULAR | Status: AC | PRN
  Administered 2017-12-20: .66 mL via INTRA_ARTICULAR

## 2017-12-20 MED ORDER — METHYLPREDNISOLONE ACETATE 40 MG/ML IJ SUSP
13.3300 mg | INTRAMUSCULAR | Status: AC | PRN
  Administered 2017-12-20: 13.33 mg via INTRA_ARTICULAR

## 2017-12-20 MED ORDER — TRAMADOL HCL 50 MG PO TABS: 50.0000 mg | ORAL_TABLET | Freq: Two times a day (BID) | ORAL | 0 refills | Status: DC | PRN

## 2017-12-20 NOTE — Progress Notes (Addendum)
Office Visit Note   Patient: Patricia Sutton           Date of Birth: Jan 19, 1946           MRN: [id number] Visit Date: 12/20/2017              Requested by: No referring provider defined for this encounter. PCP: Patient, No Pcp Per   Assessment & Plan: Visit Diagnoses:  1. Bilateral leg pain   2. Chronic pain of right knee   3. Spondylosis of lumbar spine   4. Pes anserinus bursitis of right knee     Plan: Since patient main complaint today is with right proximal medial tibia pain offered injection for Pes bursitis.  After sitting for a couple of minutes patient reported complete relief of her pes bursa pain with Marcaine in place.  Patient can use ice on this area later today if needed.  Refill prescription for Ultram.  Follow-up with Dr. Otelia SergeantNitka in 6 weeks for recheck.  States that she is doing well after recent injections with Dr. Alvester MorinNewton.  Follow-Up Instructions: No follow-ups on file.   Orders:  Orders Placed This Encounter  Procedures  . XR Lumbar Spine Complete W/Bend  . XR Knee 1-2 Views Right   No orders of the defined types were placed in this encounter.     Procedures: Large Joint Inj: R knee (Pes bursa) on 12/20/2017 3:46 PM Indications: pain Details: 25 G needle, medial approach Medications: 13.33 mg methylPREDNISolone acetate 40 MG/ML; 0.66 mL bupivacaine 0.25 % Consent was given by the patient.       Clinical Data: No additional findings.   Subjective: Chief Complaint  Patient presents with  . Lower Back - Pain  . Right Knee - Pain    HPI [id number] year old white female comes in today with complaints of intermittent Mody radicular pain and right medial knee pain.  Patient had radiofrequency ablation by Dr. Alvester MorinNewton August 21, 2017.  States that leg pain is done well after that was performed.  Shortly after the procedure she suffered a fall which aggravated some of her leg pain but mostly she is been complaining of right proximal medial tibial pain.  She is  status post right total knee replacement by Dr. Otelia SergeantNitka February 2015.  Knee was doing well before her fall.  Localizes pain at the pes bursa  And aggravated with ambulation.  Review of systems no current cardiac pulmonary GI GU issues  Objective: Vital Signs: BP (!) 150/73   Pulse 75   Physical Exam  Constitutional: She is oriented to person, place, and time. No distress.  HENT:  Head: Normocephalic and atraumatic.  Eyes: Pupils are equal, round, and reactive to light. EOM are normal.  Pulmonary/Chest: No respiratory distress.  Musculoskeletal:  Gait is antalgic due to pain around the medial proximal tibia.  In this area she is exquisitely tender over the pes bursa.  Slight swelling of the bursa.  No bruising.  Joint line nontender.  Negative logroll bilateral hips.  Negative straight leg raise.  No lumbar paraspinal tenderness.  Bilateral sciatic notch nontender.  Nontender over the bilateral hip greater trochanter bursa.  Neurological: She is alert and oriented to person, place, and time.  Skin: Skin is warm and dry.  Psychiatric: She has a normal mood and affect.    Ortho Exam  Specialty Comments:  No specialty comments available.  Imaging: No results found.   PMFS History: Patient Active Problem List   Diagnosis Date  Noted  . Tobacco use disorder 03/15/2015  . UTI (lower urinary tract infection) 03/15/2015  . COPD (chronic obstructive pulmonary disease) (HCC) 03/15/2015  . Hypothyroidism 03/15/2015  . Chronic pain syndrome 03/15/2015  . Dehydration 03/15/2015  . Elevated troponin 03/14/2015  . Osteoarthritis of right knee 07/18/2013    Class: Chronic  . Loose body of right knee 07/16/2012    Class: Chronic   Past Medical History:  Diagnosis Date  . Anxiety   . Arthritis   . Asthma   . Back pain, chronic   . Bone spur of other site    spine  . Chronic back pain    spurs on spine  . COPD (chronic obstructive pulmonary disease) (HCC)    denies SOB  .  Depression    takes Effexor daily  . GERD (gastroesophageal reflux disease)    takes Omeprazole daily  . History of bronchitis    last time about 6+yrs ago  . History of colon polyps   . History of kidney stones   . Hypothyroidism    takes Synthroid daily  . Insomnia    takes Trazodone nightly and Ambien  . Joint pain   . Joint swelling   . Nocturia   . Pneumonia    hx of;last time about 6+yrs ago  . PONV (postoperative nausea and vomiting)   . Renal insufficiency   . Sleep apnea    no CPAP; uses O2 at night 1l/min.  Marland Kitchen Urinary urgency     Family History  Problem Relation Age of Onset  . Heart attack Mother   . Stroke Mother   . Diabetes Mellitus II Mother   . Lymphoma Father     Past Surgical History:  Procedure Laterality Date  . ABDOMINAL HYSTERECTOMY     complete  . bladder tacked    . COLONOSCOPY    . ESOPHAGOGASTRODUODENOSCOPY    . KNEE ARTHROPLASTY Right 07/18/2013   Procedure: COMPUTER ASSISTED RIGHT TOTAL KNEE ARTHROPLASTY;  Surgeon: Kerrin Champagne, MD;  Location: [facility] OR;  Service: Orthopedics;  Laterality: Right;  . KNEE ARTHROSCOPY Right 07/16/2012   Procedure: ARTHROSCOPY KNEE;  Surgeon: Kerrin Champagne, MD;  Location: [facility];  Service: Orthopedics;  Laterality: Right;  Right knee arthroscopy with chondroplastic shaving of retropatella surface   Social History   Occupational History  . Not on file  Tobacco Use  . Smoking status: Current Some Day Smoker    Packs/day: 1.00    Years: 40.00    Pack years: 40.00    Types: Cigarettes  . Smokeless tobacco: Never Used  Substance and Sexual Activity  . Alcohol use: No  . Drug use: No  . Sexual activity: Not on file

## 2017-12-22 ENCOUNTER — Emergency Department (HOSPITAL_BASED_OUTPATIENT_CLINIC_OR_DEPARTMENT_OTHER)
Admission: EM | Admit: 2017-12-22 | Discharge: 2017-12-22 | Disposition: A | Discharge status: Home / Self Care | Payer: Medicare Other | Attending: Emergency Medicine | Admitting: Emergency Medicine

## 2017-12-22 ENCOUNTER — Encounter (HOSPITAL_BASED_OUTPATIENT_CLINIC_OR_DEPARTMENT_OTHER): Payer: Self-pay | Admitting: *Deleted

## 2017-12-22 ENCOUNTER — Other Ambulatory Visit: Payer: Self-pay

## 2017-12-22 ENCOUNTER — Emergency Department (HOSPITAL_BASED_OUTPATIENT_CLINIC_OR_DEPARTMENT_OTHER): Payer: Medicare Other

## 2017-12-22 DIAGNOSIS — E039 Hypothyroidism, unspecified: Secondary | ICD-10-CM | POA: Diagnosis not present

## 2017-12-22 DIAGNOSIS — W228XXA Striking against or struck by other objects, initial encounter: Secondary | ICD-10-CM | POA: Insufficient documentation

## 2017-12-22 DIAGNOSIS — F329 Major depressive disorder, single episode, unspecified: Secondary | ICD-10-CM | POA: Insufficient documentation

## 2017-12-22 DIAGNOSIS — Y998 Other external cause status: Secondary | ICD-10-CM | POA: Insufficient documentation

## 2017-12-22 DIAGNOSIS — Z96651 Presence of right artificial knee joint: Secondary | ICD-10-CM | POA: Insufficient documentation

## 2017-12-22 DIAGNOSIS — F419 Anxiety disorder, unspecified: Secondary | ICD-10-CM | POA: Diagnosis not present

## 2017-12-22 DIAGNOSIS — M2011 Hallux valgus (acquired), right foot: Secondary | ICD-10-CM | POA: Insufficient documentation

## 2017-12-22 DIAGNOSIS — F17210 Nicotine dependence, cigarettes, uncomplicated: Secondary | ICD-10-CM | POA: Diagnosis not present

## 2017-12-22 DIAGNOSIS — Y9389 Activity, other specified: Secondary | ICD-10-CM | POA: Insufficient documentation

## 2017-12-22 DIAGNOSIS — Z79899 Other long term (current) drug therapy: Secondary | ICD-10-CM | POA: Diagnosis not present

## 2017-12-22 DIAGNOSIS — S90111A Contusion of right great toe without damage to nail, initial encounter: Secondary | ICD-10-CM | POA: Insufficient documentation

## 2017-12-22 DIAGNOSIS — Y92009 Unspecified place in unspecified non-institutional (private) residence as the place of occurrence of the external cause: Secondary | ICD-10-CM | POA: Diagnosis not present

## 2017-12-22 DIAGNOSIS — J45909 Unspecified asthma, uncomplicated: Secondary | ICD-10-CM | POA: Diagnosis not present

## 2017-12-22 DIAGNOSIS — S99921A Unspecified injury of right foot, initial encounter: Secondary | ICD-10-CM | POA: Diagnosis present

## 2017-12-22 NOTE — ED Triage Notes (Signed)
Reports stubbed toe against dresser yesterday. Today toe is red and swollen

## 2017-12-22 NOTE — Discharge Instructions (Signed)
Buddy tape the toes and use the post-op shoe as needed for comfort. Ice and elevate foot throughout the day, using ice pack for no more than 20 minutes every hour.  Alternate between tylenol and ibuprofen as needed for pain relief. Follow up with your orthopedist in 5-7 days for recheck of symptoms. Return to the ER for changes or worsening symptoms. Monitor area for signs of infection to include, but not limited to: increasing pain, spreading redness, drainage/pus, worsening swelling, or fevers. Return to the ER if any of these signs/symptoms develop.

## 2017-12-22 NOTE — ED Provider Notes (Signed)
[facility] EMERGENCY DEPARTMENT Provider Note   CSN: [id number] Arrival date & time: 12/22/17  1958     History   Chief Complaint Chief Complaint  Patient presents with  . Toe Injury    HPI Patricia Sutton is a 72 y.o. female with a PMHx of anxiety, arthritis, chronic back pain, COPD, GERD, kidney stones, hypothyroidism, and other conditions listed below, who presents to the ED with complaints of right great toe pain, swelling, and bruising since yesterday after she stubbed her toe on a dresser.  Patient states that she accidentally struck her toe on the edge of a dresser, she did not have any wounds or break the skin open at the time of the incident.  She describes the pain as a 5/10 constant aching nonradiating right great toe pain that worsens with weightbearing and has been unrelieved with ibuprofen and ice.  Initially she started having bruising which was more of a purple color, but this morning it was slightly more reddish and she had more swelling, which caused a blister to appear on her toe.  She was concerned about the reddish appearance and the blister so she came in for evaluation.  She states that the swelling has improved slightly since this morning.  She denies any warmth to the area, red streaking, or drainage from the area.  She denies having any wounds yesterday after the incident.  She denies any fevers overnight, chills, numbness, tingling, focal weakness, or any other complaints at this time.  No known history of gout.  Her orthopedist is [name] at [facility].    The history is provided by the patient and medical records. No language interpreter was used.    Past Medical History:  Diagnosis Date  . Anxiety   . Arthritis   . Asthma   . Back pain, chronic   . Bone spur of other site    spine  . Chronic back pain    spurs on spine  . COPD (chronic obstructive pulmonary disease) (HCC)    denies SOB  . Depression    takes Effexor daily  . GERD  (gastroesophageal reflux disease)    takes Omeprazole daily  . History of bronchitis    last time about 6+yrs ago  . History of colon polyps   . History of kidney stones   . Hypothyroidism    takes Synthroid daily  . Insomnia    takes Trazodone nightly and Ambien  . Joint pain   . Joint swelling   . Nocturia   . Pneumonia    hx of;last time about 6+yrs ago  . PONV (postoperative nausea and vomiting)   . Renal insufficiency   . Sleep apnea    no CPAP; uses O2 at night 1l/min.  Marland Kitchen Urinary urgency     Patient Active Problem List   Diagnosis Date Noted  . Tobacco use disorder 03/15/2015  . UTI (lower urinary tract infection) 03/15/2015  . COPD (chronic obstructive pulmonary disease) (HCC) 03/15/2015  . Hypothyroidism 03/15/2015  . Chronic pain syndrome 03/15/2015  . Dehydration 03/15/2015  . Elevated troponin 03/14/2015  . Osteoarthritis of right knee 07/18/2013    Class: Chronic  . Loose body of right knee 07/16/2012    Class: Chronic    Past Surgical History:  Procedure Laterality Date  . ABDOMINAL HYSTERECTOMY     complete  . bladder tacked    . COLONOSCOPY    . ESOPHAGOGASTRODUODENOSCOPY    . KNEE ARTHROPLASTY Right  07/18/2013   Procedure: COMPUTER ASSISTED RIGHT TOTAL KNEE ARTHROPLASTY;  Surgeon: Kerrin Champagne, MD;  Location: [facility] OR;  Service: Orthopedics;  Laterality: Right;  . KNEE ARTHROSCOPY Right 07/16/2012   Procedure: ARTHROSCOPY KNEE;  Surgeon: Kerrin Champagne, MD;  Location: [facility];  Service: Orthopedics;  Laterality: Right;  Right knee arthroscopy with chondroplastic shaving of retropatella surface     OB History   None      Home Medications    Prior to Admission medications   Medication Sig Start Date End Date Taking? Authorizing Provider  albuterol (PROVENTIL) (2.5 MG/3ML) 0.083% nebulizer solution Take 2.5 mg by nebulization every 4 (four) hours as needed for wheezing or shortness of breath.    [provider]    amoxicillin-clavulanate (AUGMENTIN) 875-125 MG tablet Take 1 tablet by mouth every 12 (twelve) hours. 09/26/17   Petrucelli, Pleas Koch, PA-C  aspirin EC 81 MG tablet Take 1 tablet (81 mg total) by mouth daily. 03/16/15   Catarina Hartshorn, MD  azithromycin (ZITHROMAX) 250 MG tablet Take 1 tablet (250 mg total) by mouth daily. Take first 2 tablets together, then 1 every day until finished. 09/26/17   Petrucelli, Samantha R, PA-C  budesonide-formoterol (SYMBICORT) 160-4.5 MCG/ACT inhaler Inhale 2 puffs into the lungs 2 (two) times daily.    [provider]  levothyroxine (SYNTHROID, LEVOTHROID) 50 MCG tablet Take 1 tablet by mouth daily. 09/04/17   [provider]  lubiprostone (AMITIZA) 24 MCG capsule Take 1 tablet by mouth 2 (two) times daily.    [provider]  LYRICA 75 MG capsule Take 1 tablet by mouth 3 (three) times daily. 09/18/17   [provider]  meloxicam (MOBIC) 15 MG tablet TAKE 1 TABLET BY MOUTH EVERY MORNING WITH FOOD 06/22/17   Kerrin Champagne, MD  methocarbamol (ROBAXIN) 500 MG tablet Take 1 tablet (500 mg total) by mouth every 8 (eight) hours as needed for muscle spasms. 12/16/16   Molpus, John, MD  nabumetone (RELAFEN) 500 MG tablet TAKE 1 TABLET(500 MG) BY MOUTH TWICE DAILY 08/28/17   Kerrin Champagne, MD  omeprazole (PRILOSEC) 20 MG capsule Take 20 mg by mouth 2 (two) times daily.    [provider]  ondansetron (ZOFRAN ODT) 4 MG disintegrating tablet Take 1 tablet (4 mg total) by mouth every 8 (eight) hours as needed for nausea or vomiting. 09/26/17   Petrucelli, Samantha R, PA-C  oxyCODONE-acetaminophen (PERCOCET/ROXICET) 5-325 MG tablet Take 1 tablet by mouth every 6 (six) hours as needed for severe pain. 09/26/17   Petrucelli, Samantha R, PA-C  Pregabalin (LYRICA PO) Take by mouth.    [provider]  promethazine (PHENERGAN) 25 MG tablet Take 25 mg by mouth 3 (three) times daily as needed for nausea or vomiting.    [provider]   traMADol (ULTRAM) 50 MG tablet Take 1 tablet (50 mg total) by mouth every 6 (six) hours as needed. 08/10/17   Kerrin Champagne, MD  traMADol (ULTRAM) 50 MG tablet Take 1 tablet (50 mg total) by mouth every 12 (twelve) hours as needed. 12/20/17   Naida Sleight, PA-C  traZODone (DESYREL) 100 MG tablet Take 100 mg by mouth at bedtime.     [provider]  VIIBRYD 20 MG TABS Take 1 tablet by mouth daily. 09/05/17   [provider]  VOLTAREN 1 % GEL APPLY 2 TO 4 GRAMS TO THE AFFECTED AREA 2 TO 3 TIMES DAILY 04/26/17   Kerrin Champagne,  MD    Family History Family History  Problem Relation Age of Onset  . Heart attack Mother   . Stroke Mother   . Diabetes Mellitus II Mother   . Lymphoma Father     Social History Social History   Tobacco Use  . Smoking status: Current Some Day Smoker    Packs/day: 1.00    Years: 40.00    Pack years: 40.00    Types: Cigarettes  . Smokeless tobacco: Never Used  Substance Use Topics  . Alcohol use: No  . Drug use: No     Allergies   Patient has no known allergies.   Review of Systems Review of Systems  Constitutional: Negative for chills and fever.  Musculoskeletal: Positive for arthralgias and joint swelling.  Skin: Positive for color change. Negative for wound.  Allergic/Immunologic: Negative for immunocompromised state.  Neurological: Negative for weakness and numbness.     Physical Exam Updated Vital Signs BP (!) 177/78   Pulse 68   Temp 99 F (37.2 C) (Oral)   Resp 18   Ht 5\' 3"  (1.6 m)   Wt 49 kg (108 lb)   SpO2 96%   BMI 19.13 kg/m   Physical Exam  Constitutional: She is oriented to person, place, and time. Vital signs are normal. She appears well-developed and well-nourished.  Non-toxic appearance. No distress.  Afebrile, nontoxic, NAD  HENT:  Head: Normocephalic and atraumatic.  Mouth/Throat: Mucous membranes are normal.  Eyes: Conjunctivae and EOM are normal. Right eye exhibits no discharge. Left eye  exhibits no discharge.  Neck: Normal range of motion. Neck supple.  Cardiovascular: Normal rate and intact distal pulses.  Pulmonary/Chest: Effort normal. No respiratory distress.  Abdominal: Normal appearance. She exhibits no distension.  Musculoskeletal:       Right foot: There is decreased range of motion (of 1st toe due to pain), tenderness, bony tenderness and swelling. There is normal capillary refill, no crepitus, no deformity and no laceration.       Feet:  R great toe with mild bruising along the entire toe and extending towards the medial distal foot along the 1st metatarsal, somewhat of a purpleish/reddish bruise, no warmth or frank erythema, no induration or fluctuance, small blister on the top of the toe proximal to the nail around the area of the most bruising but no wounds/skin openings, mildly swollen, no red streaking, hallux valgus deformity but otherwise no deformities/crepitus noted, mild TTP to the mid-toe and somewhat in the 1st metatarsal region but not at the MTP joint, no warmth/erythema to the foot or 1st MTP joint. Wiggles all toes although this does cause some pain to the 1st toe, mildly limited ROM of the 1st toe due to pain. Sensation grossly intact, distal pulses intact, compartments soft. No other areas of tenderness to remainder of foot/ankle. SEE PICTURE BELOW  Neurological: She is alert and oriented to person, place, and time. She has normal strength. No sensory deficit.  Skin: Skin is warm, dry and intact. Bruising noted. No rash noted.  R great toe bruise as mentioned above and pictured below  Psychiatric: She has a normal mood and affect. Her behavior is normal.  Nursing note and vitals reviewed.      ED Treatments / Results  Labs (all labs ordered are listed, but only abnormal results are displayed) Labs Reviewed - No data to display  EKG None  Radiology Dg Foot Complete Right  Result Date: 12/22/2017 CLINICAL DATA:  Stubbed toe yesterday.  Discoloration, swelling  of great toe EXAM: RIGHT FOOT COMPLETE - 3+ VIEW COMPARISON:  None. FINDINGS: Degenerative changes at the 1st MTP joint with hallux valgus deformity. No acute bony abnormality. Specifically, no fracture, subluxation, or dislocation. IMPRESSION: No acute bony abnormality.  Hallux valgus. Electronically Signed   By: Charlett Nose M.D.   On: 12/22/2017 21:10    Procedures Procedures (including critical care time)  Medications Ordered in ED Medications - No data to display   Initial Impression / Assessment and Plan / ED Course  I have reviewed the triage vital signs and the nursing notes.  Pertinent labs & imaging results that were available during my care of the patient were reviewed by me and considered in my medical decision making (see chart for details).     72 y.o. female here with R great toe pain and bruising after stubbing it on a dresser yesterday. States today she developed a small blister and the bruise was more red, so she became concerned and came in for evaluation. On exam, R great toe with mild bruising along the entire toe and extending towards the medial foot, somewhat of a purpleish/reddish bruise, no warmth or frank erythema, small blister on the top of the toe behind the nail around the area of the most bruising, mildly swollen, no warmth, no red streaking, hallux valgus deformity but otherwise no deformities/crepitus noted, mild TTP to the mid-toe and somewhat in the 1st metatarsal region but not at the MTP joint, no induration or fluctuance, wiggles toe, NVI with soft compartments.  Xray negative for fx, shows degenerative changes around the MTP joint but otherwise no acute findings. Doubt infection/septic joint, likely just contusion and the swelling caused a small blister just from the amount of swelling; doubt gout. Will buddy tape the toes and place in postop shoe, pt declines wanting crutches. Doubt need for abx or further emergent work up at this time.  Advised RICE/tylenol/motrin for pain, and f/up with her orthopedist in 5-7 days for recheck. Strict return precautions advised. I explained the diagnosis and have given explicit precautions to return to the ER including for any other new or worsening symptoms. The patient understands and accepts the medical plan as it's been dictated and I have answered their questions. Discharge instructions concerning home care and prescriptions have been given. The patient is STABLE and is discharged to home in good condition.    Final Clinical Impressions(s) / ED Diagnoses   Final diagnoses:  Contusion of right great toe without damage to nail, initial encounter  Hallux valgus of right foot    ED Discharge Orders    [address] [place], [place] 12/22/17 2328    Raeford Razor, MD 12/23/17 [phone]

## 2017-12-22 NOTE — ED Notes (Signed)
Pt ambulatory to d/c window. Unable to obtain e-signature due to sig pad not working

## 2017-12-22 NOTE — ED Notes (Signed)
Pt d/c home with ride 

## 2018-01-10 ENCOUNTER — Other Ambulatory Visit (INDEPENDENT_AMBULATORY_CARE_PROVIDER_SITE_OTHER): Payer: Self-pay | Admitting: Specialist

## 2018-01-10 MED ORDER — TRAMADOL HCL 50 MG PO TABS: 50.0000 mg | ORAL_TABLET | Freq: Two times a day (BID) | ORAL | 0 refills | Status: DC | PRN

## 2018-01-10 NOTE — Telephone Encounter (Signed)
Sent request to Dr. Nitka 

## 2018-01-10 NOTE — Telephone Encounter (Signed)
[facility]

## 2018-01-10 NOTE — Telephone Encounter (Signed)
Patient called left voicemail message needing Rx refilled (Tramadol)  The number to contact patient is [phone]

## 2018-01-18 ENCOUNTER — Emergency Department (HOSPITAL_BASED_OUTPATIENT_CLINIC_OR_DEPARTMENT_OTHER): Payer: No Typology Code available for payment source

## 2018-01-18 ENCOUNTER — Other Ambulatory Visit: Payer: Self-pay

## 2018-01-18 ENCOUNTER — Encounter (HOSPITAL_BASED_OUTPATIENT_CLINIC_OR_DEPARTMENT_OTHER): Payer: Self-pay | Admitting: *Deleted

## 2018-01-18 ENCOUNTER — Emergency Department (HOSPITAL_BASED_OUTPATIENT_CLINIC_OR_DEPARTMENT_OTHER)
Admission: EM | Admit: 2018-01-18 | Discharge: 2018-01-18 | Disposition: A | Discharge status: Home / Self Care | Payer: No Typology Code available for payment source | Attending: Emergency Medicine | Admitting: Emergency Medicine

## 2018-01-18 DIAGNOSIS — R0789 Other chest pain: Secondary | ICD-10-CM | POA: Diagnosis not present

## 2018-01-18 DIAGNOSIS — S161XXA Strain of muscle, fascia and tendon at neck level, initial encounter: Secondary | ICD-10-CM | POA: Diagnosis not present

## 2018-01-18 DIAGNOSIS — Y999 Unspecified external cause status: Secondary | ICD-10-CM | POA: Diagnosis not present

## 2018-01-18 DIAGNOSIS — M25552 Pain in left hip: Secondary | ICD-10-CM | POA: Insufficient documentation

## 2018-01-18 DIAGNOSIS — M25551 Pain in right hip: Secondary | ICD-10-CM | POA: Insufficient documentation

## 2018-01-18 DIAGNOSIS — R51 Headache: Secondary | ICD-10-CM | POA: Diagnosis not present

## 2018-01-18 DIAGNOSIS — E039 Hypothyroidism, unspecified: Secondary | ICD-10-CM | POA: Diagnosis not present

## 2018-01-18 DIAGNOSIS — J449 Chronic obstructive pulmonary disease, unspecified: Secondary | ICD-10-CM | POA: Diagnosis not present

## 2018-01-18 DIAGNOSIS — Y92410 Unspecified street and highway as the place of occurrence of the external cause: Secondary | ICD-10-CM | POA: Insufficient documentation

## 2018-01-18 DIAGNOSIS — S39012A Strain of muscle, fascia and tendon of lower back, initial encounter: Secondary | ICD-10-CM

## 2018-01-18 DIAGNOSIS — S1980XA Other specified injuries of unspecified part of neck, initial encounter: Secondary | ICD-10-CM | POA: Diagnosis present

## 2018-01-18 DIAGNOSIS — Y9389 Activity, other specified: Secondary | ICD-10-CM | POA: Diagnosis not present

## 2018-01-18 DIAGNOSIS — F17210 Nicotine dependence, cigarettes, uncomplicated: Secondary | ICD-10-CM | POA: Diagnosis not present

## 2018-01-18 MED ORDER — ONDANSETRON 4 MG PO TBDP
4.0000 mg | ORAL_TABLET | Freq: Once | ORAL | Status: AC
  Administered 2018-01-18: 4 mg via ORAL

## 2018-01-18 MED ORDER — ONDANSETRON HCL 8 MG PO TABS
4.0000 mg | ORAL_TABLET | Freq: Once | ORAL | Status: DC
Start: 2018-01-18 — End: 2018-01-18

## 2018-01-18 MED ORDER — TRAMADOL HCL 50 MG PO TABS
50.0000 mg | ORAL_TABLET | Freq: Once | ORAL | Status: AC
  Administered 2018-01-18: 50 mg via ORAL

## 2018-01-18 MED ORDER — TRAMADOL HCL 50 MG PO TABS
ORAL_TABLET | ORAL | Status: AC
  Filled 2018-01-18: qty 1

## 2018-01-18 MED ORDER — ONDANSETRON 4 MG PO TBDP
ORAL_TABLET | ORAL | Status: AC
  Administered 2018-01-18: 4 mg via ORAL
  Filled 2018-01-18: qty 1
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
## 2018-01-18 NOTE — Discharge Instructions (Signed)

## 2018-01-22 ENCOUNTER — Emergency Department (HOSPITAL_BASED_OUTPATIENT_CLINIC_OR_DEPARTMENT_OTHER)
Admission: EM | Admit: 2018-01-22 | Discharge: 2018-01-22 | Disposition: A | Discharge status: Home / Self Care | Payer: No Typology Code available for payment source | Attending: Emergency Medicine | Admitting: Emergency Medicine

## 2018-01-22 ENCOUNTER — Emergency Department (HOSPITAL_BASED_OUTPATIENT_CLINIC_OR_DEPARTMENT_OTHER): Payer: No Typology Code available for payment source

## 2018-01-22 ENCOUNTER — Encounter (HOSPITAL_BASED_OUTPATIENT_CLINIC_OR_DEPARTMENT_OTHER): Payer: Self-pay | Admitting: *Deleted

## 2018-01-22 ENCOUNTER — Other Ambulatory Visit: Payer: Self-pay

## 2018-01-22 DIAGNOSIS — R0789 Other chest pain: Secondary | ICD-10-CM | POA: Insufficient documentation

## 2018-01-22 DIAGNOSIS — Y92410 Unspecified street and highway as the place of occurrence of the external cause: Secondary | ICD-10-CM | POA: Insufficient documentation

## 2018-01-22 DIAGNOSIS — E039 Hypothyroidism, unspecified: Secondary | ICD-10-CM | POA: Insufficient documentation

## 2018-01-22 DIAGNOSIS — G8929 Other chronic pain: Secondary | ICD-10-CM

## 2018-01-22 DIAGNOSIS — M25511 Pain in right shoulder: Secondary | ICD-10-CM | POA: Diagnosis not present

## 2018-01-22 DIAGNOSIS — Z7982 Long term (current) use of aspirin: Secondary | ICD-10-CM | POA: Diagnosis not present

## 2018-01-22 DIAGNOSIS — Z96651 Presence of right artificial knee joint: Secondary | ICD-10-CM | POA: Insufficient documentation

## 2018-01-22 DIAGNOSIS — Y9389 Activity, other specified: Secondary | ICD-10-CM | POA: Insufficient documentation

## 2018-01-22 DIAGNOSIS — M545 Low back pain, unspecified: Secondary | ICD-10-CM

## 2018-01-22 DIAGNOSIS — Y999 Unspecified external cause status: Secondary | ICD-10-CM | POA: Diagnosis not present

## 2018-01-22 DIAGNOSIS — Z79899 Other long term (current) drug therapy: Secondary | ICD-10-CM | POA: Diagnosis not present

## 2018-01-22 DIAGNOSIS — F17210 Nicotine dependence, cigarettes, uncomplicated: Secondary | ICD-10-CM | POA: Diagnosis not present

## 2018-01-22 DIAGNOSIS — J449 Chronic obstructive pulmonary disease, unspecified: Secondary | ICD-10-CM | POA: Insufficient documentation

## 2018-01-22 MED ORDER — OXYCODONE-ACETAMINOPHEN 5-325 MG PO TABS
1.0000 | ORAL_TABLET | Freq: Once | ORAL | Status: AC
  Administered 2018-01-22: 1 via ORAL
  Filled 2018-01-22: qty 1

## 2018-01-22 MED ORDER — CYCLOBENZAPRINE HCL 5 MG PO TABS: 10.0000 mg | ORAL_TABLET | Freq: Two times a day (BID) | ORAL | 0 refills | Status: AC | PRN

## 2018-01-22 MED ORDER — IBUPROFEN 800 MG PO TABS
800.0000 mg | ORAL_TABLET | Freq: Once | ORAL | Status: AC
  Administered 2018-01-22: 800 mg via ORAL
  Filled 2018-01-22: qty 1

## 2018-01-22 MED ORDER — ONDANSETRON 4 MG PO TBDP
4.0000 mg | ORAL_TABLET | Freq: Once | ORAL | Status: AC
  Administered 2018-01-22: 4 mg via ORAL
  Filled 2018-01-22: qty 1

## 2018-01-22 MED ORDER — CYCLOBENZAPRINE HCL 10 MG PO TABS
10.0000 mg | ORAL_TABLET | Freq: Once | ORAL | Status: AC
  Administered 2018-01-22: 10 mg via ORAL
  Filled 2018-01-22: qty 1
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
## 2018-01-22 NOTE — Discharge Instructions (Addendum)
Your CT scans look good. There are no fractures or disc herniations seen. There are degenerative changes at L4-L5 which is chronic.  You have a standing prescription for Tramadol at home. Please continue to take this as prescribed. You may also add Tylenol (650mg ) three to four times a day as well. You may find relief from warm compresses or heating pad to your back.  Follow-up with your orthopedic doctor as you are normally scheduled to.  I wish you a quick recovery!
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
## 2018-01-29 ENCOUNTER — Other Ambulatory Visit (INDEPENDENT_AMBULATORY_CARE_PROVIDER_SITE_OTHER): Payer: Self-pay | Admitting: Specialist

## 2018-01-29 DIAGNOSIS — M545 Low back pain, unspecified: Secondary | ICD-10-CM | POA: Insufficient documentation

## 2018-01-29 MED ORDER — TRAMADOL HCL 50 MG PO TABS: 50.0000 mg | ORAL_TABLET | Freq: Two times a day (BID) | ORAL | 0 refills | Status: DC | PRN

## 2018-01-29 NOTE — Telephone Encounter (Signed)
Patient left a message requesting an RX refill on her Tramadol.  CB#[phone].  Thank you.

## 2018-01-30 NOTE — Telephone Encounter (Signed)
Called to pharm 

## 2018-02-06 ENCOUNTER — Ambulatory Visit (INDEPENDENT_AMBULATORY_CARE_PROVIDER_SITE_OTHER): Payer: Medicare Other | Admitting: Specialist

## 2018-02-06 ENCOUNTER — Encounter (INDEPENDENT_AMBULATORY_CARE_PROVIDER_SITE_OTHER): Payer: Self-pay | Admitting: Specialist

## 2018-02-06 ENCOUNTER — Ambulatory Visit (INDEPENDENT_AMBULATORY_CARE_PROVIDER_SITE_OTHER): Payer: Medicare Other

## 2018-02-06 ENCOUNTER — Telehealth (INDEPENDENT_AMBULATORY_CARE_PROVIDER_SITE_OTHER): Payer: Self-pay | Admitting: Specialist

## 2018-02-06 VITALS — BP 136/80 | HR 59 | Ht 63.0 in | Wt 107.0 lb

## 2018-02-06 DIAGNOSIS — M5442 Lumbago with sciatica, left side: Secondary | ICD-10-CM | POA: Diagnosis not present

## 2018-02-06 DIAGNOSIS — S22000D Wedge compression fracture of unspecified thoracic vertebra, subsequent encounter for fracture with routine healing: Secondary | ICD-10-CM | POA: Diagnosis not present

## 2018-02-06 DIAGNOSIS — M7051 Other bursitis of knee, right knee: Secondary | ICD-10-CM | POA: Diagnosis not present

## 2018-02-06 MED ORDER — BUPIVACAINE HCL 0.25 % IJ SOLN
2.0000 mL | INTRAMUSCULAR | Status: AC | PRN
  Administered 2018-02-06: 2 mL via INTRA_ARTICULAR

## 2018-02-06 MED ORDER — METHYLPREDNISOLONE ACETATE 40 MG/ML IJ SUSP
40.0000 mg | INTRAMUSCULAR | Status: AC | PRN
  Administered 2018-02-06: 40 mg via INTRA_ARTICULAR

## 2018-02-06 MED ORDER — LIDOCAINE HCL (PF) 1 % IJ SOLN
2.0000 mL | INTRAMUSCULAR | Status: AC | PRN
  Administered 2018-02-06: 2 mL

## 2018-02-06 MED ORDER — HYDROCODONE-ACETAMINOPHEN 5-325 MG PO TABS: 1.0000 | ORAL_TABLET | Freq: Four times a day (QID) | ORAL | 0 refills | Status: DC | PRN

## 2018-02-06 NOTE — Telephone Encounter (Signed)
Patient would like to know if it is okay to start physical therapy even though [name] found the fx. She just wanted to verify this was ok. Please advise # [phone]

## 2018-02-06 NOTE — Patient Instructions (Signed)
Avoid frequent bending and stooping  No lifting greater than 10 lbs. May use ice or moist heat for pain. Weight loss is of benefit. Best medication for lumbar disc disease is arthritis medications like motrin, celebrex and naprosyn. Exercise is important to improve your indurance and does allow people to function better inspite of back pain.  The main ways of treat osteoarthritis, that are found to be success. Weight loss helps to decrease pain. Exercise is important to maintaining cartilage and thickness and strengthening. NSAIDs like voltaren gel is helpful decreasing the inflamation. Ice is okay  In afternoon and evening and hot shower in the am
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
## 2018-02-06 NOTE — Telephone Encounter (Signed)
Patient would like to know if it is okay to start physical therapy even though Dr. Otelia Sutton found the fx. She just wanted to verify this was ok. -------Please advise
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
## 2018-02-12 ENCOUNTER — Other Ambulatory Visit (INDEPENDENT_AMBULATORY_CARE_PROVIDER_SITE_OTHER): Payer: Self-pay | Admitting: Specialist

## 2018-02-12 MED ORDER — HYDROCODONE-ACETAMINOPHEN 5-325 MG PO TABS: 1.0000 | ORAL_TABLET | Freq: Four times a day (QID) | ORAL | 0 refills | Status: DC | PRN

## 2018-02-12 NOTE — Telephone Encounter (Signed)
[facility]   [address]   Medication Refill   Hydrocodone

## 2018-02-12 NOTE — Telephone Encounter (Signed)
Sent request to Dr. Nitka 

## 2018-02-12 NOTE — Telephone Encounter (Signed)
Pt is aware her rx is ready for pick up at the front desk 

## 2018-02-16 ENCOUNTER — Ambulatory Visit (HOSPITAL_BASED_OUTPATIENT_CLINIC_OR_DEPARTMENT_OTHER)
Admission: RE | Admit: 2018-02-16 | Discharge: 2018-02-16 | Disposition: A | Discharge status: Home / Self Care | Payer: Medicare Other | Source: Ambulatory Visit | Attending: Specialist | Admitting: Specialist

## 2018-02-16 DIAGNOSIS — M47814 Spondylosis without myelopathy or radiculopathy, thoracic region: Secondary | ICD-10-CM | POA: Diagnosis not present

## 2018-02-16 DIAGNOSIS — M5442 Lumbago with sciatica, left side: Secondary | ICD-10-CM | POA: Diagnosis present

## 2018-02-21 ENCOUNTER — Other Ambulatory Visit (INDEPENDENT_AMBULATORY_CARE_PROVIDER_SITE_OTHER): Payer: Self-pay | Admitting: Specialist

## 2018-02-21 NOTE — Telephone Encounter (Signed)
Patient requesting rx refill on hydrocodone. # [phone]

## 2018-02-21 NOTE — Telephone Encounter (Signed)
Sent to Dr. Nitka ° ° °

## 2018-02-25 ENCOUNTER — Ambulatory Visit (INDEPENDENT_AMBULATORY_CARE_PROVIDER_SITE_OTHER): Payer: Medicare Other | Admitting: Specialist

## 2018-02-25 ENCOUNTER — Encounter (INDEPENDENT_AMBULATORY_CARE_PROVIDER_SITE_OTHER): Payer: Self-pay | Admitting: Specialist

## 2018-02-25 VITALS — BP 149/78 | HR 56 | Ht 63.0 in | Wt 107.0 lb

## 2018-02-25 DIAGNOSIS — S239XXS Sprain of unspecified parts of thorax, sequela: Secondary | ICD-10-CM | POA: Diagnosis not present

## 2018-02-25 DIAGNOSIS — S335XXS Sprain of ligaments of lumbar spine, sequela: Secondary | ICD-10-CM | POA: Diagnosis not present

## 2018-02-25 DIAGNOSIS — M7051 Other bursitis of knee, right knee: Secondary | ICD-10-CM

## 2018-02-25 MED ORDER — TRAMADOL HCL 50 MG PO TABS: 50.0000 mg | ORAL_TABLET | Freq: Two times a day (BID) | ORAL | 0 refills | Status: DC | PRN

## 2018-02-25 NOTE — Patient Instructions (Signed)
The main ways of treat osteoarthritis, that are found to be success. Weight loss helps to decrease pain. Exercise is important to maintaining cartilage and thickness and strengthening. NSAIDs like motrin, tylenol, alleve, motrinare meds decreasing the inflamation. Ice is okay  In afternoon and evening and hot shower in the am
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
## 2018-03-15 ENCOUNTER — Other Ambulatory Visit (INDEPENDENT_AMBULATORY_CARE_PROVIDER_SITE_OTHER): Payer: Self-pay | Admitting: Specialist

## 2018-03-15 NOTE — Telephone Encounter (Signed)
Tramadol refill request 

## 2018-03-19 NOTE — Telephone Encounter (Signed)
Called to [facility] 

## 2018-04-08 ENCOUNTER — Telehealth (INDEPENDENT_AMBULATORY_CARE_PROVIDER_SITE_OTHER): Payer: Self-pay | Admitting: Specialist

## 2018-04-08 NOTE — Telephone Encounter (Signed)
Patient called asked if Dr Otelia Sergeant can prescribe her a stronger gel medication for her legs. Patient said the 1% gel is not working very well. Patient said she is still in severe pain.

## 2018-04-08 NOTE — Telephone Encounter (Signed)
Patient called asked if Dr Patricia Sutton can prescribe her a stronger gel medication for her legs. Patient said the 1% gel is not working very well. Patient said she is still in severe pain. The number to contact patient is [phone]

## 2018-04-09 NOTE — Telephone Encounter (Signed)
Insurance will not cover the stronger get that is diclofenac. She may wish to consider CBD oil capsules on line with [employer] 5,000 mg in 120 capsules. One to two tablets per day. This has anecdotal benefit in some of our patients.

## 2018-04-10 NOTE — Telephone Encounter (Signed)
I called and advised patient of message from Dr. Havery MorosNikta

## 2018-04-11 ENCOUNTER — Other Ambulatory Visit (INDEPENDENT_AMBULATORY_CARE_PROVIDER_SITE_OTHER): Payer: Self-pay | Admitting: Specialist

## 2018-04-11 NOTE — Telephone Encounter (Signed)
Tramadol refill request 

## 2018-04-29 ENCOUNTER — Other Ambulatory Visit (INDEPENDENT_AMBULATORY_CARE_PROVIDER_SITE_OTHER): Payer: Self-pay | Admitting: Specialist

## 2018-04-29 ENCOUNTER — Ambulatory Visit (INDEPENDENT_AMBULATORY_CARE_PROVIDER_SITE_OTHER): Payer: Medicare Other | Admitting: Specialist

## 2018-04-29 ENCOUNTER — Encounter (INDEPENDENT_AMBULATORY_CARE_PROVIDER_SITE_OTHER): Payer: Self-pay | Admitting: Specialist

## 2018-04-29 VITALS — BP 139/73 | HR 62 | Ht 66.0 in | Wt 108.0 lb

## 2018-04-29 DIAGNOSIS — Z96651 Presence of right artificial knee joint: Secondary | ICD-10-CM

## 2018-04-29 DIAGNOSIS — G8929 Other chronic pain: Secondary | ICD-10-CM | POA: Diagnosis not present

## 2018-04-29 DIAGNOSIS — M7051 Other bursitis of knee, right knee: Secondary | ICD-10-CM

## 2018-04-29 DIAGNOSIS — T8484XD Pain due to internal orthopedic prosthetic devices, implants and grafts, subsequent encounter: Secondary | ICD-10-CM

## 2018-04-29 DIAGNOSIS — M1712 Unilateral primary osteoarthritis, left knee: Secondary | ICD-10-CM | POA: Diagnosis not present

## 2018-04-29 DIAGNOSIS — M5442 Lumbago with sciatica, left side: Secondary | ICD-10-CM | POA: Diagnosis not present

## 2018-04-29 MED ORDER — HYDROCODONE-ACETAMINOPHEN 5-325 MG PO TABS: 1.0000 | ORAL_TABLET | Freq: Four times a day (QID) | ORAL | 0 refills | Status: DC | PRN

## 2018-04-29 NOTE — Patient Instructions (Addendum)
Avoid frequent bending and stooping  No lifting greater than 10 lbs. May use ice or moist heat for pain. Weight loss is of benefit. Best medication for lumbar disc disease is arthritis medications like motrin, celebrex and naprosyn. Exercise is important to improve your indurance and does allow people to function better inspite of back pain. The cause of your back and leg pain is not apparent, I am ordering a bone scan of the spine and bilateral lower extremities. Laboratory tests for Rheumatoid Factor, ANA, sed rate and uric acid are ordered as well as a CPK level

## 2018-04-29 NOTE — Telephone Encounter (Signed)
Diclofenac Gel refill request 

## 2018-04-29 NOTE — Progress Notes (Signed)
Office Visit Note   Patient: Patricia Sutton           Date of Birth: [birth year]           MRN: [id number] Visit Date: 04/29/2018              Requested by: No referring provider defined for this encounter. PCP: Patient, No Pcp Per   Assessment & Plan: Visit Diagnoses:  1. Chronic bilateral low back pain with left-sided sciatica   2. Other bursitis of knee, right knee   3. Unilateral primary osteoarthritis, left knee   4. 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 May use ice or moist heat for pain. Weight loss is of benefit. Best medication for lumbar disc disease is arthritis medications like motrin, celebrex and naprosyn. Exercise is important to improve your indurance and does allow people to function better inspite of back pain. The cause of your back and leg pain is not apparent, I am ordering a bone scan of the spine and bilateral lower extremities. Laboratory tests for Rheumatoid Factor, ANA, sed rate and uric acid are ordered as well as a CPK   Follow-Up Instructions: Return in about 4 weeks (around 05/27/2018).   Orders:  Orders Placed This Encounter  Procedures  . Small Joint Inj  . Medium Joint Inj  . Large Joint Inj  . Large Joint Inj  . MR Lumbar Spine w/o contrast  . Sed Rate (ESR)  . Rheumatoid Factor  . ANA  . CK (Creatine Kinase)   Meds ordered this encounter  Medications  . HYDROcodone-acetaminophen (NORCO/VICODIN) 5-325 MG tablet    Sig: Take 1 tablet by mouth every 6 (six) hours as needed.    Dispense:  30 tablet    Refill:  0      Procedures: Large Joint Inj: L knee on 04/29/2018 5:31 PM Indications: pain Details: 25 G 1.5 in needle, anteromedial approach  Arthrogram: No  Medications: 40 mg methylPREDNISolone acetate 40 MG/ML; 2 mL bupivacaine 0.25 % Outcome: tolerated well, no immediate complications Procedure, treatment alternatives,  risks and benefits explained, specific risks discussed. Consent was given by the patient. Immediately prior to procedure a time out was called to verify the correct patient, procedure, equipment, support staff and site/side marked as required. Patient was prepped and draped in the usual sterile fashion.   Large Joint Inj: R knee on 04/29/2018 5:33 PM Details: anteromedial approach Medications: 2 mL bupivacaine 0.5 %; 40 mg methylPREDNISolone acetate 40 MG/ML      Clinical Data: No additional findings.   Subjective: Chief Complaint  Patient presents with  . Middle Back - Follow-up    C/o severe leg pain, tired of hurting, crying and saying she can't function for days at a time due to the pain.    72 year old female with history of low back pain with radiation into the legs from the mid thigh down. She is taking 50 mg tramadol and has tried the CBD oil and it does not work. The CBD seemed to help when the pain is not as bad. Bowel or bladder without difficulty. She reports she is usually strong but this pain is severe and she may have only had 4-5 days that were bearable and the tramadol doesn't help. .    Review of Systems  Constitutional: Negative.   HENT: Negative.   Eyes: Negative.  Respiratory: Negative.   Cardiovascular: Negative.   Gastrointestinal: Negative.   Endocrine: Negative.   Genitourinary: Negative.   Musculoskeletal: Negative.   Skin: Negative.   Allergic/Immunologic: Negative.   Neurological: Negative.   Hematological: Negative.   Psychiatric/Behavioral: Negative.      Objective: Vital Signs: BP 139/73   Pulse 62   Ht _0  (1.676 m)   Wt 108 lb (49 kg)   BMI 17.43 kg/m   Physical Exam  Constitutional: She is oriented to person, place, and time. She appears well-developed and well-nourished.  HENT:  Head: Normocephalic and atraumatic.  Eyes: Pupils are equal, round, and reactive to light. EOM are normal.  Neck: Normal range of motion. Neck  supple.  Pulmonary/Chest: Effort normal and breath sounds normal.  Abdominal: Soft. Bowel sounds are normal.  Musculoskeletal: Normal range of motion.  Neurological: She is alert and oriented to person, place, and time.  Skin: Skin is warm and dry.  Psychiatric: She has a normal mood and affect. Her behavior is normal. Judgment and thought content normal.    Back Exam   Tenderness  The patient is experiencing tenderness in the lumbar.  Range of Motion  Extension: normal  Flexion: normal   Muscle Strength  Right Quadriceps:  5/5  Left Quadriceps:  5/5  Right Hamstrings:  5/5  Left Hamstrings:  5/5   Reflexes  Patellar: normal Achilles: normal Biceps: normal Babinski's sign: normal       Specialty Comments:  No specialty comments available.  Imaging: No results found.   PMFS History: Patient Active Problem List   Diagnosis Date Noted  . Osteoarthritis of right knee 07/18/2013    Priority: High    Class: Chronic  . Loose body of right knee 07/16/2012    Priority: High    Class: Chronic  . Tobacco use disorder 03/15/2015  . UTI (lower urinary tract infection) 03/15/2015  . COPD (chronic obstructive pulmonary disease) ([place]) 03/15/2015  . Hypothyroidism 03/15/2015  . Chronic pain syndrome 03/15/2015  . Dehydration 03/15/2015  . Elevated troponin 03/14/2015   Past Medical History:  Diagnosis Date  . Anxiety   . Arthritis   . Asthma   . Back pain, chronic   . Bone spur of other site    spine  . Chronic back pain    spurs on spine  . COPD (chronic obstructive pulmonary disease) ([place])    denies SOB  . Depression    takes Effexor daily  . GERD (gastroesophageal reflux disease)    takes Omeprazole daily  . History of bronchitis    last time about 6+yrs ago  . History of colon polyps   . History of kidney stones   . Hypothyroidism    takes Synthroid daily  . Insomnia    takes Trazodone nightly and Ambien  . Joint pain   . Joint swelling   .  Nocturia   . Pneumonia    hx of;last time about 6+yrs ago  . PONV (postoperative nausea and vomiting)   . Renal insufficiency   . Sleep apnea    no CPAP; uses O2 at night 1l/min.  Marland Kitchen Urinary urgency     Family History  Problem Relation Age of Onset  . Heart attack Mother   . Stroke Mother   . Diabetes Mellitus II Mother   . Lymphoma Father     Past Surgical History:  Procedure Laterality Date  . ABDOMINAL HYSTERECTOMY     complete  . bladder tacked    .  COLONOSCOPY    . ESOPHAGOGASTRODUODENOSCOPY    . KNEE ARTHROPLASTY Right 07/18/2013   Procedure: COMPUTER ASSISTED RIGHT TOTAL KNEE ARTHROPLASTY;  Surgeon: Jessy Oto, MD;  Location: [facility];  Service: Orthopedics;  Laterality: Right;  . KNEE ARTHROSCOPY Right 07/16/2012   Procedure: ARTHROSCOPY KNEE;  Surgeon: Jessy Oto, MD;  Location: [facility];  Service: Orthopedics;  Laterality: Right;  Right knee arthroscopy with chondroplastic shaving of retropatella surface   Social History   Occupational History  . Not on file  Tobacco Use  . Smoking status: Current Some Day Smoker    Packs/day: 1.00    Years: 40.00    Pack years: 40.00    Types: Cigarettes  . Smokeless tobacco: Never Used  Substance and Sexual Activity  . Alcohol use: No  . Drug use: No  . Sexual activity: Not on file

## 2018-04-30 LAB — CK: Total CK: 70 U/L (ref 29–143)

## 2018-04-30 LAB — RHEUMATOID FACTOR: Rhuematoid fact SerPl-aCnc: <14 IU/mL (ref <14)

## 2018-04-30 LAB — SEDIMENTATION RATE: Sed Rate: 25 mm/h (ref 0–30)

## 2018-04-30 MED ORDER — METHYLPREDNISOLONE ACETATE 40 MG/ML IJ SUSP
40.0000 mg | INTRAMUSCULAR | Status: AC | PRN
  Administered 2018-04-29: 40 mg via INTRA_ARTICULAR

## 2018-04-30 MED ORDER — BUPIVACAINE HCL 0.25 % IJ SOLN
2.0000 mL | INTRAMUSCULAR | Status: AC | PRN
  Administered 2018-04-29: 2 mL via INTRA_ARTICULAR

## 2018-04-30 MED ORDER — BUPIVACAINE HCL 0.5 % IJ SOLN
2.0000 mL | INTRAMUSCULAR | Status: AC | PRN
  Administered 2018-04-29: 2 mL via INTRA_ARTICULAR

## 2018-05-02 LAB — ANA: ANA Titer 1: NEGATIVE

## 2018-05-08 ENCOUNTER — Other Ambulatory Visit (INDEPENDENT_AMBULATORY_CARE_PROVIDER_SITE_OTHER): Payer: Self-pay | Admitting: Specialist

## 2018-05-08 DIAGNOSIS — T8484XD Pain due to internal orthopedic prosthetic devices, implants and grafts, subsequent encounter: Secondary | ICD-10-CM

## 2018-05-08 DIAGNOSIS — Z96651 Presence of right artificial knee joint: Secondary | ICD-10-CM

## 2018-05-08 DIAGNOSIS — M1712 Unilateral primary osteoarthritis, left knee: Secondary | ICD-10-CM

## 2018-05-08 DIAGNOSIS — M5442 Lumbago with sciatica, left side: Principal | ICD-10-CM

## 2018-05-08 DIAGNOSIS — G8929 Other chronic pain: Secondary | ICD-10-CM

## 2018-05-08 MED ORDER — HYDROCODONE-ACETAMINOPHEN 5-325 MG PO TABS: 1.0000 | ORAL_TABLET | Freq: Four times a day (QID) | ORAL | 0 refills | Status: DC | PRN

## 2018-05-08 NOTE — Telephone Encounter (Signed)
Patient called for refill for Hydrocodone.  Please call patient to advise. [phone]

## 2018-05-08 NOTE — Telephone Encounter (Signed)
I called and advised patient that her rx is ready for pick up at the front desk. 

## 2018-05-08 NOTE — Telephone Encounter (Signed)
Give Ultram 50 mg 1 tab p.o. every 12 hours as needed pain number 50 tablets no refills 

## 2018-05-14 ENCOUNTER — Encounter (HOSPITAL_COMMUNITY)
Admission: RE | Admit: 2018-05-14 | Discharge: 2018-05-14 | Disposition: A | Discharge status: Home / Self Care | Payer: Medicare Other | Source: Ambulatory Visit | Attending: Specialist | Admitting: Specialist

## 2018-05-14 DIAGNOSIS — T8484XD Pain due to internal orthopedic prosthetic devices, implants and grafts, subsequent encounter: Secondary | ICD-10-CM

## 2018-05-14 DIAGNOSIS — M1712 Unilateral primary osteoarthritis, left knee: Secondary | ICD-10-CM | POA: Diagnosis not present

## 2018-05-14 DIAGNOSIS — Z96651 Presence of right artificial knee joint: Secondary | ICD-10-CM | POA: Insufficient documentation

## 2018-05-14 MED ORDER — TECHNETIUM TC 99M MEDRONATE IV KIT
20.0000 | PACK | Freq: Once | INTRAVENOUS | Status: AC | PRN
  Administered 2018-05-14: 20 via INTRAVENOUS

## 2018-05-20 ENCOUNTER — Other Ambulatory Visit (INDEPENDENT_AMBULATORY_CARE_PROVIDER_SITE_OTHER): Payer: Self-pay | Admitting: Specialist

## 2018-05-20 DIAGNOSIS — M5442 Lumbago with sciatica, left side: Principal | ICD-10-CM

## 2018-05-20 DIAGNOSIS — Z96651 Presence of right artificial knee joint: Secondary | ICD-10-CM

## 2018-05-20 DIAGNOSIS — T8484XD Pain due to internal orthopedic prosthetic devices, implants and grafts, subsequent encounter: Secondary | ICD-10-CM

## 2018-05-20 DIAGNOSIS — M1712 Unilateral primary osteoarthritis, left knee: Secondary | ICD-10-CM

## 2018-05-20 DIAGNOSIS — G8929 Other chronic pain: Secondary | ICD-10-CM

## 2018-05-20 MED ORDER — HYDROCODONE-ACETAMINOPHEN 5-325 MG PO TABS: 1.0000 | ORAL_TABLET | Freq: Two times a day (BID) | ORAL | 0 refills | Status: DC

## 2018-05-20 NOTE — Telephone Encounter (Signed)
Sent request to Dr. Nitka 

## 2018-05-20 NOTE — Telephone Encounter (Signed)
Patient called wanting refill for Hydocodone.  Please cal patient to advise.  [phone]

## 2018-05-31 ENCOUNTER — Other Ambulatory Visit (INDEPENDENT_AMBULATORY_CARE_PROVIDER_SITE_OTHER): Payer: Self-pay | Admitting: Specialist

## 2018-05-31 ENCOUNTER — Telehealth (INDEPENDENT_AMBULATORY_CARE_PROVIDER_SITE_OTHER): Payer: Self-pay | Admitting: Specialist

## 2018-05-31 ENCOUNTER — Ambulatory Visit (INDEPENDENT_AMBULATORY_CARE_PROVIDER_SITE_OTHER): Payer: Medicare Other | Admitting: Specialist

## 2018-05-31 MED ORDER — ACETAMINOPHEN-CODEINE #3 300-30 MG PO TABS: 1.0000 | ORAL_TABLET | Freq: Two times a day (BID) | ORAL | 0 refills | Status: DC | PRN

## 2018-05-31 NOTE — Telephone Encounter (Signed)
Ok to call in tylenol # 3  One po bid # 15. thanks

## 2018-05-31 NOTE — Telephone Encounter (Signed)
Patient called needing something called in for the pain she is in. The number to contact patient is [phone]

## 2018-05-31 NOTE — Telephone Encounter (Signed)
Called to pharmacy. Patient advised.  

## 2018-05-31 NOTE — Telephone Encounter (Signed)
Can you please advise for Dr. Nitka? 

## 2018-05-31 NOTE — Telephone Encounter (Signed)
Can you advise for Dr. Nitka? 

## 2018-06-03 ENCOUNTER — Telehealth (INDEPENDENT_AMBULATORY_CARE_PROVIDER_SITE_OTHER): Payer: Self-pay | Admitting: Orthopaedic Surgery

## 2018-06-03 NOTE — Telephone Encounter (Signed)
Can you please advise?

## 2018-06-03 NOTE — Telephone Encounter (Signed)
Patient's daughter in law called wanting to know why Dr Ophelia CharterYates prescribed Tylenol #3 for her instead of the Hydrocodone? She stated that the patient has been in bed for the last 4 days because the medication is not touching the pain.  She would like a returned phone call.  Patient's daughter in law [facility], [phone]#[phone].  Thank you.

## 2018-06-04 NOTE — Telephone Encounter (Signed)
I CALLED DISCUSSED.  

## 2018-06-04 NOTE — Telephone Encounter (Signed)
I called message left. Will call back later today

## 2018-06-06 ENCOUNTER — Other Ambulatory Visit (INDEPENDENT_AMBULATORY_CARE_PROVIDER_SITE_OTHER): Payer: Self-pay | Admitting: Orthopaedic Surgery

## 2018-06-06 NOTE — Telephone Encounter (Signed)
Please advise. OK for refill? 
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
## 2018-06-11 ENCOUNTER — Ambulatory Visit (INDEPENDENT_AMBULATORY_CARE_PROVIDER_SITE_OTHER): Payer: Medicare Other | Admitting: Specialist

## 2018-06-28 ENCOUNTER — Encounter (INDEPENDENT_AMBULATORY_CARE_PROVIDER_SITE_OTHER): Payer: Self-pay | Admitting: Specialist

## 2018-06-28 ENCOUNTER — Ambulatory Visit (INDEPENDENT_AMBULATORY_CARE_PROVIDER_SITE_OTHER): Payer: Medicare Other

## 2018-06-28 ENCOUNTER — Ambulatory Visit (INDEPENDENT_AMBULATORY_CARE_PROVIDER_SITE_OTHER): Payer: Medicare Other | Admitting: Specialist

## 2018-06-28 VITALS — BP 153/61 | HR 58 | Ht 66.0 in | Wt 108.0 lb

## 2018-06-28 DIAGNOSIS — M25561 Pain in right knee: Secondary | ICD-10-CM

## 2018-06-28 DIAGNOSIS — Z96651 Presence of right artificial knee joint: Secondary | ICD-10-CM | POA: Diagnosis not present

## 2018-06-28 DIAGNOSIS — M1712 Unilateral primary osteoarthritis, left knee: Secondary | ICD-10-CM

## 2018-06-28 DIAGNOSIS — G8929 Other chronic pain: Secondary | ICD-10-CM

## 2018-06-28 DIAGNOSIS — M5442 Lumbago with sciatica, left side: Secondary | ICD-10-CM

## 2018-06-28 DIAGNOSIS — T8484XD Pain due to internal orthopedic prosthetic devices, implants and grafts, subsequent encounter: Secondary | ICD-10-CM

## 2018-06-28 MED ORDER — HYDROCODONE-ACETAMINOPHEN 5-325 MG PO TABS: 1.0000 | ORAL_TABLET | Freq: Two times a day (BID) | ORAL | 0 refills | Status: DC
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
## 2018-07-03 ENCOUNTER — Telehealth (INDEPENDENT_AMBULATORY_CARE_PROVIDER_SITE_OTHER): Payer: Self-pay | Admitting: Specialist

## 2018-07-03 DIAGNOSIS — J471 Bronchiectasis with (acute) exacerbation: Secondary | ICD-10-CM | POA: Insufficient documentation

## 2018-07-03 DIAGNOSIS — J432 Centrilobular emphysema: Secondary | ICD-10-CM | POA: Insufficient documentation

## 2018-07-03 NOTE — Telephone Encounter (Signed)
Patient's daughter in law Melina Schools) called to get an MRI Review appointment with [name].  He does not have anything available until the end of February.  [name] expressed to her that he needed to see the patient within 2 weeks after her MRI.  Robyn's CB#[phone].  Thank you

## 2018-07-04 NOTE — Telephone Encounter (Signed)
I called and lmom that the only appt I have is on 07/26/2018 @ 845. And I advised that I will put her on the cancellation list and call them as soon as something opens sooner, --Her MRI is sched for 07/06/2018

## 2018-07-06 ENCOUNTER — Ambulatory Visit
Admission: RE | Admit: 2018-07-06 | Discharge: 2018-07-06 | Disposition: A | Discharge status: Home / Self Care | Payer: Medicare Other | Source: Ambulatory Visit | Attending: Specialist | Admitting: Specialist

## 2018-07-06 DIAGNOSIS — M25561 Pain in right knee: Secondary | ICD-10-CM

## 2018-07-06 MED ORDER — GADOBENATE DIMEGLUMINE 529 MG/ML IV SOLN
10.0000 mL | Freq: Once | INTRAVENOUS | Status: AC | PRN
  Administered 2018-07-06: 10 mL via INTRAVENOUS

## 2018-07-08 ENCOUNTER — Ambulatory Visit (INDEPENDENT_AMBULATORY_CARE_PROVIDER_SITE_OTHER): Payer: Medicare Other | Admitting: Specialist

## 2018-07-15 ENCOUNTER — Other Ambulatory Visit (INDEPENDENT_AMBULATORY_CARE_PROVIDER_SITE_OTHER): Payer: Self-pay | Admitting: Specialist

## 2018-07-15 MED ORDER — DICLOFENAC SODIUM 1 % TD GEL: TRANSDERMAL | 0 refills | Status: DC

## 2018-07-15 NOTE — Telephone Encounter (Signed)
Request has been sent to Dr. Nitka 

## 2018-07-15 NOTE — Telephone Encounter (Signed)
Patient having pain in legs and needing something for pain. Wanting something called in.  Please call patient to advise.

## 2018-07-15 NOTE — Telephone Encounter (Signed)
Pt called back saying she also needed a new RX on 1% topical gel for her legs.

## 2018-07-16 ENCOUNTER — Other Ambulatory Visit (INDEPENDENT_AMBULATORY_CARE_PROVIDER_SITE_OTHER): Payer: Self-pay | Admitting: Orthopaedic Surgery

## 2018-07-26 ENCOUNTER — Encounter (INDEPENDENT_AMBULATORY_CARE_PROVIDER_SITE_OTHER): Payer: Self-pay | Admitting: Specialist

## 2018-07-26 ENCOUNTER — Ambulatory Visit (INDEPENDENT_AMBULATORY_CARE_PROVIDER_SITE_OTHER): Payer: Medicare Other | Admitting: Specialist

## 2018-07-26 VITALS — BP 128/60 | HR 64 | Ht 66.0 in | Wt 108.0 lb

## 2018-07-26 DIAGNOSIS — Z96651 Presence of right artificial knee joint: Secondary | ICD-10-CM

## 2018-07-26 DIAGNOSIS — T8484XD Pain due to internal orthopedic prosthetic devices, implants and grafts, subsequent encounter: Secondary | ICD-10-CM | POA: Diagnosis not present

## 2018-07-26 DIAGNOSIS — M4815 Ankylosing hyperostosis [Forestier], thoracolumbar region: Secondary | ICD-10-CM | POA: Diagnosis not present

## 2018-07-26 DIAGNOSIS — M5416 Radiculopathy, lumbar region: Secondary | ICD-10-CM | POA: Diagnosis not present

## 2018-07-26 MED ORDER — TRAMADOL HCL 50 MG PO TABS: 50.0000 mg | ORAL_TABLET | Freq: Two times a day (BID) | ORAL | 0 refills | Status: DC | PRN

## 2018-07-26 MED ORDER — ALPRAZOLAM 0.5 MG PO TABS: ORAL_TABLET | ORAL | 0 refills | Status: DC

## 2018-07-26 NOTE — Patient Instructions (Signed)
Avoid frequent bending and stooping  No lifting greater than 10 lbs. May use ice or moist heat for pain. Weight loss is of benefit. Tramadol for discomfort MRI of the lumbar spine with and without contrast Xanax to take for the MRI. Exercise is important to improve your indurance and does allow people to function better inspite of back pain.
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
## 2018-08-09 ENCOUNTER — Other Ambulatory Visit (INDEPENDENT_AMBULATORY_CARE_PROVIDER_SITE_OTHER): Payer: Self-pay | Admitting: Specialist

## 2018-08-09 NOTE — Telephone Encounter (Signed)
Diclofenac gel refill

## 2018-08-10 ENCOUNTER — Other Ambulatory Visit: Payer: Self-pay

## 2018-08-10 ENCOUNTER — Ambulatory Visit
Admission: RE | Admit: 2018-08-10 | Discharge: 2018-08-10 | Disposition: A | Discharge status: Home / Self Care | Payer: Medicare Other | Source: Ambulatory Visit | Attending: Specialist | Admitting: Specialist

## 2018-08-10 DIAGNOSIS — T8484XD Pain due to internal orthopedic prosthetic devices, implants and grafts, subsequent encounter: Secondary | ICD-10-CM

## 2018-08-10 DIAGNOSIS — M5416 Radiculopathy, lumbar region: Secondary | ICD-10-CM

## 2018-08-10 DIAGNOSIS — Z96651 Presence of right artificial knee joint: Principal | ICD-10-CM

## 2018-08-10 DIAGNOSIS — M4815 Ankylosing hyperostosis [Forestier], thoracolumbar region: Secondary | ICD-10-CM

## 2018-08-10 MED ORDER — GADOBENATE DIMEGLUMINE 529 MG/ML IV SOLN
9.0000 mL | Freq: Once | INTRAVENOUS | Status: AC | PRN
  Administered 2018-08-10: 9 mL via INTRAVENOUS

## 2018-08-16 ENCOUNTER — Encounter (INDEPENDENT_AMBULATORY_CARE_PROVIDER_SITE_OTHER): Payer: Self-pay | Admitting: Specialist

## 2018-08-16 ENCOUNTER — Ambulatory Visit (INDEPENDENT_AMBULATORY_CARE_PROVIDER_SITE_OTHER): Payer: Medicare Other | Admitting: Specialist

## 2018-08-16 ENCOUNTER — Other Ambulatory Visit: Payer: Self-pay

## 2018-08-16 VITALS — BP 169/70 | HR 55 | Ht 66.0 in | Wt 108.0 lb

## 2018-08-16 DIAGNOSIS — F17210 Nicotine dependence, cigarettes, uncomplicated: Secondary | ICD-10-CM

## 2018-08-16 DIAGNOSIS — E039 Hypothyroidism, unspecified: Secondary | ICD-10-CM

## 2018-08-16 DIAGNOSIS — Z96651 Presence of right artificial knee joint: Secondary | ICD-10-CM | POA: Diagnosis not present

## 2018-08-16 DIAGNOSIS — M1711 Unilateral primary osteoarthritis, right knee: Secondary | ICD-10-CM

## 2018-08-16 DIAGNOSIS — M47816 Spondylosis without myelopathy or radiculopathy, lumbar region: Secondary | ICD-10-CM
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
## 2018-08-28 ENCOUNTER — Telehealth (INDEPENDENT_AMBULATORY_CARE_PROVIDER_SITE_OTHER): Payer: Self-pay | Admitting: Specialist

## 2018-08-28 NOTE — Telephone Encounter (Signed)
Patient request a Rx for Tramadol  [facility] on [address] in [facility]

## 2018-08-28 NOTE — Telephone Encounter (Signed)
Please advise.  Patient request a Rx for Tramadol  [facility] [address] in [facility]

## 2018-08-29 ENCOUNTER — Other Ambulatory Visit (INDEPENDENT_AMBULATORY_CARE_PROVIDER_SITE_OTHER): Payer: Self-pay | Admitting: Specialist

## 2018-08-29 MED ORDER — TRAMADOL HCL 50 MG PO TABS: 50.0000 mg | ORAL_TABLET | Freq: Two times a day (BID) | ORAL | 0 refills | Status: DC | PRN

## 2018-08-29 NOTE — Telephone Encounter (Signed)
Noted  

## 2018-08-29 NOTE — Telephone Encounter (Signed)
Rx sent to her pharmacy for tramadol.

## 2018-08-29 NOTE — Telephone Encounter (Signed)
I called and lmom for patient that her rx has been sent to her pharmacy

## 2018-09-12 ENCOUNTER — Other Ambulatory Visit (INDEPENDENT_AMBULATORY_CARE_PROVIDER_SITE_OTHER): Payer: Self-pay | Admitting: Specialist

## 2018-09-12 NOTE — Telephone Encounter (Signed)
Volteran Gel refill request

## 2018-10-03 ENCOUNTER — Other Ambulatory Visit (INDEPENDENT_AMBULATORY_CARE_PROVIDER_SITE_OTHER): Payer: Self-pay | Admitting: Specialist

## 2018-10-07 ENCOUNTER — Other Ambulatory Visit (INDEPENDENT_AMBULATORY_CARE_PROVIDER_SITE_OTHER): Payer: Self-pay | Admitting: Specialist

## 2018-10-07 NOTE — Telephone Encounter (Signed)
Tramadol refill request 

## 2018-10-08 ENCOUNTER — Encounter: Payer: Self-pay | Admitting: Physical Medicine and Rehabilitation

## 2018-10-17 ENCOUNTER — Encounter: Payer: Self-pay | Admitting: Physical Medicine and Rehabilitation

## 2018-10-30 ENCOUNTER — Other Ambulatory Visit (INDEPENDENT_AMBULATORY_CARE_PROVIDER_SITE_OTHER): Payer: Self-pay | Admitting: Specialist

## 2018-10-30 NOTE — Telephone Encounter (Signed)
Tramadol refill request 

## 2018-11-18 ENCOUNTER — Other Ambulatory Visit (INDEPENDENT_AMBULATORY_CARE_PROVIDER_SITE_OTHER): Payer: Self-pay | Admitting: Specialist

## 2018-11-18 NOTE — Telephone Encounter (Signed)
Tramadol and Diclofenac 1% refill request

## 2018-12-05 ENCOUNTER — Other Ambulatory Visit (INDEPENDENT_AMBULATORY_CARE_PROVIDER_SITE_OTHER): Payer: Self-pay | Admitting: Specialist

## 2018-12-05 NOTE — Telephone Encounter (Signed)
Tramadol refill request 

## 2018-12-17 ENCOUNTER — Ambulatory Visit: Payer: Self-pay

## 2018-12-17 ENCOUNTER — Encounter: Payer: Self-pay | Admitting: Physical Medicine and Rehabilitation

## 2018-12-17 ENCOUNTER — Other Ambulatory Visit: Payer: Self-pay

## 2018-12-17 ENCOUNTER — Ambulatory Visit (INDEPENDENT_AMBULATORY_CARE_PROVIDER_SITE_OTHER): Payer: Medicare Other | Admitting: Physical Medicine and Rehabilitation

## 2018-12-17 VITALS — BP 136/64 | HR 58

## 2018-12-17 DIAGNOSIS — M47816 Spondylosis without myelopathy or radiculopathy, lumbar region: Secondary | ICD-10-CM

## 2018-12-17 MED ORDER — METHYLPREDNISOLONE ACETATE 80 MG/ML IJ SUSP
80.0000 mg | Freq: Once | INTRAMUSCULAR | Status: AC
  Administered 2018-12-17: 80 mg

## 2018-12-17 NOTE — Progress Notes (Signed)
Numeric Pain Rating Scale and Functional Assessment Average Pain 10   In the last MONTH (on 0-10 scale) has pain interfered with the following?  1. General activity like being  able to carry out your everyday physical activities such as walking, climbing stairs, carrying groceries, or moving a chair?  Rating(10)   +Driver, -BT, -Dye Allergies. 

## 2018-12-23 ENCOUNTER — Other Ambulatory Visit (INDEPENDENT_AMBULATORY_CARE_PROVIDER_SITE_OTHER): Payer: Self-pay | Admitting: Specialist

## 2018-12-23 NOTE — Telephone Encounter (Signed)
Please advise 

## 2018-12-24 ENCOUNTER — Ambulatory Visit: Payer: Self-pay

## 2018-12-24 ENCOUNTER — Ambulatory Visit (INDEPENDENT_AMBULATORY_CARE_PROVIDER_SITE_OTHER): Payer: Medicare Other | Admitting: Physical Medicine and Rehabilitation

## 2018-12-24 ENCOUNTER — Encounter: Payer: Self-pay | Admitting: Physical Medicine and Rehabilitation

## 2018-12-24 VITALS — BP 123/64 | HR 65

## 2018-12-24 DIAGNOSIS — M47816 Spondylosis without myelopathy or radiculopathy, lumbar region: Secondary | ICD-10-CM

## 2018-12-24 MED ORDER — METHYLPREDNISOLONE ACETATE 80 MG/ML IJ SUSP
80.0000 mg | Freq: Once | INTRAMUSCULAR | Status: AC
  Administered 2018-12-24: 80 mg

## 2018-12-24 NOTE — Progress Notes (Signed)
 .  Numeric Pain Rating Scale and Functional Assessment Average Pain 8   In the last MONTH (on 0-10 scale) has pain interfered with the following?  1. General activity like being  able to carry out your everyday physical activities such as walking, climbing stairs, carrying groceries, or moving a chair?  Rating(5)   +Driver, -BT, -Dye Allergies.  

## 2018-12-25 NOTE — Procedures (Signed)
Lumbar Facet Joint Nerve Denervation  Patient: Patricia Sutton      Date of Birth: 12/25/1944 MRN: [id number] PCP: Patient, No Pcp Per      Visit Date: 12/17/2018   Universal Protocol:    Date/Time: 07/29/205:35 AM  Consent Given By: the patient  Position: PRONE  Additional Comments: Vital signs were monitored before and after the procedure. Patient was prepped and draped in the usual sterile fashion. The correct patient, procedure, and site was verified.   Injection Procedure Details:  Procedure Site One Meds Administered:  Meds ordered this encounter  Medications  . methylPREDNISolone acetate (DEPO-MEDROL) injection 80 mg     Laterality: Left  Location/Site:  L4-L5 L5-S1  Needle size: 18 G  Needle type: Radiofrequency cannula  Needle Placement: Along juncture of superior articular process and transverse pocess  Findings:  -Comments:  Procedure Details: For each desired target nerve, the corresponding transverse process (sacral ala for the L5 dorsal rami) was identified and the fluoroscope was positioned to square off the endplates of the corresponding vertebral body to achieve a true AP midline view.  The beam was then obliqued 15 to 20 degrees and caudally tilted 15 to 20 degrees to line up a trajectory along the target nerves. The skin over the target of the junction of superior articulating process and transverse process (sacral ala for the L5 dorsal rami) was infiltrated with 32ml of 1% Lidocaine without Epinephrine.  The 18 gauge 27mm active tip outer cannula was advanced in trajectory view to the target.  This procedure was repeated for each target nerve.  Then, for all levels, the outer cannula placement was fine-tuned and the position was then confirmed with bi-planar imaging.    Test stimulation was done both at sensory and motor levels to ensure there was no radicular stimulation. The target tissues were then infiltrated with 1 ml of 1% Lidocaine without  Epinephrine. Subsequently, a percutaneous neurotomy was carried out for 90 seconds at 80 degrees Celsius.  After the completion of the lesion, 1 ml of injectate was delivered. It was then repeated for each facet joint nerve mentioned above. Appropriate radiographs were obtained to verify the probe placement during the neurotomy.   Additional Comments:  The patient tolerated the procedure well Dressing: 2 x 2 sterile gauze and Band-Aid    Post-procedure details: Patient was observed during the procedure. Post-procedure instructions were reviewed.  Patient left the clinic in stable condition.

## 2018-12-25 NOTE — Progress Notes (Signed)
Patricia CommentVicky D Wedel - 73 y.o. female MRN [id number]  Date of birth: 12/25/1944  Office Visit Note: Visit Date: 12/24/2018 PCP: Patient, No Pcp Per Referred by: No ref. provider found  Subjective: Chief Complaint  Patient presents with  . Lower Back - Pain   HPI:  Patricia Sutton is a 73 y.o. female who comes in today For planned right-sided radiofrequency ablation of the L4-5 and L5-S1 facet joints.  Patient's had chronic worsening axial low back pain left more than right and successfully had left-sided radiofrequency ablation.  Prior to that she had double diagnostic blocks.  She had failed conservative care including activity management and physical therapy and medication management.  Is been a long-term problem for her.  X-ray imaging and MRI showing facet arthropathy without stenosis or nerve compression.  No radicular leg pain.  No myofascial pain.  Exam today consistent with facet mediated pain with pain on facet loading and extension.  She has good distal strength.  ROS Otherwise per HPI.  Assessment & Plan: Visit Diagnoses:  1. Spondylosis without myelopathy or radiculopathy, lumbar region     Plan: No additional findings.   Meds & Orders:  Meds ordered this encounter  Medications  . methylPREDNISolone acetate (DEPO-MEDROL) injection 80 mg    Orders Placed This Encounter  Procedures  . Radiofrequency,Lumbar  . XR C-ARM NO REPORT    Follow-up: Return if symptoms worsen or fail to improve.   Procedures: No procedures performed  Lumbar Facet Joint Nerve Denervation  Patient: Patricia Sutton      Date of Birth: 12/25/1944 MRN: [id number] PCP: Patient, No Pcp Per      Visit Date: 12/24/2018   Universal Protocol:    Date/Time: 07/29/205:32 AM  Consent Given By: the patient  Position: PRONE  Additional Comments: Vital signs were monitored before and after the procedure. Patient was prepped and draped in the usual sterile fashion. The correct patient, procedure, and  site was verified.   Injection Procedure Details:  Procedure Site One Meds Administered:  Meds ordered this encounter  Medications  . methylPREDNISolone acetate (DEPO-MEDROL) injection 80 mg     Laterality: Right  Location/Site:  L4-L5 L5-S1  Needle size: 18 G  Needle type: Radiofrequency cannula  Needle Placement: Along juncture of superior articular process and transverse pocess  Findings:  -Comments:  Procedure Details: For each desired target nerve, the corresponding transverse process (sacral ala for the L5 dorsal rami) was identified and the fluoroscope was positioned to square off the endplates of the corresponding vertebral body to achieve a true AP midline view.  The beam was then obliqued 15 to 20 degrees and caudally tilted 15 to 20 degrees to line up a trajectory along the target nerves. The skin over the target of the junction of superior articulating process and transverse process (sacral ala for the L5 dorsal rami) was infiltrated with 1ml of 1% Lidocaine without Epinephrine.  The 18 gauge 10mm active tip outer cannula was advanced in trajectory view to the target.  This procedure was repeated for each target nerve.  Then, for all levels, the outer cannula placement was fine-tuned and the position was then confirmed with bi-planar imaging.    Test stimulation was done both at sensory and motor levels to ensure there was no radicular stimulation. The target tissues were then infiltrated with 1 ml of 1% Lidocaine without Epinephrine. Subsequently, a percutaneous neurotomy was carried out for 90 seconds at 80 degrees Celsius.  After the completion of  the lesion, 1 ml of injectate was delivered. It was then repeated for each facet joint nerve mentioned above. Appropriate radiographs were obtained to verify the probe placement during the neurotomy.   Additional Comments:  The patient tolerated the procedure well Dressing: 2 x 2 sterile gauze and Band-Aid     Post-procedure details: Patient was observed during the procedure. Post-procedure instructions were reviewed.  Patient left the clinic in stable condition.      Clinical History: MRI LUMBAR SPINE WITHOUT AND WITH CONTRAST  TECHNIQUE: Multiplanar and multiecho pulse sequences of the lumbar spine were obtained without and with intravenous contrast.  CONTRAST:  35mL MULTIHANCE GADOBENATE DIMEGLUMINE 529 MG/ML IV SOLN  COMPARISON:  Radiography 01/22/2018.  MRI 05/11/2017.  FINDINGS: Segmentation: 5 lumbar type vertebral bodies as numbered previously.  Alignment:  Normal except for 1 mm of anterolisthesis at L5-S1.  Vertebrae:  No fracture or primary bone lesion.  Conus medullaris and cauda equina: Conus extends to the L1 level. Conus and cauda equina appear normal.  Paraspinal and other soft tissues: Negative  Disc levels:  T12-L1: Shallow left paracentral disc protrusion indents the thecal sac slightly but does not cause any neural compression. No change since the previous study.  L1-2 and L2-3: Normal disc spirit mild facet hypertrophy without edema or stenosis.  L3-4: Minimal bulging of the disc. Minimal facet hypertrophy. No stenosis.  L4-5: Mild bulging of the disc. Bilateral facet degeneration and hypertrophy. No compressive stenosis. Findings could contribute to back pain or referred facet syndrome pain.  L5-S1: 1 mm anterolisthesis. No disc abnormality. Bilateral facet degeneration worse on the left. No sign of neural compression. Findings could relate to back pain or referred facet syndrome pain.  Compared to the study of 2018, the findings are quite similar.  IMPRESSION: No change since 2018. No evidence of stenosis or neural compression. Lower lumbar degenerative changes, most notably that of facet degenerative disease at L4-5 and L5-S1 which could contribute to back pain or referred facet syndrome pain. Single most abnormal facet  joint is on the left at L5-S1.   Electronically Signed   By: Nelson Chimes M.D.   On: 08/10/2018 11:58     Objective:  VS:  HT:    WT:   BMI:     BP:123/64  HR:65bpm  TEMP: ( )  RESP:  Physical Exam  Ortho Exam Imaging: Xr C-arm No Report  Result Date: 12/24/2018 Please see Notes tab for imaging impression.

## 2018-12-25 NOTE — Procedures (Signed)
Lumbar Facet Joint Nerve Denervation  Patient: Patricia Sutton      Date of Birth: 12/25/1944 MRN: [id number] PCP: Patient, No Pcp Per      Visit Date: 12/24/2018   Universal Protocol:    Date/Time: 07/29/205:32 AM  Consent Given By: the patient  Position: PRONE  Additional Comments: Vital signs were monitored before and after the procedure. Patient was prepped and draped in the usual sterile fashion. The correct patient, procedure, and site was verified.   Injection Procedure Details:  Procedure Site One Meds Administered:  Meds ordered this encounter  Medications  . methylPREDNISolone acetate (DEPO-MEDROL) injection 80 mg     Laterality: Right  Location/Site:  L4-L5 L5-S1  Needle size: 18 G  Needle type: Radiofrequency cannula  Needle Placement: Along juncture of superior articular process and transverse pocess  Findings:  -Comments:  Procedure Details: For each desired target nerve, the corresponding transverse process (sacral ala for the L5 dorsal rami) was identified and the fluoroscope was positioned to square off the endplates of the corresponding vertebral body to achieve a true AP midline view.  The beam was then obliqued 15 to 20 degrees and caudally tilted 15 to 20 degrees to line up a trajectory along the target nerves. The skin over the target of the junction of superior articulating process and transverse process (sacral ala for the L5 dorsal rami) was infiltrated with 4ml of 1% Lidocaine without Epinephrine.  The 18 gauge 69mm active tip outer cannula was advanced in trajectory view to the target.  This procedure was repeated for each target nerve.  Then, for all levels, the outer cannula placement was fine-tuned and the position was then confirmed with bi-planar imaging.    Test stimulation was done both at sensory and motor levels to ensure there was no radicular stimulation. The target tissues were then infiltrated with 1 ml of 1% Lidocaine without  Epinephrine. Subsequently, a percutaneous neurotomy was carried out for 90 seconds at 80 degrees Celsius.  After the completion of the lesion, 1 ml of injectate was delivered. It was then repeated for each facet joint nerve mentioned above. Appropriate radiographs were obtained to verify the probe placement during the neurotomy.   Additional Comments:  The patient tolerated the procedure well Dressing: 2 x 2 sterile gauze and Band-Aid    Post-procedure details: Patient was observed during the procedure. Post-procedure instructions were reviewed.  Patient left the clinic in stable condition.

## 2018-12-25 NOTE — Progress Notes (Signed)
Patricia Sutton - 73 y.o. female MRN [id number]  Date of birth: 12/25/1944  Office Visit Note: Visit Date: 12/17/2018 PCP: Patient, No Pcp Per Referred by: Jessy Oto, MD  Subjective: Chief Complaint  Patient presents with  . Lower Back - Pain   HPI:  Patricia Sutton is a 73 y.o. female who comes in today For planned left-sided radiofrequency ablation of the L4-5 and L5-S1 facet joints.  Patient's had chronic worsening axial low back pain left more than right and successfully had double diagnostic medial branch blocks bilaterally. She had failed conservative care including activity management and physical therapy and medication management.  Is been a long-term problem for her.  X-ray imaging and MRI showing facet arthropathy without stenosis or nerve compression.  No radicular leg pain.  No myofascial pain.  Exam today consistent with facet mediated pain with pain on facet loading and extension.  She has good distal strength.  ROS Otherwise per HPI.  Assessment & Plan: Visit Diagnoses:  1. Spondylosis without myelopathy or radiculopathy, lumbar region     Plan: No additional findings.   Meds & Orders:  Meds ordered this encounter  Medications  . methylPREDNISolone acetate (DEPO-MEDROL) injection 80 mg    Orders Placed This Encounter  Procedures  . Radiofrequency,Lumbar  . XR C-ARM NO REPORT    Follow-up: Return if symptoms worsen or fail to improve, for Consider right side ablation.   Procedures: No procedures performed  Lumbar Facet Joint Nerve Denervation  Patient: Patricia Sutton      Date of Birth: 12/25/1944 MRN: [id number] PCP: Patient, No Pcp Per      Visit Date: 12/17/2018   Universal Protocol:    Date/Time: 07/29/205:35 AM  Consent Given By: the patient  Position: PRONE  Additional Comments: Vital signs were monitored before and after the procedure. Patient was prepped and draped in the usual sterile fashion. The correct patient, procedure, and site was  verified.   Injection Procedure Details:  Procedure Site One Meds Administered:  Meds ordered this encounter  Medications  . methylPREDNISolone acetate (DEPO-MEDROL) injection 80 mg     Laterality: Left  Location/Site:  L4-L5 L5-S1  Needle size: 18 G  Needle type: Radiofrequency cannula  Needle Placement: Along juncture of superior articular process and transverse pocess  Findings:  -Comments:  Procedure Details: For each desired target nerve, the corresponding transverse process (sacral ala for the L5 dorsal rami) was identified and the fluoroscope was positioned to square off the endplates of the corresponding vertebral body to achieve a true AP midline view.  The beam was then obliqued 15 to 20 degrees and caudally tilted 15 to 20 degrees to line up a trajectory along the target nerves. The skin over the target of the junction of superior articulating process and transverse process (sacral ala for the L5 dorsal rami) was infiltrated with 67ml of 1% Lidocaine without Epinephrine.  The 18 gauge 48mm active tip outer cannula was advanced in trajectory view to the target.  This procedure was repeated for each target nerve.  Then, for all levels, the outer cannula placement was fine-tuned and the position was then confirmed with bi-planar imaging.    Test stimulation was done both at sensory and motor levels to ensure there was no radicular stimulation. The target tissues were then infiltrated with 1 ml of 1% Lidocaine without Epinephrine. Subsequently, a percutaneous neurotomy was carried out for 90 seconds at 80 degrees Celsius.  After the completion of the lesion,  1 ml of injectate was delivered. It was then repeated for each facet joint nerve mentioned above. Appropriate radiographs were obtained to verify the probe placement during the neurotomy.   Additional Comments:  The patient tolerated the procedure well Dressing: 2 x 2 sterile gauze and Band-Aid    Post-procedure  details: Patient was observed during the procedure. Post-procedure instructions were reviewed.  Patient left the clinic in stable condition.      Clinical History: MRI LUMBAR SPINE WITHOUT AND WITH CONTRAST  TECHNIQUE: Multiplanar and multiecho pulse sequences of the lumbar spine were obtained without and with intravenous contrast.  CONTRAST:  9mL MULTIHANCE GADOBENATE DIMEGLUMINE 529 MG/ML IV SOLN  COMPARISON:  Radiography 01/22/2018.  MRI 05/11/2017.  FINDINGS: Segmentation: 5 lumbar type vertebral bodies as numbered previously.  Alignment:  Normal except for 1 mm of anterolisthesis at L5-S1.  Vertebrae:  No fracture or primary bone lesion.  Conus medullaris and cauda equina: Conus extends to the L1 level. Conus and cauda equina appear normal.  Paraspinal and other soft tissues: Negative  Disc levels:  T12-L1: Shallow left paracentral disc protrusion indents the thecal sac slightly but does not cause any neural compression. No change since the previous study.  L1-2 and L2-3: Normal disc spirit mild facet hypertrophy without edema or stenosis.  L3-4: Minimal bulging of the disc. Minimal facet hypertrophy. No stenosis.  L4-5: Mild bulging of the disc. Bilateral facet degeneration and hypertrophy. No compressive stenosis. Findings could contribute to back pain or referred facet syndrome pain.  L5-S1: 1 mm anterolisthesis. No disc abnormality. Bilateral facet degeneration worse on the left. No sign of neural compression. Findings could relate to back pain or referred facet syndrome pain.  Compared to the study of 2018, the findings are quite similar.  IMPRESSION: No change since 2018. No evidence of stenosis or neural compression. Lower lumbar degenerative changes, most notably that of facet degenerative disease at L4-5 and L5-S1 which could contribute to back pain or referred facet syndrome pain. Single most abnormal facet joint is on the left  at L5-S1.   Electronically Signed   By: Paulina FusiMark  Shogry M.D.   On: 08/10/2018 11:58     Objective:  VS:  HT:    WT:   BMI:     BP:136/64  HR:(!) 58bpm  TEMP: ( )  RESP:  Physical Exam  Ortho Exam Imaging: Xr C-arm No Report  Result Date: 12/24/2018 Please see Notes tab for imaging impression.

## 2019-01-14 ENCOUNTER — Other Ambulatory Visit (INDEPENDENT_AMBULATORY_CARE_PROVIDER_SITE_OTHER): Payer: Self-pay | Admitting: Specialist

## 2019-01-14 NOTE — Telephone Encounter (Signed)
Please advise 

## 2019-01-15 ENCOUNTER — Telehealth: Payer: Self-pay | Admitting: Physical Medicine and Rehabilitation

## 2019-01-15 NOTE — Telephone Encounter (Signed)
Called patient to advise and offered to schedule an appointment with Dr. Louanne Skye. She is going to see how her back feels in over the next week and will call back.

## 2019-01-15 NOTE — Telephone Encounter (Signed)
She should schedule follow up with Dr. Louanne Skye in 2 to 3 weeks, can still take effect over next week or so.

## 2019-01-30 ENCOUNTER — Other Ambulatory Visit (INDEPENDENT_AMBULATORY_CARE_PROVIDER_SITE_OTHER): Payer: Self-pay | Admitting: Specialist

## 2019-02-06 ENCOUNTER — Other Ambulatory Visit (INDEPENDENT_AMBULATORY_CARE_PROVIDER_SITE_OTHER): Payer: Self-pay | Admitting: Specialist

## 2019-02-28 ENCOUNTER — Other Ambulatory Visit (INDEPENDENT_AMBULATORY_CARE_PROVIDER_SITE_OTHER): Payer: Self-pay | Admitting: Specialist

## 2019-03-05 ENCOUNTER — Other Ambulatory Visit (INDEPENDENT_AMBULATORY_CARE_PROVIDER_SITE_OTHER): Payer: Self-pay | Admitting: Specialist

## 2019-03-13 ENCOUNTER — Emergency Department (HOSPITAL_BASED_OUTPATIENT_CLINIC_OR_DEPARTMENT_OTHER): Payer: Medicare Other

## 2019-03-13 ENCOUNTER — Emergency Department (HOSPITAL_BASED_OUTPATIENT_CLINIC_OR_DEPARTMENT_OTHER)
Admission: EM | Admit: 2019-03-13 | Discharge: 2019-03-14 | Disposition: A | Discharge status: Home / Self Care | Payer: Medicare Other | Attending: Emergency Medicine | Admitting: Emergency Medicine

## 2019-03-13 ENCOUNTER — Other Ambulatory Visit: Payer: Self-pay

## 2019-03-13 ENCOUNTER — Encounter (HOSPITAL_BASED_OUTPATIENT_CLINIC_OR_DEPARTMENT_OTHER): Payer: Self-pay | Admitting: *Deleted

## 2019-03-13 DIAGNOSIS — Z79899 Other long term (current) drug therapy: Secondary | ICD-10-CM | POA: Diagnosis not present

## 2019-03-13 DIAGNOSIS — E039 Hypothyroidism, unspecified: Secondary | ICD-10-CM | POA: Diagnosis not present

## 2019-03-13 DIAGNOSIS — F17210 Nicotine dependence, cigarettes, uncomplicated: Secondary | ICD-10-CM | POA: Insufficient documentation

## 2019-03-13 DIAGNOSIS — R1030 Lower abdominal pain, unspecified: Secondary | ICD-10-CM | POA: Diagnosis present

## 2019-03-13 DIAGNOSIS — J449 Chronic obstructive pulmonary disease, unspecified: Secondary | ICD-10-CM | POA: Insufficient documentation

## 2019-03-13 DIAGNOSIS — N3000 Acute cystitis without hematuria: Secondary | ICD-10-CM | POA: Diagnosis not present

## 2019-03-13 LAB — CBC
HCT: 40.8 % (ref 36.0–46.0)
Hemoglobin: 12.7 g/dL (ref 12.0–15.0)
MCH: 29.3 pg (ref 26.0–34.0)
MCHC: 31.1 g/dL (ref 30.0–36.0)
MCV: 94 fL (ref 80.0–100.0)
Platelets: 355 10*3/uL (ref 150–400)
RBC: 4.34 MIL/uL (ref 3.87–5.11)
RDW: 15.3 % (ref 11.5–15.5)
WBC: 8.7 10*3/uL (ref 4.0–10.5)
nRBC: 0 % (ref 0.0–0.2)

## 2019-03-13 LAB — URINALYSIS, ROUTINE W REFLEX MICROSCOPIC
Bilirubin Urine: NEGATIVE
Glucose, UA: NEGATIVE mg/dL
Ketones, ur: NEGATIVE mg/dL
Nitrite: NEGATIVE
Protein, ur: NEGATIVE mg/dL
Specific Gravity, Urine: 1.01 (ref 1.005–1.030)
pH: 6.5 (ref 5.0–8.0)

## 2019-03-13 LAB — URINALYSIS, MICROSCOPIC (REFLEX)

## 2019-03-13 LAB — COMPREHENSIVE METABOLIC PANEL
ALT: 14 U/L (ref 0–44)
AST: 16 U/L (ref 15–41)
Albumin: 3.2 g/dL — ABNORMAL LOW (ref 3.5–5.0)
Alkaline Phosphatase: 99 U/L (ref 38–126)
Anion gap: 10 (ref 5–15)
BUN: 13 mg/dL (ref 8–23)
CO2: 27 mmol/L (ref 22–32)
Calcium: 8.9 mg/dL (ref 8.9–10.3)
Chloride: 102 mmol/L (ref 98–111)
Creatinine, Ser: 0.9 mg/dL (ref 0.44–1.00)
GFR calc Af Amer: >60 mL/min (ref >60)
GFR calc non Af Amer: >60 mL/min (ref >60)
Glucose, Bld: 129 mg/dL — ABNORMAL HIGH (ref 70–99)
Potassium: 3.8 mmol/L (ref 3.5–5.1)
Sodium: 139 mmol/L (ref 135–145)
Total Bilirubin: 0.3 mg/dL (ref 0.3–1.2)
Total Protein: 6.8 g/dL (ref 6.5–8.1)

## 2019-03-13 LAB — LIPASE, BLOOD: Lipase: 31 U/L (ref 11–51)

## 2019-03-13 MED ORDER — ONDANSETRON 8 MG PO TBDP
8.0000 mg | ORAL_TABLET | Freq: Once | ORAL | Status: AC
  Administered 2019-03-13: 8 mg via ORAL
  Filled 2019-03-13: qty 1

## 2019-03-13 MED ORDER — LIDOCAINE HCL (PF) 1 % IJ SOLN
INTRAMUSCULAR | Status: AC
  Administered 2019-03-13: 5 mL
  Filled 2019-03-13: qty 5

## 2019-03-13 MED ORDER — ACETAMINOPHEN 500 MG PO TABS
1000.0000 mg | ORAL_TABLET | Freq: Once | ORAL | Status: AC
  Administered 2019-03-13: 1000 mg via ORAL
  Filled 2019-03-13: qty 2

## 2019-03-13 MED ORDER — CEFTRIAXONE SODIUM 1 G IJ SOLR
1.0000 g | Freq: Once | INTRAMUSCULAR | Status: AC
  Administered 2019-03-13: 1 g via INTRAMUSCULAR
  Filled 2019-03-13: qty 10

## 2019-03-13 NOTE — ED Triage Notes (Signed)
Abdominal pain, back pain and nausea today.

## 2019-03-13 NOTE — ED Notes (Signed)
Pt. Reports chronic back pain and yesterday she started having back pain with today starting into the L side from the L back and into the L bladder area.  Pt. Is able to urinate with no difficulty.  Pt. Reports last UTI was several yrs ago.  Pt. Reports nausea and pain.

## 2019-03-14 ENCOUNTER — Encounter (HOSPITAL_BASED_OUTPATIENT_CLINIC_OR_DEPARTMENT_OTHER): Payer: Self-pay | Admitting: Emergency Medicine

## 2019-03-14 DIAGNOSIS — N3000 Acute cystitis without hematuria: Secondary | ICD-10-CM | POA: Diagnosis not present

## 2019-03-14 MED ORDER — CEPHALEXIN 500 MG PO CAPS: 500.0000 mg | ORAL_CAPSULE | Freq: Four times a day (QID) | ORAL | 0 refills | Status: DC

## 2019-03-14 MED ORDER — KETOROLAC TROMETHAMINE 60 MG/2ML IM SOLN
30.0000 mg | Freq: Once | INTRAMUSCULAR | Status: AC
  Administered 2019-03-14: 30 mg via INTRAMUSCULAR
  Filled 2019-03-14: qty 2

## 2019-03-14 NOTE — ED Provider Notes (Signed)
[facility]ERGENCY DEPARTMENT Provider Note   CSN: [id number] Arrival date & time: 03/13/19  2148     History   Chief Complaint Chief Complaint  Patient presents with   Abdominal Pain    HPI Patricia Sutton is a 73 y.o. female.     The history is provided by the patient.  Abdominal Pain Pain location:  Suprapubic Pain quality: aching   Pain radiation: low back. Pain severity:  Severe Onset quality:  Gradual Duration:  1 day Timing:  Constant Progression:  Worsening Chronicity:  New Context: not recent illness   Relieved by:  Nothing Worsened by:  Nothing Ineffective treatments:  None tried Associated symptoms: constipation and nausea   Associated symptoms: no anorexia, no cough, no diarrhea, no dysuria, no flatus and no hematuria   Risk factors: no alcohol abuse   Patient with chronic pain and urinary urgency presents with lower abdominal pain radiating around the left side to the low back with associated nausea.  No f/c/r.  Has been constipated since Tuesday.    Past Medical History:  Diagnosis Date   Anxiety    Arthritis    Asthma    Back pain, chronic    Bone spur of other site    spine   Chronic back pain    spurs on spine   COPD (chronic obstructive pulmonary disease) ([place])    denies SOB   Depression    takes Effexor daily   GERD (gastroesophageal reflux disease)    takes Omeprazole daily   History of bronchitis    last time about 6+yrs ago   History of colon polyps    History of kidney stones    Hypothyroidism    takes Synthroid daily   Insomnia    takes Trazodone nightly and Ambien   Joint pain    Joint swelling    Nocturia    Pneumonia    hx of;last time about 6+yrs ago   PONV (postoperative nausea and vomiting)    Renal insufficiency    Sleep apnea    no CPAP; uses O2 at night 1l/min.   Urinary urgency     Patient Active Problem List   Diagnosis Date Noted   Tobacco use disorder 03/15/2015    UTI (lower urinary tract infection) 03/15/2015   COPD (chronic obstructive pulmonary disease) ([place]) 03/15/2015   Hypothyroidism 03/15/2015   Chronic pain syndrome 03/15/2015   Dehydration 03/15/2015   Elevated troponin 03/14/2015   Osteoarthritis of right knee 07/18/2013    Class: Chronic   Loose body of right knee 07/16/2012    Class: Chronic    Past Surgical History:  Procedure Laterality Date   ABDOMINAL HYSTERECTOMY     complete   bladder tacked     COLONOSCOPY     ESOPHAGOGASTRODUODENOSCOPY     KNEE ARTHROPLASTY Right 07/18/2013   Procedure: COMPUTER ASSISTED RIGHT TOTAL KNEE ARTHROPLASTY;  Surgeon: Jessy Oto, MD;  Location: [facility];  Service: Orthopedics;  Laterality: Right;   KNEE ARTHROSCOPY Right 07/16/2012   Procedure: ARTHROSCOPY KNEE;  Surgeon: Jessy Oto, MD;  Location: [facility];  Service: Orthopedics;  Laterality: Right;  Right knee arthroscopy with chondroplastic shaving of retropatella surface     OB History   No obstetric history on file.      Home Medications    Prior to Admission medications   Medication Sig Start Date End Date Taking? Authorizing Provider  acetaminophen-codeine (TYLENOL #3) 300-30 MG tablet Take 1  tablet by mouth 2 (two) times daily as needed for moderate pain. 05/31/18   Eldred Manges, MD  albuterol (PROVENTIL) (2.5 MG/3ML) 0.083% nebulizer solution Take 2.5 mg by nebulization every 4 (four) hours as needed for wheezing or shortness of breath.    [provider]  ALPRAZolam Prudy Feeler) 0.5 MG tablet Take right before MRI and may repeat x 1. 07/26/18   Kerrin Champagne, MD  amoxicillin-clavulanate (AUGMENTIN) 875-125 MG tablet Take 1 tablet by mouth every 12 (twelve) hours. 09/26/17   Petrucelli, Pleas Koch, PA-C  aspirin EC 81 MG tablet Take 1 tablet (81 mg total) by mouth daily. 03/16/15   Catarina Hartshorn, MD  azithromycin (ZITHROMAX) 250 MG tablet Take 1 tablet (250 mg total) by mouth daily. Take first 2 tablets  together, then 1 every day until finished. 09/26/17   Petrucelli, Samantha R, PA-C  budesonide-formoterol (SYMBICORT) 160-4.5 MCG/ACT inhaler Inhale 2 puffs into the lungs 2 (two) times daily.    [provider]  cephALEXin (KEFLEX) 500 MG capsule Take 1 capsule (500 mg total) by mouth 4 (four) times daily. 03/14/19   Emmy Keng, MD  diclofenac sodium (VOLTAREN) 1 % GEL APPLY 2-4 GRAMS TO THE AFFECTED AREA 2-3 TIMES DAILY 03/05/19   Kerrin Champagne, MD  levothyroxine (SYNTHROID, LEVOTHROID) 50 MCG tablet Take 1 tablet by mouth daily. 09/04/17   [provider]  lubiprostone (AMITIZA) 24 MCG capsule Take 1 tablet by mouth 2 (two) times daily.    [provider]  LYRICA 75 MG capsule Take 1 tablet by mouth 3 (three) times daily. 09/18/17   [provider]  meloxicam (MOBIC) 15 MG tablet TAKE 1 TABLET BY MOUTH EVERY MORNING WITH FOOD 06/22/17   Kerrin Champagne, MD  methocarbamol (ROBAXIN) 500 MG tablet Take 1 tablet (500 mg total) by mouth every 8 (eight) hours as needed for muscle spasms. 12/16/16   Molpus, John, MD  nabumetone (RELAFEN) 500 MG tablet TAKE 1 TABLET(500 MG) BY MOUTH TWICE DAILY 08/28/17   Kerrin Champagne, MD  omeprazole (PRILOSEC) 20 MG capsule Take 20 mg by mouth 2 (two) times daily.    [provider]  ondansetron (ZOFRAN ODT) 4 MG disintegrating tablet Take 1 tablet (4 mg total) by mouth every 8 (eight) hours as needed for nausea or vomiting. 09/26/17   Petrucelli, Samantha R, PA-C  Pregabalin (LYRICA PO) Take by mouth.    [provider]  promethazine (PHENERGAN) 25 MG tablet Take 25 mg by mouth 3 (three) times daily as needed for nausea or vomiting.    [provider]  traZODone (DESYREL) 100 MG tablet Take 100 mg by mouth at bedtime.     [provider]  VIIBRYD 20 MG TABS Take 1 tablet by mouth daily. 09/05/17   [provider]    Family History Family History  Problem Relation Age of Onset   Heart attack  Mother    Stroke Mother    Diabetes Mellitus II Mother    Lymphoma Father     Social History Social History   Tobacco Use   Smoking status: Current Some Day Smoker    Packs/day: 1.00    Years: 40.00    Pack years: 40.00    Types: Cigarettes   Smokeless tobacco: Never Used  Substance Use Topics   Alcohol use: No   Drug use: No     Allergies   Patient has no known allergies.   Review of Systems Review of Systems  Constitutional: Negative for appetite change and unexpected weight change.  HENT: Negative for congestion.   Eyes: Negative for visual disturbance.  Respiratory: Negative for cough.   Gastrointestinal: Positive for abdominal pain, constipation and nausea. Negative for anorexia, diarrhea and flatus.  Endocrine: Negative for polyuria.  Genitourinary: Negative for dysuria, frequency and hematuria.  Musculoskeletal: Negative for joint swelling.  Neurological: Negative for dizziness.  Psychiatric/Behavioral: Negative for agitation.  All other systems reviewed and are negative.    Physical Exam Updated Vital Signs BP (!) 123/55    Pulse (!) 58    Temp 98.6 F (37 C) (Oral)    Resp 18    Ht  (1.626 m)    Wt 53.5 kg    SpO2 94%    BMI 20.25 kg/m   Physical Exam Vitals signs and nursing note reviewed.  Constitutional:      General: She is not in acute distress.    Appearance: She is normal weight.  HENT:     Head: Normocephalic and atraumatic.     Nose: Nose normal.  Eyes:     Conjunctiva/sclera: Conjunctivae normal.     Pupils: Pupils are equal, round, and reactive to light.  Neck:     Musculoskeletal: Normal range of motion and neck supple.  Cardiovascular:     Rate and Rhythm: Normal rate and regular rhythm.     Pulses: Normal pulses.     Heart sounds: Normal heart sounds.  Pulmonary:     Effort: Pulmonary effort is normal.     Breath sounds: Normal breath sounds.  Abdominal:     General: Abdomen is flat. Bowel sounds are normal.      Tenderness: There is no abdominal tenderness. There is no guarding or rebound.  Musculoskeletal: Normal range of motion.  Skin:    General: Skin is warm and dry.     Capillary Refill: Capillary refill takes less than 2 seconds.  Neurological:     General: No focal deficit present.     Mental Status: She is alert and oriented to person, place, and time.     Deep Tendon Reflexes: Reflexes normal.  Psychiatric:        Mood and Affect: Mood normal.        Behavior: Behavior normal.      ED Treatments / Results  Labs (all labs ordered are listed, but only abnormal results are displayed) Results for orders placed or performed during the hospital encounter of 03/13/19  Lipase, blood  Result Value Ref Range   Lipase 31 11 - 51 U/L  Comprehensive metabolic panel  Result Value Ref Range   Sodium 139 135 - 145 mmol/L   Potassium 3.8 3.5 - 5.1 mmol/L   Chloride 102 98 - 111 mmol/L   CO2 27 22 - 32 mmol/L   Glucose, Bld 129 (H) 70 - 99 mg/dL   BUN 13 8 - 23 mg/dL   Creatinine, Ser 1.61 0.44 - 1.00 mg/dL   Calcium 8.9 8.9 - 09.6 mg/dL   Total Protein 6.8 6.5 - 8.1 g/dL   Albumin 3.2 (L) 3.5 - 5.0 g/dL   AST 16 15 - 41 U/L   ALT 14 0 - 44 U/L   Alkaline Phosphatase 99 38 - 126 U/L   Total Bilirubin 0.3 0.3 - 1.2 mg/dL   GFR calc non Af Amer >60 >60 mL/min   GFR calc Af Amer >60 >60 mL/min   Anion gap 10 5 - 15  CBC  Result  Value Ref Range   WBC 8.7 4.0 - 10.5 K/uL   RBC 4.34 3.87 - 5.11 MIL/uL   Hemoglobin 12.7 12.0 - 15.0 g/dL   HCT 16.140.8 09.636.0 - 04.546.0 %   MCV 94.0 80.0 - 100.0 fL   MCH 29.3 26.0 - 34.0 pg   MCHC 31.1 30.0 - 36.0 g/dL   RDW 40.915.3 81.111.5 - 91.415.5 %   Platelets 355 150 - 400 K/uL   nRBC 0.0 0.0 - 0.2 %  Urinalysis, Routine w reflex microscopic  Result Value Ref Range   Color, Urine YELLOW YELLOW   APPearance CLEAR CLEAR   Specific Gravity, Urine 1.010 1.005 - 1.030   pH 6.5 5.0 - 8.0   Glucose, UA NEGATIVE NEGATIVE mg/dL   Hgb urine dipstick TRACE (A) NEGATIVE    Bilirubin Urine NEGATIVE NEGATIVE   Ketones, ur NEGATIVE NEGATIVE mg/dL   Protein, ur NEGATIVE NEGATIVE mg/dL   Nitrite NEGATIVE NEGATIVE   Leukocytes,Ua LARGE (A) NEGATIVE  Urinalysis, Microscopic (reflex)  Result Value Ref Range   RBC / HPF 0-5 0 - 5 RBC/hpf   WBC, UA 11-20 0 - 5 WBC/hpf   Bacteria, UA MANY (A) NONE SEEN   Squamous Epithelial / LPF 6-10 0 - 5   WBC Clumps PRESENT    Ct Renal Stone Study  Result Date: 03/14/2019 CLINICAL DATA:  Chronic back pain, left abdomen and flank pain EXAM: CT ABDOMEN AND PELVIS WITHOUT CONTRAST TECHNIQUE: Multidetector CT imaging of the abdomen and pelvis was performed following the standard protocol without IV contrast. COMPARISON:  CT Sep 26, 2017 FINDINGS: Lower chest: Lung bases are clear. Normal heart size. No pericardial effusion. Atherosclerotic calcification of the coronary arteries. Hepatobiliary: No focal liver abnormality is seen. Patient is post cholecystectomy. Slight prominence of the biliary tree likely related to reservoir effect. No calcified intraductal gallstones. Pancreas: Unremarkable. No pancreatic ductal dilatation or surrounding inflammatory changes. Few splenic artery calcifications. Spleen: Normal in size without focal abnormality. Adrenals/Urinary Tract: Mild thickening of the adrenal glands, similar to prior. No suspicious adrenal lesions. Kidneys are normal, without renal calculi, focal lesion, or hydronephrosis. Mild circumferential thickening of the urinary bladder greater than expected for the degree of distention. Stomach/Bowel: Distal esophagus, stomach and duodenal sweep are unremarkable. No small bowel wall thickening or dilatation. Fecalization of the distal small bowel contents. No evidence of obstruction. A normal appendix is visualized. Moderate stool burden. No colonic dilatation or wall thickening. Portion of the hepatic flexure interposed anterior to the liver. Vascular/Lymphatic: Atherosclerotic plaque throughout the  aorta and branch vessels. Mild fusiform dilatation of the infrarenal abdominal aorta up to 2.4 cm. No suspicious or enlarged lymph nodes in the included lymphatic chains. Reproductive: Uterus is surgically absent. No concerning adnexal lesions. Other: No abdominopelvic free fluid or free gas. No bowel containing hernias. Musculoskeletal: Multilevel degenerative changes are present in the imaged portions of the spine. Additional degenerative changes in the hips. No acute osseous abnormality or suspicious osseous lesion. IMPRESSION: 1.  No urolithiasis or hydronephrosis. 2. Mild circumferential thickening of the urinary bladder greater than expected for the degree of distention. Correlate with urinalysis to exclude cystitis. 3. No other acute intra-abdominal process. 4. Post cholecystectomy, hysterectomy. 5. Aortic Atherosclerosis (ICD10-I70.0). 6. Ectatic abdominal aorta at risk for aneurysm development. Recommend followup by ultrasound in 5 years. This recommendation follows ACR consensus guidelines: White Paper of the ACR Incidental Findings Committee II on Vascular Findings. J Am Coll Radiol 2013; 10:789-794. Aortic aneurysm NOS (ICD10-I71.9) Electronically Signed  By: Kreg Shropshire M.D.   On: 03/14/2019 00:14    Radiology Ct Renal Stone Study  Result Date: 03/14/2019 CLINICAL DATA:  Chronic back pain, left abdomen and flank pain EXAM: CT ABDOMEN AND PELVIS WITHOUT CONTRAST TECHNIQUE: Multidetector CT imaging of the abdomen and pelvis was performed following the standard protocol without IV contrast. COMPARISON:  CT Sep 26, 2017 FINDINGS: Lower chest: Lung bases are clear. Normal heart size. No pericardial effusion. Atherosclerotic calcification of the coronary arteries. Hepatobiliary: No focal liver abnormality is seen. Patient is post cholecystectomy. Slight prominence of the biliary tree likely related to reservoir effect. No calcified intraductal gallstones. Pancreas: Unremarkable. No pancreatic ductal  dilatation or surrounding inflammatory changes. Few splenic artery calcifications. Spleen: Normal in size without focal abnormality. Adrenals/Urinary Tract: Mild thickening of the adrenal glands, similar to prior. No suspicious adrenal lesions. Kidneys are normal, without renal calculi, focal lesion, or hydronephrosis. Mild circumferential thickening of the urinary bladder greater than expected for the degree of distention. Stomach/Bowel: Distal esophagus, stomach and duodenal sweep are unremarkable. No small bowel wall thickening or dilatation. Fecalization of the distal small bowel contents. No evidence of obstruction. A normal appendix is visualized. Moderate stool burden. No colonic dilatation or wall thickening. Portion of the hepatic flexure interposed anterior to the liver. Vascular/Lymphatic: Atherosclerotic plaque throughout the aorta and branch vessels. Mild fusiform dilatation of the infrarenal abdominal aorta up to 2.4 cm. No suspicious or enlarged lymph nodes in the included lymphatic chains. Reproductive: Uterus is surgically absent. No concerning adnexal lesions. Other: No abdominopelvic free fluid or free gas. No bowel containing hernias. Musculoskeletal: Multilevel degenerative changes are present in the imaged portions of the spine. Additional degenerative changes in the hips. No acute osseous abnormality or suspicious osseous lesion. IMPRESSION: 1.  No urolithiasis or hydronephrosis. 2. Mild circumferential thickening of the urinary bladder greater than expected for the degree of distention. Correlate with urinalysis to exclude cystitis. 3. No other acute intra-abdominal process. 4. Post cholecystectomy, hysterectomy. 5. Aortic Atherosclerosis (ICD10-I70.0). 6. Ectatic abdominal aorta at risk for aneurysm development. Recommend followup by ultrasound in 5 years. This recommendation follows ACR consensus guidelines: White Paper of the ACR Incidental Findings Committee II on Vascular Findings. J Am  Coll Radiol 2013; 10:789-794. Aortic aneurysm NOS (ICD10-I71.9) Electronically Signed   By: Kreg Shropshire M.D.   On: 03/14/2019 00:14    Procedures Procedures (including critical care time)  Medications Ordered in ED Medications  ketorolac (TORADOL) injection 30 mg (has no administration in time range)  cefTRIAXone (ROCEPHIN) injection 1 g (1 g Intramuscular Given 03/13/19 2333)  lidocaine (PF) (XYLOCAINE) 1 % injection (5 mLs  Given 03/13/19 2334)  ondansetron (ZOFRAN-ODT) disintegrating tablet 8 mg (8 mg Oral Given 03/13/19 2332)  acetaminophen (TYLENOL) tablet 1,000 mg (1,000 mg Oral Given 03/13/19 2333)     Patient with UTI.  No stones.  I do not think narcotics are prudent in a patient with h/o respiratory issues and ongoing constipation.  Given IM rocephin for UTI.  Will start keflex.  Strict return precautions given.  Follow up with your PMD  Patricia Sutton was evaluated in Emergency Department on 03/14/2019 for the symptoms described in the history of present illness. She was evaluated in the context of the global COVID-19 pandemic, which necessitated consideration that the patient might be at risk for infection with the SARS-CoV-2 virus that causes COVID-19. Institutional protocols and algorithms that pertain to the evaluation of patients at risk for COVID-19 are in a state  of rapid change based on information released by regulatory bodies including the CDC and federal and state organizations. These policies and algorithms were followed during the patient's care in the ED.   Final Clinical Impressions(s) / ED Diagnoses   Final diagnoses:  Acute cystitis without hematuria   Return for weakness, numbness, changes in vision or speech, fevers >100.4 unrelieved by medication, shortness of breath, intractable vomiting, or diarrhea, abdominal pain, Inability to tolerate liquids or food, cough, altered mental status or any concerns. No signs of systemic illness or infection. The patient  is nontoxic-appearing on exam and vital signs are within normal limits.   I have reviewed the triage vital signs and the nursing notes. Pertinent labs &imaging results that were available during my care of the patient were reviewed by me and considered in my medical decision making (see chart for details).  After history, exam, and medical workup I feel the patient has been appropriately medically screened and is safe for discharge home. Pertinent diagnoses were discussed with the patient. Patient was given return precautions.  ED Discharge Orders         Ordered    cephALEXin (KEFLEX) 500 MG capsule  4 times daily     03/14/19 0030           Jaysen Wey, MD 03/14/19 [phone]

## 2019-03-14 NOTE — ED Notes (Signed)
Notified lab to add on urine culture 

## 2019-03-16 LAB — URINE CULTURE: Culture: >=100000 — AB

## 2019-03-17 ENCOUNTER — Other Ambulatory Visit (INDEPENDENT_AMBULATORY_CARE_PROVIDER_SITE_OTHER): Payer: Self-pay | Admitting: Specialist

## 2019-03-17 ENCOUNTER — Telehealth: Payer: Self-pay | Admitting: *Deleted

## 2019-03-17 NOTE — Telephone Encounter (Signed)
Post ED Visit - Positive Culture Follow-up  Culture report reviewed by antimicrobial stewardship pharmacist: [facility] Team []  Elenor Quinones, Pharm.D. []  Heide Guile, Pharm.D., BCPS AQ-ID []  Parks Neptune, Pharm.D., BCPS []  Alycia Rossetti, Pharm.D., BCPS []  [place], Pharm.D., BCPS, AAHIVP []  Legrand Como, Pharm.D., BCPS, AAHIVP []  Salome Arnt, PharmD, BCPS []  Johnnette Gourd, PharmD, BCPS [x]  Hughes Better, PharmD, BCPS []  Leeroy Cha, PharmD []  Laqueta Linden, PharmD, BCPS []  Albertina Parr, PharmD  [facility] Team []  Leodis Sias, PharmD []  Lindell Spar, PharmD []  Royetta Asal, PharmD []  Graylin Shiver, Rph []  Rema Fendt) Glennon Mac, PharmD []  Arlyn Dunning, PharmD []  Netta Cedars, PharmD []  Dia Sitter, PharmD []  Leone Haven, PharmD []  Gretta Arab, PharmD []  Theodis Shove, PharmD []  Peggyann Juba, PharmD []  Reuel Boom, PharmD   Positive urine culture Treated with Cephalexin, organism sensitive to the same and no further patient follow-up is required at this time.  Harlon Flor Talley 03/17/2019, 11:03 AM

## 2019-03-28 ENCOUNTER — Other Ambulatory Visit (INDEPENDENT_AMBULATORY_CARE_PROVIDER_SITE_OTHER): Payer: Self-pay | Admitting: Specialist

## 2019-04-09 ENCOUNTER — Other Ambulatory Visit (INDEPENDENT_AMBULATORY_CARE_PROVIDER_SITE_OTHER): Payer: Self-pay | Admitting: Specialist

## 2019-04-10 ENCOUNTER — Ambulatory Visit: Payer: Medicare Other | Admitting: Surgery

## 2019-04-23 ENCOUNTER — Other Ambulatory Visit (INDEPENDENT_AMBULATORY_CARE_PROVIDER_SITE_OTHER): Payer: Self-pay | Admitting: Specialist

## 2019-05-01 ENCOUNTER — Other Ambulatory Visit (INDEPENDENT_AMBULATORY_CARE_PROVIDER_SITE_OTHER): Payer: Self-pay | Admitting: Specialist

## 2019-05-06 ENCOUNTER — Telehealth: Payer: Self-pay | Admitting: Surgery

## 2019-05-06 NOTE — Telephone Encounter (Signed)
Called patient left message on voicemail to return call concerning scheduling an appointment with Jeneen Rinks per patient request for her back

## 2019-05-08 ENCOUNTER — Encounter: Payer: Self-pay | Admitting: Surgery

## 2019-05-08 ENCOUNTER — Ambulatory Visit: Payer: Self-pay

## 2019-05-08 ENCOUNTER — Ambulatory Visit (INDEPENDENT_AMBULATORY_CARE_PROVIDER_SITE_OTHER): Payer: Medicare Other | Admitting: Surgery

## 2019-05-08 ENCOUNTER — Other Ambulatory Visit: Payer: Self-pay

## 2019-05-08 VITALS — BP 160/69 | HR 61

## 2019-05-08 DIAGNOSIS — M4726 Other spondylosis with radiculopathy, lumbar region: Secondary | ICD-10-CM

## 2019-05-08 DIAGNOSIS — M533 Sacrococcygeal disorders, not elsewhere classified: Secondary | ICD-10-CM | POA: Diagnosis not present

## 2019-05-08 DIAGNOSIS — M5416 Radiculopathy, lumbar region: Secondary | ICD-10-CM | POA: Diagnosis not present

## 2019-05-08 DIAGNOSIS — G8929 Other chronic pain: Secondary | ICD-10-CM

## 2019-05-08 NOTE — Progress Notes (Signed)
Office Visit Note   Patient: Patricia Sutton           Date of Birth: 1945-07-25           MRN: [id number] Visit Date: 05/08/2019              Requested by: No referring provider defined for this encounter. PCP: Patient, No Pcp Per   Assessment & Plan: Visit Diagnoses:  1. Radiculopathy, lumbar region   2. Other spondylosis with radiculopathy, lumbar region   3. Chronic left SI joint pain     Plan: Since patient continues to have worsening low back pain and lower extremity radiculopathy that is failed conservative treatment with multiple lumbar injections I recommend repeating MRI lumbar spine and comparing to study that was done March 2020.  Follow-up with [name] after completion to discuss results and further treatment options there.  With the pain that she is also having around the left SI joint I think it is reasonable to try an injection there.  I will see about having her come in with Dr. Prince Rome tomorrow for an ultrasound-guided diagnostic/therapeutic Marcaine/Depo-Medrol left SI joint injection.  Follow-Up Instructions: Return in about 3 weeks (around 05/29/2019) for With [name] to review lumbar MRI.   Orders:  Orders Placed This Encounter  Procedures  . XR Lumbar Spine Complete W/Bend  . MR Lumbar Spine w/o contrast   No orders of the defined types were placed in this encounter.     Procedures: No procedures performed   Clinical Data: No additional findings.   Subjective: Chief Complaint  Patient presents with  . Lower Back - Follow-up    HPI 73 year old white Female with a history of lumbar spondylosis and lower extremity radiculopathy comes in with worsening low back pain.  She had lumbar MRI scan performed August 10, 2018 and that is documented in patient's chart.  She had lumbar radiofrequency ablation with Dr. Alvester Morin July 2020 and states that she did not have any improvement with that.  She complains of increased pain in the upper lumbar spine.  She  also complains of pain radiating down both legs to the ankles.  Increase symptoms when she is ambulating.  She also has a fair amount of pain centered around the left SI joint. Review of Systems No current cardiopulmonary GI GU issues  Objective: Vital Signs: BP (!) 160/69   Pulse 61   Physical Exam HENT:     Head: Normocephalic.  Eyes:     Pupils: Pupils are equal, round, and reactive to light.  Pulmonary:     Effort: No respiratory distress.  Musculoskeletal:     Comments: Patient has tenderness around the T12-L3 spinous processes.  Marked tenderness over the left SI joint.  Negative log about his.  Negative straight leg raise.  No focal motor deficits.  Neurological:     General: No focal deficit present.     Mental Status: She is alert and oriented to person, place, and time.     Ortho Exam  Specialty Comments:  No specialty comments available.  Imaging: No results found.   PMFS History: Patient Active Problem List   Diagnosis Date Noted  . Tobacco use disorder 03/15/2015  . UTI (lower urinary tract infection) 03/15/2015  . COPD (chronic obstructive pulmonary disease) (HCC) 03/15/2015  . Hypothyroidism 03/15/2015  . Chronic pain syndrome 03/15/2015  . Dehydration 03/15/2015  . Elevated troponin 03/14/2015  . Osteoarthritis of right knee 07/18/2013    Class: Chronic  .  Loose body of right knee 07/16/2012    Class: Chronic   Past Medical History:  Diagnosis Date  . Anxiety   . Arthritis   . Asthma   . Back pain, chronic   . Bone spur of other site    spine  . Chronic back pain    spurs on spine  . COPD (chronic obstructive pulmonary disease) ([place])    denies SOB  . Depression    takes Effexor daily  . GERD (gastroesophageal reflux disease)    takes Omeprazole daily  . History of bronchitis    last time about 6+yrs ago  . History of colon polyps   . History of kidney stones   . Hypothyroidism    takes Synthroid daily  . Insomnia    takes Trazodone  nightly and Ambien  . Joint pain   . Joint swelling   . Nocturia   . Pneumonia    hx of;last time about 6+yrs ago  . PONV (postoperative nausea and vomiting)   . Renal insufficiency   . Sleep apnea    no CPAP; uses O2 at night 1l/min.  Marland Kitchen Urinary urgency     Family History  Problem Relation Age of Onset  . Heart attack Mother   . Stroke Mother   . Diabetes Mellitus II Mother   . Lymphoma Father     Past Surgical History:  Procedure Laterality Date  . ABDOMINAL HYSTERECTOMY     complete  . bladder tacked    . COLONOSCOPY    . ESOPHAGOGASTRODUODENOSCOPY    . KNEE ARTHROPLASTY Right 07/18/2013   Procedure: COMPUTER ASSISTED RIGHT TOTAL KNEE ARTHROPLASTY;  Surgeon: Jessy Oto, MD;  Location: [facility];  Service: Orthopedics;  Laterality: Right;  . KNEE ARTHROSCOPY Right 07/16/2012   Procedure: ARTHROSCOPY KNEE;  Surgeon: Jessy Oto, MD;  Location: [facility];  Service: Orthopedics;  Laterality: Right;  Right knee arthroscopy with chondroplastic shaving of retropatella surface   Social History   Occupational History  . Not on file  Tobacco Use  . Smoking status: Current Some Day Smoker    Packs/day: 1.00    Years: 40.00    Pack years: 40.00    Types: Cigarettes  . Smokeless tobacco: Never Used  Substance and Sexual Activity  . Alcohol use: No  . Drug use: No  . Sexual activity: Not on file

## 2019-05-09 ENCOUNTER — Encounter: Payer: Self-pay | Admitting: Family Medicine

## 2019-05-09 ENCOUNTER — Other Ambulatory Visit: Payer: Self-pay

## 2019-05-09 ENCOUNTER — Ambulatory Visit: Payer: Self-pay

## 2019-05-09 ENCOUNTER — Ambulatory Visit (INDEPENDENT_AMBULATORY_CARE_PROVIDER_SITE_OTHER): Payer: Medicare Other | Admitting: Family Medicine

## 2019-05-09 DIAGNOSIS — M533 Sacrococcygeal disorders, not elsewhere classified: Secondary | ICD-10-CM

## 2019-05-09 DIAGNOSIS — G8929 Other chronic pain: Secondary | ICD-10-CM | POA: Diagnosis not present

## 2019-05-09 NOTE — Progress Notes (Signed)
Subjective: Patient is here for ultrasound-guided intra-articular left SI joint injection.   Pain on both sides, but especially the left.   Objective:  Tender directly over the mid portion of the left SI joint.  Procedure: Ultrasound-guided left SI injection: After sterile prep with Betadine, injected 5 cc 1% lidocaine without epinephrine and 40 mg methylprednisolone using a 22-gauge spinal needle, passing the needle through the sacroiliac ligament into the region of the SI joint.  Excellent immediate relief.  If this helps a lot, could do the right one later on if needed.

## 2019-05-12 ENCOUNTER — Telehealth: Payer: Self-pay | Admitting: Specialist

## 2019-05-12 ENCOUNTER — Other Ambulatory Visit (INDEPENDENT_AMBULATORY_CARE_PROVIDER_SITE_OTHER): Payer: Self-pay | Admitting: Specialist

## 2019-05-12 NOTE — Telephone Encounter (Signed)
Patient called and stated that she was only given 3 of the diclofenac Sodium tubes. She usually gets 5.  Please call patient to advise  [phone]

## 2019-05-12 NOTE — Telephone Encounter (Signed)
I called and spoke with patient and advised that Dr. Louanne Skye sent in for 350g of the gel, and that she will need to call them and clarify how much she has been given prior to that, as I am not sure how much comes in a tube.

## 2019-05-20 ENCOUNTER — Other Ambulatory Visit (INDEPENDENT_AMBULATORY_CARE_PROVIDER_SITE_OTHER): Payer: Self-pay | Admitting: Specialist

## 2019-05-24 ENCOUNTER — Ambulatory Visit
Admission: RE | Admit: 2019-05-24 | Discharge: 2019-05-24 | Disposition: A | Discharge status: Home / Self Care | Payer: Medicare Other | Source: Ambulatory Visit | Attending: Surgery | Admitting: Surgery

## 2019-05-24 ENCOUNTER — Other Ambulatory Visit: Payer: Self-pay

## 2019-05-24 DIAGNOSIS — M4726 Other spondylosis with radiculopathy, lumbar region: Secondary | ICD-10-CM

## 2019-05-24 DIAGNOSIS — M5416 Radiculopathy, lumbar region: Secondary | ICD-10-CM

## 2019-05-27 ENCOUNTER — Other Ambulatory Visit (INDEPENDENT_AMBULATORY_CARE_PROVIDER_SITE_OTHER): Payer: Self-pay | Admitting: Specialist

## 2019-05-29 ENCOUNTER — Encounter: Payer: Self-pay | Admitting: Family Medicine

## 2019-05-29 ENCOUNTER — Ambulatory Visit (INDEPENDENT_AMBULATORY_CARE_PROVIDER_SITE_OTHER): Payer: Medicare Other | Admitting: Family Medicine

## 2019-05-29 ENCOUNTER — Other Ambulatory Visit: Payer: Self-pay

## 2019-05-29 ENCOUNTER — Ambulatory Visit: Payer: Medicare Other | Admitting: Specialist

## 2019-05-29 ENCOUNTER — Ambulatory Visit: Payer: Self-pay

## 2019-05-29 DIAGNOSIS — G8929 Other chronic pain: Secondary | ICD-10-CM | POA: Diagnosis not present

## 2019-05-29 DIAGNOSIS — M533 Sacrococcygeal disorders, not elsewhere classified: Secondary | ICD-10-CM | POA: Diagnosis not present

## 2019-05-29 DIAGNOSIS — M5416 Radiculopathy, lumbar region: Secondary | ICD-10-CM

## 2019-05-29 NOTE — Progress Notes (Signed)
Office Visit Note   Patient: Patricia Sutton           Date of Birth: February 19, 1946           MRN: [id number] Visit Date: 05/29/2019 Requested by: No referring provider defined for this encounter. PCP: Patient, No Pcp Per  Subjective: Chief Complaint  Patient presents with  . Lower Back - Pain, Follow-up    Left SI joint injection worked well. Would like to have the right one injected today.    HPI: She is here with right-sided low back pain.  She did very well with left SI joint injection and would like to try one on the right.  She also was not able to make it to her appointment with [name] and asked me about her MRI results.  I told her that I would ask him to relay the results to her, but I did give her a copy of her report today.  She wants to know whether she will need fusion surgery.               ROS: No fever or chills.  All other systems were reviewed and are negative.  Objective: Vital Signs: There were no vitals taken for this visit.  Physical Exam:  General:  Alert and oriented, in no acute distress. Pulm:  Breathing unlabored. Psy:  Normal mood, congruent affect. Skin: No rash. Low back: She is tender along the right sacroiliac joint in the midline.  Imaging: None other than for needle guidance  Assessment & Plan: 1.  Chronic low back pain with right SI joint pain. -We will inject the right SI joint today.  I will ask [name] to relay recommendations to her regarding recent MRI results.     Procedures: Procedure: Ultrasound-guided right SI injection: After sterile prep with Betadine, injected 5 cc 1% lidocaine without epinephrine and 40 mg methylprednisolone using a 22-gauge spinal needle, passing the needle through the sacroiliac ligament into the region of the SI joint.  Patient had very good immediate relief.     PMFS History: Patient Active Problem List   Diagnosis Date Noted  . Centrilobular emphysema (HCC) 07/03/2018  . Acute right-sided low  back pain without sciatica 01/29/2018  . Anxiety 04/16/2017  . DDD (degenerative disc disease), lumbar 12/29/2016  . Tobacco use disorder 03/15/2015  . UTI (lower urinary tract infection) 03/15/2015  . COPD (chronic obstructive pulmonary disease) (HCC) 03/15/2015  . Hypothyroidism 03/15/2015  . Chronic pain syndrome 03/15/2015  . Dehydration 03/15/2015  . Elevated troponin 03/14/2015  . Osteoarthritis of right knee 07/18/2013    Class: Chronic  . Chronic hepatitis C (HCC) 11/02/2012  . Constipation 11/02/2012  . Loose body of right knee 07/16/2012    Class: Chronic   Past Medical History:  Diagnosis Date  . Anxiety   . Arthritis   . Asthma   . Back pain, chronic   . Bone spur of other site    spine  . Chronic back pain    spurs on spine  . COPD (chronic obstructive pulmonary disease) (HCC)    denies SOB  . Depression    takes Effexor daily  . GERD (gastroesophageal reflux disease)    takes Omeprazole daily  . History of bronchitis    last time about 6+yrs ago  . History of colon polyps   . History of kidney stones   . Hypothyroidism    takes Synthroid daily  . Insomnia    takes  Trazodone nightly and Ambien  . Joint pain   . Joint swelling   . Nocturia   . Pneumonia    hx of;last time about 6+yrs ago  . PONV (postoperative nausea and vomiting)   . Renal insufficiency   . Sleep apnea    no CPAP; uses O2 at night 1l/min.  Marland Kitchen Urinary urgency     Family History  Problem Relation Age of Onset  . Heart attack Mother   . Stroke Mother   . Diabetes Mellitus II Mother   . Lymphoma Father     Past Surgical History:  Procedure Laterality Date  . ABDOMINAL HYSTERECTOMY     complete  . bladder tacked    . COLONOSCOPY    . ESOPHAGOGASTRODUODENOSCOPY    . KNEE ARTHROPLASTY Right 07/18/2013   Procedure: COMPUTER ASSISTED RIGHT TOTAL KNEE ARTHROPLASTY;  Surgeon: Jessy Oto, MD;  Location: [facility];  Service: Orthopedics;  Laterality: Right;  . KNEE ARTHROSCOPY Right  07/16/2012   Procedure: ARTHROSCOPY KNEE;  Surgeon: Jessy Oto, MD;  Location: [facility];  Service: Orthopedics;  Laterality: Right;  Right knee arthroscopy with chondroplastic shaving of retropatella surface   Social History   Occupational History  . Not on file  Tobacco Use  . Smoking status: Current Some Day Smoker    Packs/day: 1.00    Years: 40.00    Pack years: 40.00    Types: Cigarettes  . Smokeless tobacco: Never Used  Substance and Sexual Activity  . Alcohol use: No  . Drug use: No  . Sexual activity: Not on file

## 2019-06-02 ENCOUNTER — Encounter: Payer: Self-pay | Admitting: Specialist

## 2019-06-02 ENCOUNTER — Telehealth: Payer: Self-pay | Admitting: Specialist

## 2019-06-02 NOTE — Telephone Encounter (Signed)
Patient called advised she usually get 5 tubes of the  Diclofenac and only 3 tubes the last time. Patient said she is completely out of the medication. The number to contact patient is [phone]

## 2019-06-04 ENCOUNTER — Other Ambulatory Visit: Payer: Self-pay | Admitting: Specialist

## 2019-06-04 MED ORDER — DICLOFENAC SODIUM 1 % EX GEL: CUTANEOUS | 6 refills | Status: DC

## 2019-06-04 NOTE — Telephone Encounter (Signed)
See other message

## 2019-06-06 ENCOUNTER — Other Ambulatory Visit (INDEPENDENT_AMBULATORY_CARE_PROVIDER_SITE_OTHER): Payer: Self-pay | Admitting: Specialist

## 2019-06-19 ENCOUNTER — Ambulatory Visit (INDEPENDENT_AMBULATORY_CARE_PROVIDER_SITE_OTHER): Payer: Medicare HMO | Admitting: Specialist

## 2019-06-19 ENCOUNTER — Other Ambulatory Visit: Payer: Self-pay

## 2019-06-19 ENCOUNTER — Encounter: Payer: Self-pay | Admitting: Specialist

## 2019-06-19 VITALS — BP 159/67 | HR 54 | Ht 63.0 in | Wt 124.0 lb

## 2019-06-19 DIAGNOSIS — M533 Sacrococcygeal disorders, not elsewhere classified: Secondary | ICD-10-CM | POA: Diagnosis not present

## 2019-06-19 DIAGNOSIS — M4807 Spinal stenosis, lumbosacral region: Secondary | ICD-10-CM | POA: Diagnosis not present

## 2019-06-19 DIAGNOSIS — M47816 Spondylosis without myelopathy or radiculopathy, lumbar region: Secondary | ICD-10-CM | POA: Diagnosis not present

## 2019-06-19 DIAGNOSIS — M4815 Ankylosing hyperostosis [Forestier], thoracolumbar region: Secondary | ICD-10-CM

## 2019-06-19 DIAGNOSIS — G8929 Other chronic pain: Secondary | ICD-10-CM

## 2019-06-19 MED ORDER — TRAMADOL HCL 50 MG PO TABS: ORAL_TABLET | ORAL | 0 refills | Status: DC

## 2019-06-19 MED ORDER — CELECOXIB 100 MG PO CAPS: 100.0000 mg | ORAL_CAPSULE | Freq: Two times a day (BID) | ORAL | 3 refills | Status: AC

## 2019-06-19 NOTE — Patient Instructions (Signed)
Avoid bending, stooping and avoid lifting weights greater than 10 lbs. Avoid prolong standing and walking. Avoid frequent bending and stooping  No lifting greater than 10 lbs. May use ice or moist heat for pain. Weight loss is of benefit. Handicap license is approved. Please call when pain recurrs and we with have Dr. Ramond Dial secretary/Assistant will call to arrange for epidural steroid injection Arthritis meds like motrin, advil, alleve or naprosyn can help with the pain associated with arthritis changes in your SI joints and  Lumbar disc and joints.

## 2019-06-19 NOTE — Progress Notes (Signed)
Office Visit Note   Patient: Patricia Sutton           Date of Birth: [birth year]           MRN: [id number] Visit Date: 06/19/2019              Requested by: No referring provider defined for this encounter. PCP: Patient, No Pcp Per   Assessment & Plan: Visit Diagnoses:  1. Chronic right SI joint pain   2. Chronic left SI joint pain   3. Forestier's disease of thoracolumbar region   4. Spondylosis without myelopathy or radiculopathy, lumbar region   5. Spinal stenosis of lumbosacral region     Plan: Avoid bending, stooping and avoid lifting weights greater than 10 lbs. Avoid prolong standing and walking. Avoid frequent bending and stooping  No lifting greater than 10 lbs. May use ice or moist heat for pain. Weight loss is of benefit. Handicap license is approved. Please call when pain recurrs and we with have Dr. Park Liter secretary/Assistant will call to arrange for epidural steroid injection Arthritis meds like motrin, advil, alleve or naprosyn can help with the pain associated with arthritis changes in your SI joints and  Lumbar disc and joints. Celebex for arthritis.  Follow-Up Instructions: Return in about 6 weeks (around 07/31/2019).   Orders:  No orders of the defined types were placed in this encounter.  No orders of the defined types were placed in this encounter.     Procedures: No procedures performed   Clinical Data: No additional findings.   Subjective: Chief Complaint  Patient presents with  . Lower Back - Follow-up    MRI Review    74 year old female with history of lumbar DDD and spondylosis and mechanical back and SI joint pain. She has had bilateral  SI joint injections and she reports these help but now the pain in the left side has moved upwards and now she is having pain in the left posterior mid lumbar area. No bowel or bladder difficulty. She takes tramadol 1 every 6 hours.   Review of Systems  Constitutional: Negative.  Negative for  activity change, appetite change, chills, diaphoresis, fatigue, fever and unexpected weight change.  HENT: Negative.  Negative for congestion, dental problem, drooling, ear discharge, ear pain, facial swelling, hearing loss, mouth sores and nosebleeds.   Eyes: Negative.  Negative for photophobia, pain, discharge, redness, itching and visual disturbance.  Respiratory: Positive for wheezing.   Cardiovascular: Negative.   Gastrointestinal: Negative.   Endocrine: Negative.   Genitourinary: Negative.   Musculoskeletal: Positive for back pain and gait problem.  Neurological: Negative for dizziness, tremors, seizures, syncope, facial asymmetry, speech difficulty, weakness, light-headedness, numbness and headaches.  Hematological: Negative.   Psychiatric/Behavioral: Negative.      Objective: Vital Signs: BP (!) 159/67 (BP Location: Left Arm, Patient Position: Sitting)   Pulse (!) 54   Ht 5\' 3"  (1.6 m)   Wt 124 lb (56.2 kg)   BMI 21.97 kg/m   Physical Exam Constitutional:      Appearance: She is well-developed.  HENT:     Head: Normocephalic and atraumatic.  Eyes:     Pupils: Pupils are equal, round, and reactive to light.  Pulmonary:     Effort: Pulmonary effort is normal.     Breath sounds: Normal breath sounds.  Abdominal:     General: Bowel sounds are normal.     Palpations: Abdomen is soft.  Musculoskeletal:     Cervical back:  Normal range of motion and neck supple.  Skin:    General: Skin is warm and dry.  Neurological:     Mental Status: She is alert and oriented to person, place, and time.  Psychiatric:        Behavior: Behavior normal.        Thought Content: Thought content normal.        Judgment: Judgment normal.     Back Exam   Range of Motion  Extension: abnormal  Lateral bend right: abnormal  Lateral bend left: abnormal  Rotation right: abnormal  Rotation left: abnormal   Muscle Strength  Right Quadriceps:  5/5  Left Quadriceps:  5/5  Right  Hamstrings:  5/5  Left Hamstrings:  5/5       Specialty Comments:  No specialty comments available.  Imaging: No results found.   PMFS History: Patient Active Problem List   Diagnosis Date Noted  . Osteoarthritis of right knee 07/18/2013    Priority: High    Class: Chronic  . Loose body of right knee 07/16/2012    Priority: High    Class: Chronic  . Centrilobular emphysema (HCC) 07/03/2018  . Acute right-sided low back pain without sciatica 01/29/2018  . Anxiety 04/16/2017  . DDD (degenerative disc disease), lumbar 12/29/2016  . Tobacco use disorder 03/15/2015  . UTI (lower urinary tract infection) 03/15/2015  . COPD (chronic obstructive pulmonary disease) (HCC) 03/15/2015  . Hypothyroidism 03/15/2015  . Chronic pain syndrome 03/15/2015  . Dehydration 03/15/2015  . Elevated troponin 03/14/2015  . Chronic hepatitis C (HCC) 11/02/2012  . Constipation 11/02/2012   Past Medical History:  Diagnosis Date  . Anxiety   . Arthritis   . Asthma   . Back pain, chronic   . Bone spur of other site    spine  . Chronic back pain    spurs on spine  . COPD (chronic obstructive pulmonary disease) (HCC)    denies SOB  . Depression    takes Effexor daily  . GERD (gastroesophageal reflux disease)    takes Omeprazole daily  . History of bronchitis    last time about 6+yrs ago  . History of colon polyps   . History of kidney stones   . Hypothyroidism    takes Synthroid daily  . Insomnia    takes Trazodone nightly and Ambien  . Joint pain   . Joint swelling   . Nocturia   . Pneumonia    hx of;last time about 6+yrs ago  . PONV (postoperative nausea and vomiting)   . Renal insufficiency   . Sleep apnea    no CPAP; uses O2 at night 1l/min.  Marland Kitchen Urinary urgency     Family History  Problem Relation Age of Onset  . Heart attack Mother   . Stroke Mother   . Diabetes Mellitus II Mother   . Lymphoma Father     Past Surgical History:  Procedure Laterality Date  . ABDOMINAL  HYSTERECTOMY     complete  . bladder tacked    . COLONOSCOPY    . ESOPHAGOGASTRODUODENOSCOPY    . KNEE ARTHROPLASTY Right 07/18/2013   Procedure: COMPUTER ASSISTED RIGHT TOTAL KNEE ARTHROPLASTY;  Surgeon: Kerrin Champagne, MD;  Location: [facility];  Service: Orthopedics;  Laterality: Right;  . KNEE ARTHROSCOPY Right 07/16/2012   Procedure: ARTHROSCOPY KNEE;  Surgeon: Kerrin Champagne, MD;  Location: [facility];  Service: Orthopedics;  Laterality: Right;  Right knee arthroscopy with chondroplastic shaving of retropatella  surface   Social History   Occupational History  . Not on file  Tobacco Use  . Smoking status: Current Some Day Smoker    Packs/day: 1.00    Years: 40.00    Pack years: 40.00    Types: Cigarettes  . Smokeless tobacco: Never Used  Substance and Sexual Activity  . Alcohol use: No  . Drug use: No  . Sexual activity: Not on file

## 2019-06-26 ENCOUNTER — Other Ambulatory Visit: Payer: Self-pay | Admitting: Specialist

## 2019-07-03 ENCOUNTER — Other Ambulatory Visit: Payer: Self-pay | Admitting: Specialist

## 2019-07-08 ENCOUNTER — Ambulatory Visit: Payer: Self-pay

## 2019-07-08 ENCOUNTER — Encounter: Payer: Self-pay | Admitting: Family Medicine

## 2019-07-08 ENCOUNTER — Other Ambulatory Visit: Payer: Self-pay

## 2019-07-08 ENCOUNTER — Ambulatory Visit (INDEPENDENT_AMBULATORY_CARE_PROVIDER_SITE_OTHER): Payer: Medicare HMO | Admitting: Family Medicine

## 2019-07-08 DIAGNOSIS — M533 Sacrococcygeal disorders, not elsewhere classified: Secondary | ICD-10-CM | POA: Diagnosis not present

## 2019-07-08 DIAGNOSIS — G8929 Other chronic pain: Secondary | ICD-10-CM | POA: Diagnosis not present

## 2019-07-08 NOTE — Progress Notes (Signed)
Office Visit Note   Patient: Patricia Sutton           Date of Birth: September 24, 1945           MRN: [id number] Visit Date: 07/08/2019 Requested by: No referring provider defined for this encounter. PCP: Patient, No Pcp Per  Subjective: Chief Complaint  Patient presents with  . Lower Back - Pain    Pain across lower back, with pain down the right leg.The SI joint injection helped her. The pain started again 1 week ago.    HPI: She is here with recurrent bilateral low back pain.  We have injected both of her SI joints with temporary improvement.  She would like to try them again.  Right side hurts worse on the left.              ROS:   All other systems were reviewed and are negative.  Objective: Vital Signs: There were no vitals taken for this visit.  Physical Exam:  General:  Alert and oriented, in no acute distress. Pulm:  Breathing unlabored. Psy:  Normal mood, congruent affect.  Low back: She has tenderness to palpation of both SI joints.    Assessment & Plan: 1.  Chronic low back pain with bilateral SI joint dysfunction -Bilateral steroid injections again today.  If she gets only short-term relief, we will try dextrose prolotherapy.     Procedures: Ultrasound-guided bilateral SI injection: After sterile prep with Betadine, injected 5 cc 1% lidocaine without epinephrine and 40 mg methylprednisolone using a 22-gauge spinal needle, passing the needle through the sacroiliac ligament into the region of the SI joint.  Good immediate relief.       PMFS History: Patient Active Problem List   Diagnosis Date Noted  . Centrilobular emphysema ([place]) 07/03/2018  . Acute right-sided low back pain without sciatica 01/29/2018  . Anxiety 04/16/2017  . DDD (degenerative disc disease), lumbar 12/29/2016  . Tobacco use disorder 03/15/2015  . UTI (lower urinary tract infection) 03/15/2015  . COPD (chronic obstructive pulmonary disease) ([place]) 03/15/2015  . Hypothyroidism 03/15/2015    . Chronic pain syndrome 03/15/2015  . Dehydration 03/15/2015  . Elevated troponin 03/14/2015  . Osteoarthritis of right knee 07/18/2013    Class: Chronic  . Chronic hepatitis C ([place]) 11/02/2012  . Constipation 11/02/2012  . Loose body of right knee 07/16/2012    Class: Chronic   Past Medical History:  Diagnosis Date  . Anxiety   . Arthritis   . Asthma   . Back pain, chronic   . Bone spur of other site    spine  . Chronic back pain    spurs on spine  . COPD (chronic obstructive pulmonary disease) ([place])    denies SOB  . Depression    takes Effexor daily  . GERD (gastroesophageal reflux disease)    takes Omeprazole daily  . History of bronchitis    last time about 6+yrs ago  . History of colon polyps   . History of kidney stones   . Hypothyroidism    takes Synthroid daily  . Insomnia    takes Trazodone nightly and Ambien  . Joint pain   . Joint swelling   . Nocturia   . Pneumonia    hx of;last time about 6+yrs ago  . PONV (postoperative nausea and vomiting)   . Renal insufficiency   . Sleep apnea    no CPAP; uses O2 at night 1l/min.  Marland Kitchen Urinary urgency  Family History  Problem Relation Age of Onset  . Heart attack Mother   . Stroke Mother   . Diabetes Mellitus II Mother   . Lymphoma Father     Past Surgical History:  Procedure Laterality Date  . ABDOMINAL HYSTERECTOMY     complete  . bladder tacked    . COLONOSCOPY    . ESOPHAGOGASTRODUODENOSCOPY    . KNEE ARTHROPLASTY Right 07/18/2013   Procedure: COMPUTER ASSISTED RIGHT TOTAL KNEE ARTHROPLASTY;  Surgeon: Kerrin Champagne, MD;  Location: [facility];  Service: Orthopedics;  Laterality: Right;  . KNEE ARTHROSCOPY Right 07/16/2012   Procedure: ARTHROSCOPY KNEE;  Surgeon: Kerrin Champagne, MD;  Location: [facility];  Service: Orthopedics;  Laterality: Right;  Right knee arthroscopy with chondroplastic shaving of retropatella surface   Social History   Occupational History  . Not on file  Tobacco Use   . Smoking status: Current Some Day Smoker    Packs/day: 1.00    Years: 40.00    Pack years: 40.00    Types: Cigarettes  . Smokeless tobacco: Never Used  Substance and Sexual Activity  . Alcohol use: No  . Drug use: No  . Sexual activity: Not on file

## 2019-07-09 ENCOUNTER — Other Ambulatory Visit: Payer: Self-pay | Admitting: Specialist

## 2019-07-23 ENCOUNTER — Other Ambulatory Visit: Payer: Self-pay | Admitting: Specialist

## 2019-07-31 ENCOUNTER — Ambulatory Visit: Payer: Medicare HMO | Admitting: Specialist

## 2019-08-04 ENCOUNTER — Other Ambulatory Visit: Payer: Self-pay | Admitting: Specialist

## 2019-08-12 ENCOUNTER — Other Ambulatory Visit: Payer: Self-pay | Admitting: Specialist

## 2019-08-22 ENCOUNTER — Ambulatory Visit: Payer: Medicare HMO | Admitting: Family Medicine

## 2019-08-22 ENCOUNTER — Ambulatory Visit: Payer: Medicare HMO | Admitting: Specialist

## 2019-08-25 ENCOUNTER — Encounter: Payer: Self-pay | Admitting: Family Medicine

## 2019-08-25 ENCOUNTER — Ambulatory Visit: Payer: Self-pay

## 2019-08-25 ENCOUNTER — Ambulatory Visit (INDEPENDENT_AMBULATORY_CARE_PROVIDER_SITE_OTHER): Payer: Medicare HMO | Admitting: Family Medicine

## 2019-08-25 ENCOUNTER — Other Ambulatory Visit: Payer: Self-pay | Admitting: Specialist

## 2019-08-25 ENCOUNTER — Other Ambulatory Visit: Payer: Self-pay

## 2019-08-25 DIAGNOSIS — M545 Low back pain: Secondary | ICD-10-CM

## 2019-08-25 DIAGNOSIS — M533 Sacrococcygeal disorders, not elsewhere classified: Secondary | ICD-10-CM

## 2019-08-25 DIAGNOSIS — G8929 Other chronic pain: Secondary | ICD-10-CM

## 2019-08-25 NOTE — Progress Notes (Signed)
Office Visit Note   Patient: Patricia Sutton           Date of Birth: 1945/08/11           MRN: [id number] Visit Date: 08/25/2019 Requested by: No referring provider defined for this encounter. PCP: Patient, No Pcp Per  Subjective: Chief Complaint  Patient presents with  . Lower Back - Pain    Chronic pain lower back and legs. Bilateral SI joint cortisone injections 07/08/19 only lasted a few days this time.    HPI: She is here with recurrent right greater than left low back pain.  In February I injected both of her SI joints.  This time she only got short-term relief.  The right side is always worse than the left, and it radiates down the back of her leg to her foot.  1 time when I did the injection she felt immediate pain shooting down her leg and that injection lasted longer than any of the others.  She has had radiofrequency grossly neurotomy of the facet joints last July and that gave her quite a bit of relief from her axial pain.  She would like to do that again if possible.  She will be visiting her 72 year old sister [place] at the end of next month.              ROS:   All other systems were reviewed and are negative.  Objective: Vital Signs: There were no vitals taken for this visit.  Physical Exam:  General:  Alert and oriented, in no acute distress. Pulm:  Breathing unlabored. Psy:  Normal mood, congruent affect.  Low back: Both of her SI joints are tender to palpation.  Negative straight leg raise, lower extremity strength and reflexes are normal.  Imaging: None other than for needle guidance  Assessment & Plan: 1.  Right greater than left low back pain with SI joint dysfunction -We will inject the right SI joint today.  Referral to Dr. Alvester Morin for consideration of radiofrequency ablation for the facet joints and possibly the SI joints.     Procedures: Ultrasound-guided right SI injection: After sterile prep with Betadine, injected 5 cc 1% lidocaine without  epinephrine and 40 mg methylprednisolone using a 22-gauge spinal needle, passing the needle through the sacroiliac ligament into the region of the SI joint.        PMFS History: Patient Active Problem List   Diagnosis Date Noted  . Centrilobular emphysema (HCC) 07/03/2018  . Acute right-sided low back pain without sciatica 01/29/2018  . Anxiety 04/16/2017  . DDD (degenerative disc disease), lumbar 12/29/2016  . Tobacco use disorder 03/15/2015  . UTI (lower urinary tract infection) 03/15/2015  . COPD (chronic obstructive pulmonary disease) (HCC) 03/15/2015  . Hypothyroidism 03/15/2015  . Chronic pain syndrome 03/15/2015  . Dehydration 03/15/2015  . Elevated troponin 03/14/2015  . Osteoarthritis of right knee 07/18/2013    Class: Chronic  . Chronic hepatitis C (HCC) 11/02/2012  . Constipation 11/02/2012  . Loose body of right knee 07/16/2012    Class: Chronic   Past Medical History:  Diagnosis Date  . Anxiety   . Arthritis   . Asthma   . Back pain, chronic   . Bone spur of other site    spine  . Chronic back pain    spurs on spine  . COPD (chronic obstructive pulmonary disease) (HCC)    denies SOB  . Depression    takes Effexor daily  .  GERD (gastroesophageal reflux disease)    takes Omeprazole daily  . History of bronchitis    last time about 6+yrs ago  . History of colon polyps   . History of kidney stones   . Hypothyroidism    takes Synthroid daily  . Insomnia    takes Trazodone nightly and Ambien  . Joint pain   . Joint swelling   . Nocturia   . Pneumonia    hx of;last time about 6+yrs ago  . PONV (postoperative nausea and vomiting)   . Renal insufficiency   . Sleep apnea    no CPAP; uses O2 at night 1l/min.  Marland Kitchen Urinary urgency     Family History  Problem Relation Age of Onset  . Heart attack Mother   . Stroke Mother   . Diabetes Mellitus II Mother   . Lymphoma Father     Past Surgical History:  Procedure Laterality Date  . ABDOMINAL HYSTERECTOMY      complete  . bladder tacked    . COLONOSCOPY    . ESOPHAGOGASTRODUODENOSCOPY    . KNEE ARTHROPLASTY Right 07/18/2013   Procedure: COMPUTER ASSISTED RIGHT TOTAL KNEE ARTHROPLASTY;  Surgeon: Jessy Oto, MD;  Location: [facility];  Service: Orthopedics;  Laterality: Right;  . KNEE ARTHROSCOPY Right 07/16/2012   Procedure: ARTHROSCOPY KNEE;  Surgeon: Jessy Oto, MD;  Location: [facility];  Service: Orthopedics;  Laterality: Right;  Right knee arthroscopy with chondroplastic shaving of retropatella surface   Social History   Occupational History  . Not on file  Tobacco Use  . Smoking status: Current Some Day Smoker    Packs/day: 1.00    Years: 40.00    Pack years: 40.00    Types: Cigarettes  . Smokeless tobacco: Never Used  Substance and Sexual Activity  . Alcohol use: No  . Drug use: No  . Sexual activity: Not on file

## 2019-09-15 ENCOUNTER — Encounter: Payer: Self-pay | Admitting: Specialist

## 2019-09-15 ENCOUNTER — Ambulatory Visit (INDEPENDENT_AMBULATORY_CARE_PROVIDER_SITE_OTHER): Payer: Medicare HMO | Admitting: Specialist

## 2019-09-15 ENCOUNTER — Other Ambulatory Visit: Payer: Self-pay

## 2019-09-15 VITALS — BP 156/71 | HR 63 | Ht 63.0 in | Wt 123.0 lb

## 2019-09-15 DIAGNOSIS — M4726 Other spondylosis with radiculopathy, lumbar region: Secondary | ICD-10-CM

## 2019-09-15 DIAGNOSIS — M4807 Spinal stenosis, lumbosacral region: Secondary | ICD-10-CM

## 2019-09-15 DIAGNOSIS — M533 Sacrococcygeal disorders, not elsewhere classified: Secondary | ICD-10-CM | POA: Diagnosis not present

## 2019-09-15 DIAGNOSIS — Z96651 Presence of right artificial knee joint: Secondary | ICD-10-CM

## 2019-09-15 DIAGNOSIS — M47816 Spondylosis without myelopathy or radiculopathy, lumbar region: Secondary | ICD-10-CM

## 2019-09-15 DIAGNOSIS — T8484XD Pain due to internal orthopedic prosthetic devices, implants and grafts, subsequent encounter: Secondary | ICD-10-CM

## 2019-09-15 DIAGNOSIS — M4815 Ankylosing hyperostosis [Forestier], thoracolumbar region: Secondary | ICD-10-CM | POA: Diagnosis not present

## 2019-09-15 DIAGNOSIS — M1712 Unilateral primary osteoarthritis, left knee: Secondary | ICD-10-CM

## 2019-09-15 DIAGNOSIS — G8929 Other chronic pain: Secondary | ICD-10-CM

## 2019-09-15 DIAGNOSIS — M5442 Lumbago with sciatica, left side: Secondary | ICD-10-CM

## 2019-09-15 MED ORDER — TRAMADOL HCL 50 MG PO TABS: 50.0000 mg | ORAL_TABLET | Freq: Four times a day (QID) | ORAL | 0 refills | Status: DC | PRN

## 2019-09-15 MED ORDER — DICLOFENAC SODIUM 1 % EX GEL: CUTANEOUS | 6 refills | Status: DC

## 2019-09-15 NOTE — Progress Notes (Signed)
Office Visit Note   Patient: Patricia Sutton           Date of Birth: March 01, 1946           MRN: [id number] Visit Date: 09/15/2019              Requested by: No referring provider defined for this encounter. PCP: Patient, No Pcp Per   Assessment & Plan: Visit Diagnoses:  1. Forestier's disease of thoracolumbar region   2. Chronic right SI joint pain   3. Other spondylosis with radiculopathy, lumbar region   4. Spinal stenosis of lumbosacral region   5. 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 Unilateral primary osteoarthritis, left knee   7. Spondylosis without myelopathy or radiculopathy, lumbar region   8. Chronic bilateral low back pain with left-sided sciatica     Plan: Avoid bending, stooping and avoid lifting weights greater than 10 lbs. Avoid prolong standing and walking. Avoid frequent bending and stooping  No lifting greater than 10 lbs. May use ice or moist heat for pain. Weight loss is of benefit. Handicap license is approved. Dr. Keeler Blas secretary/Assistant will call to arrange for right SI joint radiofrequency ablation Dr. [place] Blas secretary/Assistant will call to arrange for epidural steroid injection  Follow-Up Instructions: No follow-ups on file.   Orders:  No orders of the defined types were placed in this encounter.  No orders of the defined types were placed in this encounter.     Procedures: No procedures performed   Clinical Data: No additional findings.   Subjective: Chief Complaint  Patient presents with  . Lower Back - Follow-up    Had right SI joint injection on 08/25/2019--states that she has got a lot of relief.    74 year old female with history of right lumbar and right iliac crest pain. The pain is into the right iliac crest laterally on the right side. It is worse with prolong sitting, bending and stooping. No bowel or bladder difficulty. She has pain into both knees. Lateral left knee and medial right  knee. She is post right TKR in the past but performed Pes injections of both knees in the past. She has pain at night when she is trying to sleep awakens with pain. Her standing and walking is limited by the discomfort. She sometimes feels week in the legs if the pain has been present for 2-3 days. She has had recent SI injection by Dr. Alvester Morin with good pain relief on the right sided SI injection and is considering RFA of the right SI joint.    Review of Systems  Constitutional: Negative.  Negative for activity change, appetite change, chills, diaphoresis, fatigue, fever and unexpected weight change.  HENT: Negative.  Negative for congestion, dental problem, drooling, ear discharge, ear pain, facial swelling, hearing loss, mouth sores, nosebleeds, postnasal drip, rhinorrhea, sinus pressure, sinus pain, sneezing, sore throat, tinnitus and trouble swallowing.   Eyes: Negative.  Negative for photophobia, pain, discharge, redness, itching and visual disturbance.  Respiratory: Negative.  Negative for apnea, cough, choking, chest tightness, shortness of breath, wheezing and stridor.   Cardiovascular: Negative.  Negative for chest pain, palpitations and leg swelling.  Gastrointestinal: Negative.  Negative for abdominal distention, abdominal pain, anal bleeding, blood in stool, constipation, diarrhea, nausea, rectal pain and vomiting.  Genitourinary: Negative for difficulty urinating, dyspareunia, dysuria, enuresis and urgency.  Musculoskeletal: Positive for back pain, neck pain and neck stiffness. Negative for arthralgias, gait problem and joint  swelling.  Skin: Negative.   Allergic/Immunologic: Negative.   Neurological: Positive for weakness and numbness. Negative for dizziness, tremors, seizures, syncope, facial asymmetry, speech difficulty, light-headedness and headaches.  Hematological: Negative for adenopathy. Does not bruise/bleed easily.  Psychiatric/Behavioral: Negative for agitation, behavioral  problems, confusion, decreased concentration, dysphoric mood, hallucinations, self-injury, sleep disturbance and suicidal ideas. The patient is not nervous/anxious and is not hyperactive.      Objective: Vital Signs: BP (!) 156/71 (BP Location: Left Arm, Patient Position: Sitting)   Pulse 63   Ht 5\' 3"  (1.6 m)   Wt 123 lb (55.8 kg)   BMI 21.79 kg/m   Physical Exam  Ortho Exam  Specialty Comments:  No specialty comments available.  Imaging: No results found.   PMFS History: Patient Active Problem List   Diagnosis Date Noted  . Osteoarthritis of right knee 07/18/2013    Priority: High    Class: Chronic  . Loose body of right knee 07/16/2012    Priority: High    Class: Chronic  . Centrilobular emphysema ([place]) 07/03/2018  . Acute right-sided low back pain without sciatica 01/29/2018  . Anxiety 04/16/2017  . DDD (degenerative disc disease), lumbar 12/29/2016  . Tobacco use disorder 03/15/2015  . UTI (lower urinary tract infection) 03/15/2015  . COPD (chronic obstructive pulmonary disease) ([place]) 03/15/2015  . Hypothyroidism 03/15/2015  . Chronic pain syndrome 03/15/2015  . Dehydration 03/15/2015  . Elevated troponin 03/14/2015  . Chronic hepatitis C ([place]) 11/02/2012  . Constipation 11/02/2012   Past Medical History:  Diagnosis Date  . Anxiety   . Arthritis   . Asthma   . Back pain, chronic   . Bone spur of other site    spine  . Chronic back pain    spurs on spine  . COPD (chronic obstructive pulmonary disease) ([place])    denies SOB  . Depression    takes Effexor daily  . GERD (gastroesophageal reflux disease)    takes Omeprazole daily  . History of bronchitis    last time about 6+yrs ago  . History of colon polyps   . History of kidney stones   . Hypothyroidism    takes Synthroid daily  . Insomnia    takes Trazodone nightly and Ambien  . Joint pain   . Joint swelling   . Nocturia   . Pneumonia    hx of;last time about 6+yrs ago  . PONV  (postoperative nausea and vomiting)   . Renal insufficiency   . Sleep apnea    no CPAP; uses O2 at night 1l/min.  Marland Kitchen Urinary urgency     Family History  Problem Relation Age of Onset  . Heart attack Mother   . Stroke Mother   . Diabetes Mellitus II Mother   . Lymphoma Father     Past Surgical History:  Procedure Laterality Date  . ABDOMINAL HYSTERECTOMY     complete  . bladder tacked    . COLONOSCOPY    . ESOPHAGOGASTRODUODENOSCOPY    . KNEE ARTHROPLASTY Right 07/18/2013   Procedure: COMPUTER ASSISTED RIGHT TOTAL KNEE ARTHROPLASTY;  Surgeon: Jessy Oto, MD;  Location: [facility];  Service: Orthopedics;  Laterality: Right;  . KNEE ARTHROSCOPY Right 07/16/2012   Procedure: ARTHROSCOPY KNEE;  Surgeon: Jessy Oto, MD;  Location: [facility];  Service: Orthopedics;  Laterality: Right;  Right knee arthroscopy with chondroplastic shaving of retropatella surface   Social History   Occupational History  . Not on file  Tobacco Use  .  Smoking status: Current Some Day Smoker    Packs/day: 1.00    Years: 40.00    Pack years: 40.00    Types: Cigarettes  . Smokeless tobacco: Never Used  Substance and Sexual Activity  . Alcohol use: No  . Drug use: No  . Sexual activity: Not on file

## 2019-09-15 NOTE — Addendum Note (Signed)
Addended by: Vira Browns on: 09/15/2019 05:56 PM   Modules accepted: Orders

## 2019-09-15 NOTE — Patient Instructions (Addendum)
Avoid bending, stooping and avoid lifting weights greater than 10 lbs. Avoid prolong standing and walking. Avoid frequent bending and stooping  No lifting greater than 10 lbs. May use ice or moist heat for pain. Weight loss is of benefit. Handicap license is approved. Dr. [place] Blas secretary/Assistant will call to arrange for right SI joint radiofrequency ablation Dr. [place] Blas secretary/Assistant will call to arrange for epidural steroid injection

## 2019-10-06 ENCOUNTER — Other Ambulatory Visit: Payer: Self-pay

## 2019-10-06 ENCOUNTER — Ambulatory Visit: Payer: Self-pay

## 2019-10-06 ENCOUNTER — Ambulatory Visit (INDEPENDENT_AMBULATORY_CARE_PROVIDER_SITE_OTHER): Payer: Medicare HMO | Admitting: Physical Medicine and Rehabilitation

## 2019-10-06 ENCOUNTER — Encounter: Payer: Self-pay | Admitting: Physical Medicine and Rehabilitation

## 2019-10-06 VITALS — BP 139/71 | HR 61

## 2019-10-06 DIAGNOSIS — M461 Sacroiliitis, not elsewhere classified: Secondary | ICD-10-CM

## 2019-10-06 MED ORDER — METHYLPREDNISOLONE ACETATE 80 MG/ML IJ SUSP
40.0000 mg | Freq: Once | INTRAMUSCULAR | Status: AC
  Administered 2019-10-06: 40 mg

## 2019-10-06 NOTE — Progress Notes (Signed)
Pt states pain across the lower back and into the buttocks on the right side. Pt states pain started 2 years ago. Pt states sitting and bending makes pain worse. Nothing seems to help with pain.   .Numeric Pain Rating Scale and Functional Assessment Average Pain 8   In the last MONTH (on 0-10 scale) has pain interfered with the following?  1. General activity like being  able to carry out your everyday physical activities such as walking, climbing stairs, carrying groceries, or moving a chair?  Rating(8)   +Driver, -BT, -Dye Allergies.

## 2019-10-07 ENCOUNTER — Other Ambulatory Visit: Payer: Self-pay | Admitting: Specialist

## 2019-10-07 NOTE — Procedures (Signed)
Sacroiliac Joint Peripheral Nerve Denervation - Posterior Approach with Fluoroscopic Guidance   Patient: Patricia Sutton      Date of Birth: August 09, 1945 MRN: [id number] PCP: Patient, No Pcp Per      Visit Date: 10/06/2019   Universal Protocol:    Date/Time: 05/11/215:58 AM  Consent Given By: the patient  Position: PRONE  Additional Comments: Vital signs were monitored before and after the procedure. Patient was prepped and draped in the usual sterile fashion. The correct patient, procedure, and site was verified.   Injection Procedure Details:  Procedure Site One Meds Administered:  Meds ordered this encounter  Medications  . methylPREDNISolone acetate (DEPO-MEDROL) injection 40 mg     Laterality: Right  Location/Site:  L5-S1 S1-2 S2-3  Needle size: 18 G  Needle type: Cosman/[employer] radiofrequency cannula  Findings:    -Comments: Good biplanar imaging showing cannula localization with good motor stimulation.  Procedure Details: For the L5, S1 and S2 dorsal rami the fluoroscope was positioned to square off the sacrum or sacral ala to achieve a true AP midline view.  For the L5 dorsal rami the beam was then obliqued 15 to 20 degrees and caudally tilted 15 to 20 degrees to line up a trajectory along the target nerve. The skin over the target of the junction of superior articulating process and sacral ala was infiltrated with local anesthetic.  The 18 gauge 39mm active tip outer cannula was advanced in trajectory view to the target. The outer cannula placement was fine-tuned and the position was then confirmed with bi-planar imaging.  For the S1 and S2 dorsal rami, no obliquity was utilized but the same caudal tilt was used.  Three targets corresponding to the 12 o'clock and 6 o'clock and the lateral mid-line positions around the foramen were  visualized. The 18 gauge 27mm active tip outer cannula was advanced in trajectory view to each target. The outer cannula  placement was fine-tuned and the position was then confirmed with bi-planar imaging  This procedure was repeated for each target nerve position.  Then, for all levels, the outer cannula placement was fine-tuned and the position was then confirmed with bi-planar imaging.    Test stimulation was done both at sensory and motor levels to ensure there was no radicular stimulation. The target tissues were then infiltrated with 1 ml of 1% Lidocaine without Epinephrine. Subsequently, a percutaneous neurotomy was carried out for90 seconds at 80 degrees Celsius.  One 60 second neurotomy was performed for each target around the S1 and S2 foramen. After the completion of the respective lesions, 1 ml of injectate was delivered.  Appropriate radiographs were obtained to verify the probe placement during the neurotomy.  Additional Comments:  The patient tolerated the procedure well Dressing: Band-Aid    Post-procedure details: Patient was observed during the procedure. Post-procedure instructions were reviewed  Patient left the clinic in stable condition.

## 2019-10-07 NOTE — Progress Notes (Signed)
[name] - 74 y.o. female MRN [id number]  Date of birth: July 16, 1945  Office Visit Note: Visit Date: 10/06/2019 PCP: Patient, No Pcp Per Referred by: Lavada Mesi, MD  Subjective: Chief Complaint  Patient presents with  . Lower Back - Pain   HPI: Patricia Sutton is a 74 y.o. female who comes in today At the request of Dr. Vira Browns for right sided radiofrequency ablation of the right sacroiliac joint lateral branches.  This with the lateral branches at L5-S1 and S2.  She has had double diagnostic blocks by Dr. Casimiro Needle hilts using ultrasound guidance.  I have seen her in the past for radiofrequency ablation of the lumbar L4-5 and L5-S1 facet joints.  The first time we completed that she did get some relief and the second time a year later did not seem to help her as much.  As when she underwent the diagnostic sacroiliac joint injections.  Please review the notes through Dr. Vira Browns.  On exam she does have a positive Fortin finger sign on the right as well as a positive Patrick's test.  She has failed conservative care including medication management and therapy and other modalities.  This is been a chronic ongoing problem with her for several years.  ROS Otherwise per HPI.  Assessment & Plan: Visit Diagnoses:  1. Sacroiliitis (HCC)     Plan: No additional findings.   Meds & Orders:  Meds ordered this encounter  Medications  . methylPREDNISolone acetate (DEPO-MEDROL) injection 40 mg    Orders Placed This Encounter  Procedures  . Radiofrequency,Lumbar  . XR C-ARM NO REPORT    Follow-up: Return if symptoms worsen or fail to improve.   Procedures: No procedures performed  Sacroiliac Joint Peripheral Nerve Denervation - Posterior Approach with Fluoroscopic Guidance   Patient: Patricia Sutton      Date of Birth: 06/02/1945 MRN: [id number] PCP: Patient, No Pcp Per      Visit Date: 10/06/2019   Universal Protocol:    Date/Time: 05/11/215:58 AM  Consent Given By: the  patient  Position: PRONE  Additional Comments: Vital signs were monitored before and after the procedure. Patient was prepped and draped in the usual sterile fashion. The correct patient, procedure, and site was verified.   Injection Procedure Details:  Procedure Site One Meds Administered:  Meds ordered this encounter  Medications  . methylPREDNISolone acetate (DEPO-MEDROL) injection 40 mg     Laterality: Right  Location/Site:  L5-S1 S1-2 S2-3  Needle size: 18 G  Needle type: Cosman/[employer] radiofrequency cannula  Findings:    -Comments: Good biplanar imaging showing cannula localization with good motor stimulation.  Procedure Details: For the L5, S1 and S2 dorsal rami the fluoroscope was positioned to square off the sacrum or sacral ala to achieve a true AP midline view.  For the L5 dorsal rami the beam was then obliqued 15 to 20 degrees and caudally tilted 15 to 20 degrees to line up a trajectory along the target nerve. The skin over the target of the junction of superior articulating process and sacral ala was infiltrated with local anesthetic.  The 18 gauge 5mm active tip outer cannula was advanced in trajectory view to the target. The outer cannula placement was fine-tuned and the position was then confirmed with bi-planar imaging.  For the S1 and S2 dorsal rami, no obliquity was utilized but the same caudal tilt was used.  Three targets corresponding to the 12 o'clock and 6 o'clock and the lateral  mid-line positions around the foramen were  visualized. The 18 gauge 31mm active tip outer cannula was advanced in trajectory view to each target. The outer cannula placement was fine-tuned and the position was then confirmed with bi-planar imaging  This procedure was repeated for each target nerve position.  Then, for all levels, the outer cannula placement was fine-tuned and the position was then confirmed with bi-planar imaging.    Test stimulation was done both  at sensory and motor levels to ensure there was no radicular stimulation. The target tissues were then infiltrated with 1 ml of 1% Lidocaine without Epinephrine. Subsequently, a percutaneous neurotomy was carried out for90 seconds at 80 degrees Celsius.  One 60 second neurotomy was performed for each target around the S1 and S2 foramen. After the completion of the respective lesions, 1 ml of injectate was delivered.  Appropriate radiographs were obtained to verify the probe placement during the neurotomy.  Additional Comments:  The patient tolerated the procedure well Dressing: Band-Aid    Post-procedure details: Patient was observed during the procedure. Post-procedure instructions were reviewed  Patient left the clinic in stable condition.      Clinical History: MRI LUMBAR SPINE WITHOUT AND WITH CONTRAST  TECHNIQUE: Multiplanar and multiecho pulse sequences of the lumbar spine were obtained without and with intravenous contrast.  CONTRAST:  58mL MULTIHANCE GADOBENATE DIMEGLUMINE 529 MG/ML IV SOLN  COMPARISON:  Radiography 01/22/2018.  MRI 05/11/2017.  FINDINGS: Segmentation: 5 lumbar type vertebral bodies as numbered previously.  Alignment:  Normal except for 1 mm of anterolisthesis at L5-S1.  Vertebrae:  No fracture or primary bone lesion.  Conus medullaris and cauda equina: Conus extends to the L1 level. Conus and cauda equina appear normal.  Paraspinal and other soft tissues: Negative  Disc levels:  T12-L1: Shallow left paracentral disc protrusion indents the thecal sac slightly but does not cause any neural compression. No change since the previous study.  L1-2 and L2-3: Normal disc spirit mild facet hypertrophy without edema or stenosis.  L3-4: Minimal bulging of the disc. Minimal facet hypertrophy. No stenosis.  L4-5: Mild bulging of the disc. Bilateral facet degeneration and hypertrophy. No compressive stenosis. Findings could contribute  to back pain or referred facet syndrome pain.  L5-S1: 1 mm anterolisthesis. No disc abnormality. Bilateral facet degeneration worse on the left. No sign of neural compression. Findings could relate to back pain or referred facet syndrome pain.  Compared to the study of 2018, the findings are quite similar.  IMPRESSION: No change since 2018. No evidence of stenosis or neural compression. Lower lumbar degenerative changes, most notably that of facet degenerative disease at L4-5 and L5-S1 which could contribute to back pain or referred facet syndrome pain. Single most abnormal facet joint is on the left at L5-S1.   Electronically Signed   By: Paulina Fusi M.D.   On: 08/10/2018 11:58   She reports that she has been smoking cigarettes. She has a 40.00 pack-year smoking history. She has never used smokeless tobacco. No results for input(s): HGBA1C, LABURIC in the last 8760 hours.  Objective:  VS:  HT:    WT:   BMI:     BP:139/71  HR:61bpm  TEMP: ( )  RESP:  Physical Exam Constitutional:      General: She is not in acute distress.    Appearance: Normal appearance. She is not ill-appearing.  HENT:     Head: Normocephalic and atraumatic.     Right Ear: External ear normal.  Left Ear: External ear normal.  Eyes:     Extraocular Movements: Extraocular movements intact.  Cardiovascular:     Rate and Rhythm: Normal rate.     Pulses: Normal pulses.  Musculoskeletal:     Right lower leg: No edema.     Left lower leg: No edema.     Comments: Patient has good distal strength with no pain over the greater trochanters.  No clonus or focal weakness. On exam she does have a positive Fortin finger sign on the right as well as a positive Patrick's test.   Skin:    Findings: No erythema, lesion or rash.  Neurological:     General: No focal deficit present.     Mental Status: She is alert and oriented to person, place, and time.     Sensory: No sensory deficit.     Motor: No  weakness or abnormal muscle tone.     Coordination: Coordination normal.  Psychiatric:        Mood and Affect: Mood normal.        Behavior: Behavior normal.     Ortho Exam  Imaging: XR C-ARM NO REPORT  Result Date: 10/06/2019 Please see Notes tab for imaging impression.   Past Medical/Family/Surgical/Social History: Medications & Allergies reviewed per EMR, new medications updated. Patient Active Problem List   Diagnosis Date Noted  . Centrilobular emphysema (HCC) 07/03/2018  . Acute right-sided low back pain without sciatica 01/29/2018  . Anxiety 04/16/2017  . DDD (degenerative disc disease), lumbar 12/29/2016  . Tobacco use disorder 03/15/2015  . UTI (lower urinary tract infection) 03/15/2015  . COPD (chronic obstructive pulmonary disease) (HCC) 03/15/2015  . Hypothyroidism 03/15/2015  . Chronic pain syndrome 03/15/2015  . Dehydration 03/15/2015  . Elevated troponin 03/14/2015  . Osteoarthritis of right knee 07/18/2013    Class: Chronic  . Chronic hepatitis C (HCC) 11/02/2012  . Constipation 11/02/2012  . Loose body of right knee 07/16/2012    Class: Chronic   Past Medical History:  Diagnosis Date  . Anxiety   . Arthritis   . Asthma   . Back pain, chronic   . Bone spur of other site    spine  . Chronic back pain    spurs on spine  . COPD (chronic obstructive pulmonary disease) (HCC)    denies SOB  . Depression    takes Effexor daily  . GERD (gastroesophageal reflux disease)    takes Omeprazole daily  . History of bronchitis    last time about 6+yrs ago  . History of colon polyps   . History of kidney stones   . Hypothyroidism    takes Synthroid daily  . Insomnia    takes Trazodone nightly and Ambien  . Joint pain   . Joint swelling   . Nocturia   . Pneumonia    hx of;last time about 6+yrs ago  . PONV (postoperative nausea and vomiting)   . Renal insufficiency   . Sleep apnea    no CPAP; uses O2 at night 1l/min.  Marland Kitchen Urinary urgency    Family  History  Problem Relation Age of Onset  . Heart attack Mother   . Stroke Mother   . Diabetes Mellitus II Mother   . Lymphoma Father    Past Surgical History:  Procedure Laterality Date  . ABDOMINAL HYSTERECTOMY     complete  . bladder tacked    . COLONOSCOPY    . ESOPHAGOGASTRODUODENOSCOPY    . KNEE ARTHROPLASTY Right 07/18/2013  Procedure: COMPUTER ASSISTED RIGHT TOTAL KNEE ARTHROPLASTY;  Surgeon: Jessy Oto, MD;  Location: [facility];  Service: Orthopedics;  Laterality: Right;  . KNEE ARTHROSCOPY Right 07/16/2012   Procedure: ARTHROSCOPY KNEE;  Surgeon: Jessy Oto, MD;  Location: [facility];  Service: Orthopedics;  Laterality: Right;  Right knee arthroscopy with chondroplastic shaving of retropatella surface   Social History   Occupational History  . Not on file  Tobacco Use  . Smoking status: Current Some Day Smoker    Packs/day: 1.00    Years: 40.00    Pack years: 40.00    Types: Cigarettes  . Smokeless tobacco: Never Used  Substance and Sexual Activity  . Alcohol use: No  . Drug use: No  . Sexual activity: Not on file

## 2019-10-16 DIAGNOSIS — I639 Cerebral infarction, unspecified: Secondary | ICD-10-CM | POA: Insufficient documentation

## 2019-10-30 ENCOUNTER — Other Ambulatory Visit: Payer: Self-pay | Admitting: Specialist

## 2019-11-14 ENCOUNTER — Other Ambulatory Visit: Payer: Self-pay | Admitting: Specialist

## 2019-11-18 ENCOUNTER — Encounter (HOSPITAL_BASED_OUTPATIENT_CLINIC_OR_DEPARTMENT_OTHER): Payer: Self-pay | Admitting: Emergency Medicine

## 2019-11-18 ENCOUNTER — Emergency Department (HOSPITAL_BASED_OUTPATIENT_CLINIC_OR_DEPARTMENT_OTHER)
Admission: EM | Admit: 2019-11-18 | Discharge: 2019-11-18 | Discharge status: Against Medical Advice | Payer: Medicare HMO | Attending: Emergency Medicine | Admitting: Emergency Medicine

## 2019-11-18 ENCOUNTER — Other Ambulatory Visit: Payer: Self-pay

## 2019-11-18 DIAGNOSIS — R300 Dysuria: Secondary | ICD-10-CM | POA: Insufficient documentation

## 2019-11-18 DIAGNOSIS — R1012 Left upper quadrant pain: Secondary | ICD-10-CM | POA: Diagnosis not present

## 2019-11-18 DIAGNOSIS — R1011 Right upper quadrant pain: Secondary | ICD-10-CM | POA: Insufficient documentation

## 2019-11-18 DIAGNOSIS — R1030 Lower abdominal pain, unspecified: Secondary | ICD-10-CM | POA: Insufficient documentation

## 2019-11-18 LAB — URINALYSIS, ROUTINE W REFLEX MICROSCOPIC
Bilirubin Urine: NEGATIVE
Glucose, UA: NEGATIVE mg/dL
Ketones, ur: NEGATIVE mg/dL
Nitrite: NEGATIVE
Protein, ur: NEGATIVE mg/dL
Specific Gravity, Urine: 1.01 (ref 1.005–1.030)
pH: 6 (ref 5.0–8.0)

## 2019-11-18 LAB — URINALYSIS, MICROSCOPIC (REFLEX)

## 2019-11-18 NOTE — ED Triage Notes (Signed)
PT reports pain to BUQ and lower abd for a couple of days; denies NVD; + dysuria

## 2019-11-26 ENCOUNTER — Other Ambulatory Visit: Payer: Self-pay | Admitting: Specialist

## 2019-12-05 ENCOUNTER — Other Ambulatory Visit: Payer: Self-pay | Admitting: Specialist

## 2019-12-05 NOTE — Telephone Encounter (Signed)
Patient notified

## 2019-12-23 ENCOUNTER — Other Ambulatory Visit: Payer: Self-pay | Admitting: Specialist

## 2020-01-15 ENCOUNTER — Other Ambulatory Visit: Payer: Self-pay | Admitting: Specialist

## 2020-01-15 NOTE — Telephone Encounter (Signed)
Please advise 

## 2020-01-27 ENCOUNTER — Other Ambulatory Visit: Payer: Self-pay | Admitting: Specialist

## 2020-02-03 ENCOUNTER — Ambulatory Visit: Payer: Medicare HMO | Admitting: Family Medicine

## 2020-02-09 ENCOUNTER — Ambulatory Visit (INDEPENDENT_AMBULATORY_CARE_PROVIDER_SITE_OTHER): Payer: Medicare HMO | Admitting: Family Medicine

## 2020-02-09 ENCOUNTER — Other Ambulatory Visit: Payer: Self-pay

## 2020-02-09 ENCOUNTER — Ambulatory Visit: Payer: Self-pay

## 2020-02-09 DIAGNOSIS — M25561 Pain in right knee: Secondary | ICD-10-CM | POA: Diagnosis not present

## 2020-02-09 DIAGNOSIS — M533 Sacrococcygeal disorders, not elsewhere classified: Secondary | ICD-10-CM | POA: Diagnosis not present

## 2020-02-09 DIAGNOSIS — G8929 Other chronic pain: Secondary | ICD-10-CM

## 2020-02-09 NOTE — Progress Notes (Signed)
Office Visit Note   Patient: Patricia Sutton           Date of Birth: 15-Feb-1946           MRN: [id number] Visit Date: 02/09/2020 Requested by: No referring provider defined for this encounter. PCP: Patient, No Pcp Per  Subjective: Chief Complaint  Patient presents with  . Lower Back - Pain  . Right Knee - Pain    HPI: She is here with bilateral SI joint pain and bilateral knee pain.  She is status post bilateral knee replacements.  Right knee hurts more than the left.  SI injection helps for a little while.              ROS:   All other systems were reviewed and are negative.  Objective: Vital Signs: There were no vitals taken for this visit.  Physical Exam:  General:  Alert and oriented, in no acute distress. Pulm:  Breathing unlabored. Psy:  Normal mood, congruent affect  Low back: Tender over both SI joints. Right knee: Tender near the pes bursa area.  No effusion, no erythema or warmth.  Imaging: US Guided Needle Placement - No Linked Charges  Result Date: 02/09/2020 Ultrasound-guided bilateral SI injection: After sterile prep with Betadine, injected 5 cc 1% lidocaine without epinephrine and 40 mg methylprednisolone using a 22-gauge spinal needle, passing the needle through the sacroiliac ligament into the region of the SI joint.       Assessment & Plan: 1.  Bilateral SI joint dysfunction -Injections today under ultrasound guidance.  2.  Bilateral knee pain, right greater than left -Pes bursa injection on the right.  Follow-up with [name] later this month.     Procedures: No procedures performed  No notes on file     PMFS History: Patient Active Problem List   Diagnosis Date Noted  . Centrilobular emphysema (HCC) 07/03/2018  . Acute right-sided low back pain without sciatica 01/29/2018  . Anxiety 04/16/2017  . DDD (degenerative disc disease), lumbar 12/29/2016  . Tobacco use disorder 03/15/2015  . UTI (lower urinary tract infection) 03/15/2015  .  COPD (chronic obstructive pulmonary disease) (HCC) 03/15/2015  . Hypothyroidism 03/15/2015  . Chronic pain syndrome 03/15/2015  . Dehydration 03/15/2015  . Elevated troponin 03/14/2015  . Osteoarthritis of right knee 07/18/2013    Class: Chronic  . Chronic hepatitis C (HCC) 11/02/2012  . Constipation 11/02/2012  . Loose body of right knee 07/16/2012    Class: Chronic   Past Medical History:  Diagnosis Date  . Anxiety   . Arthritis   . Asthma   . Back pain, chronic   . Bone spur of other site    spine  . Chronic back pain    spurs on spine  . COPD (chronic obstructive pulmonary disease) (HCC)    denies SOB  . Depression    takes Effexor daily  . GERD (gastroesophageal reflux disease)    takes Omeprazole daily  . History of bronchitis    last time about 6+yrs ago  . History of colon polyps   . History of kidney stones   . Hypothyroidism    takes Synthroid daily  . Insomnia    takes Trazodone nightly and Ambien  . Joint pain   . Joint swelling   . Nocturia   . Pneumonia    hx of;last time about 6+yrs ago  . PONV (postoperative nausea and vomiting)   . Renal insufficiency   . Sleep apnea  no CPAP; uses O2 at night 1l/min.  Marland Kitchen Urinary urgency     Family History  Problem Relation Age of Onset  . Heart attack Mother   . Stroke Mother   . Diabetes Mellitus II Mother   . Lymphoma Father     Past Surgical History:  Procedure Laterality Date  . ABDOMINAL HYSTERECTOMY     complete  . bladder tacked    . COLONOSCOPY    . ESOPHAGOGASTRODUODENOSCOPY    . KNEE ARTHROPLASTY Right 07/18/2013   Procedure: COMPUTER ASSISTED RIGHT TOTAL KNEE ARTHROPLASTY;  Surgeon: Kerrin Champagne, MD;  Location: [facility];  Service: Orthopedics;  Laterality: Right;  . KNEE ARTHROSCOPY Right 07/16/2012   Procedure: ARTHROSCOPY KNEE;  Surgeon: Kerrin Champagne, MD;  Location: [facility];  Service: Orthopedics;  Laterality: Right;  Right knee arthroscopy with chondroplastic shaving of  retropatella surface   Social History   Occupational History  . Not on file  Tobacco Use  . Smoking status: Current Some Day Smoker    Packs/day: 1.00    Years: 40.00    Pack years: 40.00    Types: Cigarettes  . Smokeless tobacco: Never Used  Vaping Use  . Vaping Use: Some days  Substance and Sexual Activity  . Alcohol use: No  . Drug use: No  . Sexual activity: Not on file

## 2020-02-13 ENCOUNTER — Other Ambulatory Visit: Payer: Self-pay | Admitting: Specialist

## 2020-02-16 ENCOUNTER — Other Ambulatory Visit: Payer: Self-pay | Admitting: Radiology

## 2020-02-16 ENCOUNTER — Other Ambulatory Visit: Payer: Self-pay | Admitting: Specialist

## 2020-02-20 ENCOUNTER — Telehealth: Payer: Self-pay | Admitting: Physical Medicine and Rehabilitation

## 2020-02-20 NOTE — Telephone Encounter (Signed)
Pt called stating she would like to get an appt to get the nerves in her back burned   [phone]

## 2020-02-23 NOTE — Telephone Encounter (Signed)
So if no improvement with RFA then doing it again will not work. Can f/up with Hilts or me to talk

## 2020-02-23 NOTE — Telephone Encounter (Signed)
Patient had bilateral L4-5 and L5-S1 RFA in July 2020. She reported not improvement of her pain with this. She had right SI joint RFA 10/06/19 and bilateral SI joint injections with Dr. Prince Rome on 02/09/20. Please advise.

## 2020-02-23 NOTE — Telephone Encounter (Signed)
Called patient to advise. Scheduled for OV with Dr. Prince Rome.

## 2020-02-27 ENCOUNTER — Ambulatory Visit: Payer: Medicare HMO | Admitting: Family Medicine

## 2020-02-27 ENCOUNTER — Other Ambulatory Visit: Payer: Self-pay | Admitting: Specialist

## 2020-02-27 NOTE — Telephone Encounter (Signed)
Please advise 

## 2020-03-01 ENCOUNTER — Other Ambulatory Visit: Payer: Self-pay

## 2020-03-01 ENCOUNTER — Encounter: Payer: Self-pay | Admitting: Specialist

## 2020-03-01 ENCOUNTER — Ambulatory Visit (INDEPENDENT_AMBULATORY_CARE_PROVIDER_SITE_OTHER): Payer: Medicare HMO | Admitting: Specialist

## 2020-03-01 ENCOUNTER — Ambulatory Visit: Payer: Self-pay

## 2020-03-01 VITALS — BP 175/73 | HR 64 | Ht 63.0 in | Wt 127.4 lb

## 2020-03-01 DIAGNOSIS — M4726 Other spondylosis with radiculopathy, lumbar region: Secondary | ICD-10-CM

## 2020-03-01 DIAGNOSIS — M25561 Pain in right knee: Secondary | ICD-10-CM | POA: Diagnosis not present

## 2020-03-01 DIAGNOSIS — G8929 Other chronic pain: Secondary | ICD-10-CM | POA: Diagnosis not present

## 2020-03-01 DIAGNOSIS — M1611 Unilateral primary osteoarthritis, right hip: Secondary | ICD-10-CM | POA: Diagnosis not present

## 2020-03-01 DIAGNOSIS — M25551 Pain in right hip: Secondary | ICD-10-CM | POA: Diagnosis not present

## 2020-03-01 NOTE — Patient Instructions (Signed)
The main ways of treat osteoarthritis, that are found to be success. Weight loss helps to decrease pain. Exercise is important to maintaining cartilage and thickness and strengthening. NSAIDs like motrin, tylenol, alleve are meds decreasing the inflamation. Ice is okay  In afternoon and evening and hot shower in the am Avoid bending, stooping and avoid lifting weights greater than 10 lbs. Avoid prolong standing and walking. Avoid frequent bending and stooping  No lifting greater than 10 lbs. May use ice or moist heat for pain. Weight loss is of benefit. Handicap license is approved. Dr. Seneca Blas secretary/Assistant will call to arrange for right hip injection for osteoarthritis with right knee pain, right knee exam is normal, Radiographs right knee is normal. Right hip radiographs with central femoral osteophytes, medial joint narrowing.

## 2020-03-01 NOTE — Progress Notes (Signed)
Office Visit Note   Patient: Patricia Sutton           Date of Birth: September 23, 1945           MRN: [id number] Visit Date: 03/01/2020              Requested by: No referring provider defined for this encounter. PCP: Patient, No Pcp Per   Assessment & Plan: Visit Diagnoses:  1. Chronic pain of right knee   2. Pain in right hip     Plan: The main ways of treat osteoarthritis, that are found to be success. Weight loss helps to decrease pain. Exercise is important to maintaining cartilage and thickness and strengthening. NSAIDs like motrin, tylenol, alleve are meds decreasing the inflamation. Ice is okay  In afternoon and evening and hot shower in the am Avoid bending, stooping and avoid lifting weights greater than 10 lbs. Avoid prolong standing and walking. Avoid frequent bending and stooping  No lifting greater than 10 lbs. May use ice or moist heat for pain. Weight loss is of benefit. Handicap license is approved. Dr. Garland Blas secretary/Assistant will call to arrange for right hip injection for osteoarthritis with right knee pain, right knee exam is normal, Radiographs right knee is normal. Right hip radiographs with central femoral osteophytes, medial joint narrowing.    Follow-Up Instructions: No follow-ups on file.   Orders:  Orders Placed This Encounter  Procedures  . XR KNEE 3 VIEW RIGHT  . XR HIP UNILAT W OR W/O PELVIS 2-3 VIEWS RIGHT  . Ambulatory referral to Physical Medicine Rehab   No orders of the defined types were placed in this encounter.     Procedures: No procedures performed   Clinical Data: No additional findings.   Subjective: Chief Complaint  Patient presents with  . Lower Back - Pain  . Right Knee - Pain    HPI  Review of Systems   Objective: Vital Signs: BP (!) 175/73 (BP Location: Left Arm, Patient Position: Sitting)   Pulse 64   Ht 5\' 3"  (1.6 m)   Wt 127 lb 6.4 oz (57.8 kg)   BMI 22.57 kg/m   Physical Exam  Ortho  Exam  Specialty Comments:  No specialty comments available.  Imaging: XR KNEE 3 VIEW RIGHT  Result Date: 03/01/2020 AP standing both knees and lateral with patella sunrise radiographs show well seated right total knee arthroplasty ingood postion and alignment.    PMFS History: Patient Active Problem List   Diagnosis Date Noted  . Osteoarthritis of right knee 07/18/2013    Priority: High    Class: Chronic  . Loose body of right knee 07/16/2012    Priority: High    Class: Chronic  . Centrilobular emphysema (HCC) 07/03/2018  . Acute right-sided low back pain without sciatica 01/29/2018  . Anxiety 04/16/2017  . DDD (degenerative disc disease), lumbar 12/29/2016  . Tobacco use disorder 03/15/2015  . UTI (lower urinary tract infection) 03/15/2015  . COPD (chronic obstructive pulmonary disease) (HCC) 03/15/2015  . Hypothyroidism 03/15/2015  . Chronic pain syndrome 03/15/2015  . Dehydration 03/15/2015  . Elevated troponin 03/14/2015  . Chronic hepatitis C (HCC) 11/02/2012  . Constipation 11/02/2012   Past Medical History:  Diagnosis Date  . Anxiety   . Arthritis   . Asthma   . Back pain, chronic   . Bone spur of other site    spine  . Chronic back pain    spurs on spine  . COPD (chronic obstructive  pulmonary disease) (HCC)    denies SOB  . Depression    takes Effexor daily  . GERD (gastroesophageal reflux disease)    takes Omeprazole daily  . History of bronchitis    last time about 6+yrs ago  . History of colon polyps   . History of kidney stones   . Hypothyroidism    takes Synthroid daily  . Insomnia    takes Trazodone nightly and Ambien  . Joint pain   . Joint swelling   . Nocturia   . Pneumonia    hx of;last time about 6+yrs ago  . PONV (postoperative nausea and vomiting)   . Renal insufficiency   . Sleep apnea    no CPAP; uses O2 at night 1l/min.  Marland Kitchen Urinary urgency     Family History  Problem Relation Age of Onset  . Heart attack Mother   . Stroke  Mother   . Diabetes Mellitus II Mother   . Lymphoma Father     Past Surgical History:  Procedure Laterality Date  . ABDOMINAL HYSTERECTOMY     complete  . bladder tacked    . COLONOSCOPY    . ESOPHAGOGASTRODUODENOSCOPY    . KNEE ARTHROPLASTY Right 07/18/2013   Procedure: COMPUTER ASSISTED RIGHT TOTAL KNEE ARTHROPLASTY;  Surgeon: Kerrin Champagne, MD;  Location: [facility];  Service: Orthopedics;  Laterality: Right;  . KNEE ARTHROSCOPY Right 07/16/2012   Procedure: ARTHROSCOPY KNEE;  Surgeon: Kerrin Champagne, MD;  Location: [facility];  Service: Orthopedics;  Laterality: Right;  Right knee arthroscopy with chondroplastic shaving of retropatella surface   Social History   Occupational History  . Not on file  Tobacco Use  . Smoking status: Current Some Day Smoker    Packs/day: 1.00    Years: 40.00    Pack years: 40.00    Types: Cigarettes  . Smokeless tobacco: Never Used  Vaping Use  . Vaping Use: Some days  Substance and Sexual Activity  . Alcohol use: No  . Drug use: No  . Sexual activity: Not on file

## 2020-03-08 ENCOUNTER — Telehealth: Payer: Self-pay | Admitting: Physical Medicine and Rehabilitation

## 2020-03-08 NOTE — Telephone Encounter (Signed)
Patient called. She would like an appointment with Dr. Newton.  

## 2020-03-09 ENCOUNTER — Other Ambulatory Visit: Payer: Self-pay | Admitting: Specialist

## 2020-03-09 NOTE — Telephone Encounter (Signed)
Scheduled

## 2020-03-17 ENCOUNTER — Telehealth: Payer: Self-pay | Admitting: Physical Medicine and Rehabilitation

## 2020-03-17 NOTE — Telephone Encounter (Signed)
Called patient to let her know that she does not need a driver.

## 2020-03-17 NOTE — Telephone Encounter (Signed)
Pt called wanting to check wether she will need a driver on her appt for 78/67/67?  [phone]

## 2020-03-19 ENCOUNTER — Other Ambulatory Visit: Payer: Self-pay | Admitting: Family

## 2020-03-19 ENCOUNTER — Other Ambulatory Visit: Payer: Self-pay | Admitting: Specialist

## 2020-03-19 MED ORDER — TRAMADOL HCL 50 MG PO TABS: ORAL_TABLET | ORAL | 0 refills | Status: DC

## 2020-03-19 NOTE — Telephone Encounter (Signed)
Pt called asking for a refill of her tra\madol please  [phone]

## 2020-03-19 NOTE — Telephone Encounter (Signed)
Sent request to [name]

## 2020-03-24 ENCOUNTER — Telehealth: Payer: Self-pay | Admitting: Physical Medicine and Rehabilitation

## 2020-03-24 ENCOUNTER — Ambulatory Visit: Payer: Medicare HMO | Admitting: Physical Medicine and Rehabilitation

## 2020-03-24 NOTE — Telephone Encounter (Signed)
Appointment cancelled at patient's request.

## 2020-03-24 NOTE — Telephone Encounter (Signed)
Pt called and would like to cancel her appt as she has not had any pain in over a week.

## 2020-05-02 ENCOUNTER — Encounter: Payer: Self-pay | Admitting: Nurse Practitioner

## 2020-05-02 ENCOUNTER — Other Ambulatory Visit: Payer: Self-pay | Admitting: Nurse Practitioner

## 2020-05-02 DIAGNOSIS — Z789 Other specified health status: Secondary | ICD-10-CM | POA: Insufficient documentation

## 2020-05-02 DIAGNOSIS — Z9981 Dependence on supplemental oxygen: Secondary | ICD-10-CM | POA: Insufficient documentation

## 2020-05-03 ENCOUNTER — Telehealth: Payer: Self-pay | Admitting: Physician Assistant

## 2020-05-03 NOTE — Telephone Encounter (Signed)
Called to discuss with patient about Covid symptoms and the use of sotrovimab, bamlanivimab/etesevimab or casirivimab/imdevimab, a monoclonal antibody infusion for those with mild to moderate Covid symptoms and at a high risk of hospitalization.  Pt is qualified for this infusion at the [facility] due to; Specific high risk criteria : Older age (>/= 72 yo), Cardiovascular disease or hypertension and Chronic Lung Disease   Message left to call back our hotline [phone]. My chart message sent if active on Mychart.   Cline Crock PA-C
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
## 2020-09-14 ENCOUNTER — Other Ambulatory Visit: Payer: Self-pay | Admitting: Specialist

## 2020-11-12 DIAGNOSIS — R1084 Generalized abdominal pain: Secondary | ICD-10-CM | POA: Insufficient documentation

## 2020-11-12 DIAGNOSIS — K529 Noninfective gastroenteritis and colitis, unspecified: Secondary | ICD-10-CM | POA: Insufficient documentation

## 2021-03-01 ENCOUNTER — Other Ambulatory Visit: Payer: Self-pay | Admitting: Specialist

## 2021-03-01 NOTE — Telephone Encounter (Signed)
Pt called asking for a refill of her tramadol; and would like a CB when that's been sent in.   [phone]

## 2021-03-02 NOTE — Telephone Encounter (Signed)
I called and advised patient that her medicine was denied, and told her why, she states that she did get some oxycodone from her PCP for some other pain and that was a couple of months ago.-I advised that I would pass that information a long to [name]

## 2021-05-12 ENCOUNTER — Ambulatory Visit: Payer: Medicare HMO | Admitting: Specialist

## 2021-05-19 ENCOUNTER — Ambulatory Visit: Payer: Medicare HMO | Admitting: Specialist

## 2021-06-16 ENCOUNTER — Encounter: Payer: Self-pay | Admitting: Specialist

## 2021-06-16 ENCOUNTER — Ambulatory Visit: Payer: Self-pay

## 2021-06-16 ENCOUNTER — Ambulatory Visit (INDEPENDENT_AMBULATORY_CARE_PROVIDER_SITE_OTHER): Payer: Medicare HMO | Admitting: Specialist

## 2021-06-16 ENCOUNTER — Other Ambulatory Visit: Payer: Self-pay

## 2021-06-16 VITALS — BP 154/76 | HR 69 | Ht 63.0 in | Wt 93.0 lb

## 2021-06-16 DIAGNOSIS — M533 Sacrococcygeal disorders, not elsewhere classified: Secondary | ICD-10-CM

## 2021-06-16 DIAGNOSIS — M25561 Pain in right knee: Secondary | ICD-10-CM

## 2021-06-16 DIAGNOSIS — G8929 Other chronic pain: Secondary | ICD-10-CM

## 2021-06-16 DIAGNOSIS — M4807 Spinal stenosis, lumbosacral region: Secondary | ICD-10-CM

## 2021-06-16 DIAGNOSIS — M7051 Other bursitis of knee, right knee: Secondary | ICD-10-CM

## 2021-06-16 DIAGNOSIS — M1712 Unilateral primary osteoarthritis, left knee: Secondary | ICD-10-CM

## 2021-06-16 DIAGNOSIS — M47816 Spondylosis without myelopathy or radiculopathy, lumbar region: Secondary | ICD-10-CM | POA: Diagnosis not present

## 2021-06-16 MED ORDER — METHYLPREDNISOLONE ACETATE 40 MG/ML IJ SUSP
40.0000 mg | INTRAMUSCULAR | Status: AC | PRN
  Administered 2021-06-16: 40 mg via INTRA_ARTICULAR

## 2021-06-16 MED ORDER — BUPIVACAINE HCL 0.5 % IJ SOLN
5.0000 mL | INTRAMUSCULAR | Status: AC | PRN
  Administered 2021-06-16: 5 mL via INTRA_ARTICULAR

## 2021-06-16 MED ORDER — TRAMADOL HCL 50 MG PO TABS: 50.0000 mg | ORAL_TABLET | Freq: Four times a day (QID) | ORAL | 0 refills | Status: DC | PRN

## 2021-06-16 NOTE — Progress Notes (Addendum)
Office Visit Note   Patient: Patricia Sutton           Date of Birth: 1946/04/11           MRN: [id number] Visit Date: 06/16/2021              Requested by: No referring provider defined for this encounter. PCP: Patient, No Pcp Per (Inactive)   Assessment & Plan: Visit Diagnoses:  1. Spinal stenosis of lumbosacral region   2. Chronic pain of right knee   3. Chronic right SI joint pain   4. Chronic left SI joint pain   5. Spondylosis without myelopathy or radiculopathy, lumbar region   6. Pes anserinus bursitis of right knee   7. Unilateral primary osteoarthritis, left knee     Plan: Avoid bending, stooping and avoid lifting weights greater than 10 lbs. Avoid prolong standing and walking. Avoid frequent bending and stooping  No lifting greater than 10 lbs. May use ice or moist heat for pain. Weight loss is of benefit. Handicap license is approved. Dr. Keuka Park Blas secretary/Assistant will call to arrange for right and left SI joint injections   Follow-Up Instructions: No follow-ups on file.   Orders:  Orders Placed This Encounter  Procedures   Large Joint Inj: L knee   Large Joint Inj: R knee   XR Lumbar Spine 2-3 Views   Ambulatory referral to Physical Medicine Rehab   Meds ordered this encounter  Medications   traMADol (ULTRAM) 50 MG tablet    Sig: Take 1 tablet (50 mg total) by mouth every 6 (six) hours as needed for moderate pain.    Dispense:  30 tablet    Refill:  0      Procedures: Large Joint Inj: L knee on 06/16/2021 4:28 PM Indications: pain Details: 25 G 1.5 in needle, anterolateral approach  Arthrogram: No  Medications: 40 mg methylPREDNISolone acetate 40 MG/ML; 5 mL bupivacaine 0.5 % Outcome: tolerated well, no immediate complications  Bandaid applied Procedure, treatment alternatives, risks and benefits explained, specific risks discussed. Consent was given by the patient. Immediately prior to procedure a time out was called to verify the  correct patient, procedure, equipment, support staff and site/side marked as required. Patient was prepped and draped in the usual sterile fashion.    Large Joint Inj: R knee on 06/16/2021 4:29 PM Indications: pain Details: 25 G 1.5 in needle, anteromedial approach  Arthrogram: No  Medications: 40 mg methylPREDNISolone acetate 40 MG/ML; 5 mL bupivacaine 0.5 % Outcome: tolerated well, no immediate complications  Right pes anserina bursa injection. Procedure, treatment alternatives, risks and benefits explained, specific risks discussed. Consent was given by the patient. Immediately prior to procedure a time out was called to verify the correct patient, procedure, equipment, support staff and site/side marked as required. Patient was prepped and draped in the usual sterile fashion.     Clinical Data: No additional findings.   Subjective: Chief Complaint  Patient presents with   Lower Back - Follow-up, Pain    76 year old right handed female with history of back pain And was to undergo a RFA of the nerves in the lumbar spine. Last night she was shopping and the legs started hurting and the ankles are painful and the right back. No bowel or bladder difficulty. Got COVID December 2021 and has had problems with diarrhea after eating and also stomach pain and difficulty with weight loss. No appetite. Saw GI medicine and they did colonoscopy and pain in  the stomach is better but she is still having diarrhea. She complains of bilateral pain into her knees right below the knee medially and left medial joint line pain and tenderness. She requests injection of both knees and a back injection too.    Review of Systems  Constitutional: Negative.   HENT: Negative.    Eyes: Negative.   Respiratory: Negative.    Cardiovascular: Negative.   Gastrointestinal: Negative.   Endocrine: Negative.   Genitourinary: Negative.   Musculoskeletal: Negative.   Skin: Negative.   Allergic/Immunologic:  Negative.   Neurological: Negative.   Hematological: Negative.   Psychiatric/Behavioral: Negative.      Objective: Vital Signs: BP (!) 154/76 (BP Location: Left Arm, Patient Position: Sitting)    Pulse 69    Ht 5\' 3"  (1.6 m)    Wt 93 lb (42.2 kg)    BMI 16.47 kg/m   Physical Exam Constitutional:      Appearance: She is well-developed.  HENT:     Head: Normocephalic and atraumatic.  Eyes:     Pupils: Pupils are equal, round, and reactive to light.  Pulmonary:     Effort: Pulmonary effort is normal.     Breath sounds: Normal breath sounds.  Abdominal:     General: Bowel sounds are normal.     Palpations: Abdomen is soft.  Musculoskeletal:     Cervical back: Normal range of motion and neck supple.     Right knee:     Instability Tests: Medial McMurray test negative and lateral McMurray test negative.     Left knee:     Instability Tests: Medial McMurray test positive. Lateral McMurray test negative.  Skin:    General: Skin is warm and dry.  Neurological:     Mental Status: She is alert and oriented to person, place, and time.  Psychiatric:        Behavior: Behavior normal.        Thought Content: Thought content normal.        Judgment: Judgment normal.    Right Knee Exam   Muscle Strength  The patient has normal right knee strength.  Tenderness  The patient is experiencing tenderness in the pes anserinus.  Range of Motion  Extension:  normal  Flexion:  normal   Tests  McMurray:  Medial - negative Lateral - negative Varus: negative Valgus: negative  Other  Erythema: absent Scars: present Sensation: decreased Pulse: present Swelling: none  Comments:  Tender right pes anserinus   Left Knee Exam   Muscle Strength  The patient has normal left knee strength.  Tenderness  The patient is experiencing tenderness in the medial joint line.  Range of Motion  Extension:  -5 abnormal  Flexion:  normal   Tests  McMurray:  Medial - positive Lateral -  negative Varus: positive Valgus: negative Lachman:  Anterior - negative    Posterior - negative Drawer:  Anterior - negative     Posterior - negative  Other  Erythema: absent Scars: absent Sensation: normal Pulse: present Swelling: mild  Comments:  Right medial joint line tenderness Minimal swelling.   Back Exam   Tenderness  The patient is experiencing tenderness in the lumbar.  Muscle Strength  Right Quadriceps:  5/5  Left Quadriceps:  5/5  Right Hamstrings:  5/5  Left Hamstrings:  5/5   Reflexes  Patellar:  2/4 Achilles:  0/4  Comments:  SLR negative Tender right paralumbar spine and right PSIS and right SI joint. Pain with go  to the right heel.     Specialty Comments:  No specialty comments available.  Imaging: No results found.   PMFS History: Patient Active Problem List   Diagnosis Date Noted   Osteoarthritis of right knee 07/18/2013    Priority: High    Class: Chronic   Loose body of right knee 07/16/2012    Priority: High    Class: Chronic   Decreased activities of daily living (ADL) 05/02/2020   Oxygen dependent 05/02/2020   Stroke (cerebrum) (HCC) 10/16/2019   Centrilobular emphysema (HCC) 07/03/2018   Acute right-sided low back pain without sciatica 01/29/2018   Anxiety 04/16/2017   DDD (degenerative disc disease), lumbar 12/29/2016   Nocturnal hypoxemia 09/28/2016   Smoking greater than 40 pack years 09/28/2016   Tobacco use disorder 03/15/2015   UTI (lower urinary tract infection) 03/15/2015   COPD (chronic obstructive pulmonary disease) (HCC) 03/15/2015   Hypothyroidism 03/15/2015   Chronic pain syndrome 03/15/2015   Dehydration 03/15/2015   Elevated troponin 03/14/2015   Chronic hepatitis C (HCC) 11/02/2012   Constipation 11/02/2012   Past Medical History:  Diagnosis Date   Anxiety    Arthritis    Asthma    Back pain, chronic    Bone spur of other site    spine   Chronic back pain    spurs on  spine   COPD (chronic obstructive pulmonary disease) (HCC)    denies SOB   Depression    takes Effexor daily   GERD (gastroesophageal reflux disease)    takes Omeprazole daily   History of bronchitis    last time about 6+yrs ago   History of colon polyps    History of kidney stones    Hypothyroidism    takes Synthroid daily   Insomnia    takes Trazodone nightly and Ambien   Joint pain    Joint swelling    Nocturia    Pneumonia    hx of;last time about 6+yrs ago   PONV (postoperative nausea and vomiting)    Renal insufficiency    Sleep apnea    no CPAP; uses O2 at night 1l/min.   Urinary urgency     Family History  Problem Relation Age of Onset   Heart attack Mother    Stroke Mother    Diabetes Mellitus II Mother    Lymphoma Father     Past Surgical History:  Procedure Laterality Date   ABDOMINAL HYSTERECTOMY     complete   bladder tacked     COLONOSCOPY     ESOPHAGOGASTRODUODENOSCOPY     KNEE ARTHROPLASTY Right 07/18/2013   Procedure: COMPUTER ASSISTED RIGHT TOTAL KNEE ARTHROPLASTY;  Surgeon: Kerrin Champagne, MD;  Location: [facility] OR;  Service: Orthopedics;  Laterality: Right;   KNEE ARTHROSCOPY Right 07/16/2012   Procedure: ARTHROSCOPY KNEE;  Surgeon: Kerrin Champagne, MD;  Location: [facility];  Service: Orthopedics;  Laterality: Right;  Right knee arthroscopy with chondroplastic shaving of retropatella surface   Social History   Occupational History   Not on file  Tobacco Use   Smoking status: Some Days    Packs/day: 1.00    Years: 40.00    Pack years: 40.00    Types: Cigarettes   Smokeless tobacco: Never  Vaping Use   Vaping Use: Some days  Substance and Sexual Activity   Alcohol use: No   Drug use: No   Sexual activity: Not on file

## 2021-06-21 ENCOUNTER — Telehealth: Payer: Self-pay | Admitting: Specialist

## 2021-06-21 NOTE — Telephone Encounter (Signed)
Patient called asked if Dr. Otelia Sutton thinks she need surgery. Patient asked what does her weight have to do with her having surgery. Patient asked if Dr. Otelia Sutton could explain this to her. The number to contact patient is [phone]

## 2021-06-23 ENCOUNTER — Telehealth: Payer: Self-pay | Admitting: Specialist

## 2021-06-23 NOTE — Telephone Encounter (Signed)
Pt called requesting a call back from [name] or Neysa Bonito pertaining to her surgery she would like to have. Please call pt at [phone].

## 2021-06-24 NOTE — Telephone Encounter (Signed)
I called and advised that I have left her message with Dr. Nitka and that he has not responded to it yet but as soon as I get a response from him that I would call her and let her know.  She said that was ok. °

## 2021-06-24 NOTE — Telephone Encounter (Signed)
I called and advised that I have left her message with [name] and that he has not responded to it yet but as soon as I get a response from him that I would call her and let her know.  She said that was ok.

## 2021-06-28 DIAGNOSIS — M8589 Other specified disorders of bone density and structure, multiple sites: Secondary | ICD-10-CM | POA: Insufficient documentation

## 2021-06-28 DIAGNOSIS — E119 Type 2 diabetes mellitus without complications: Secondary | ICD-10-CM | POA: Insufficient documentation

## 2021-06-29 NOTE — Telephone Encounter (Signed)
I spoke with [name], He does not recommend surgery for her at this time, however as far as her weight, it is not about her weight its about the poor nutrition and the smoking that would impact her ability to heal.  I called and advised patient of this. And she states that she understands.

## 2021-07-05 ENCOUNTER — Ambulatory Visit (INDEPENDENT_AMBULATORY_CARE_PROVIDER_SITE_OTHER): Payer: Medicare HMO | Admitting: Physical Medicine and Rehabilitation

## 2021-07-05 ENCOUNTER — Ambulatory Visit: Payer: Self-pay

## 2021-07-05 ENCOUNTER — Other Ambulatory Visit: Payer: Self-pay

## 2021-07-05 ENCOUNTER — Encounter: Payer: Self-pay | Admitting: Physical Medicine and Rehabilitation

## 2021-07-05 DIAGNOSIS — M461 Sacroiliitis, not elsewhere classified: Secondary | ICD-10-CM

## 2021-07-05 DIAGNOSIS — M47816 Spondylosis without myelopathy or radiculopathy, lumbar region: Secondary | ICD-10-CM

## 2021-07-05 DIAGNOSIS — M545 Low back pain, unspecified: Secondary | ICD-10-CM

## 2021-07-05 DIAGNOSIS — G894 Chronic pain syndrome: Secondary | ICD-10-CM

## 2021-07-05 DIAGNOSIS — R202 Paresthesia of skin: Secondary | ICD-10-CM

## 2021-07-05 NOTE — Patient Instructions (Signed)

## 2021-07-05 NOTE — Progress Notes (Signed)
Pt state lower back pain that travels to both legs. Pt state walking, sitting and standing makes the pain worse. Pt state uses heat/ ice and pain meds to help ease her pain.  Numeric Pain Rating Scale and Functional Assessment Average Pain 8   In the last MONTH (on 0-10 scale) has pain interfered with the following?  1. General activity like being  able to carry out your everyday physical activities such as walking, climbing stairs, carrying groceries, or moving a chair?  Rating(10)   +Driver, -BT, -Dye Allergies.  

## 2021-07-09 ENCOUNTER — Encounter: Payer: Self-pay | Admitting: Physical Medicine and Rehabilitation

## 2021-07-09 MED ORDER — BUPIVACAINE HCL 0.5 % IJ SOLN
2.0000 mL | INTRAMUSCULAR | Status: AC | PRN
  Administered 2021-07-05: 2 mL via INTRA_ARTICULAR

## 2021-07-09 MED ORDER — TRIAMCINOLONE ACETONIDE 40 MG/ML IJ SUSP
40.0000 mg | INTRAMUSCULAR | Status: AC | PRN
  Administered 2021-07-05: 40 mg via INTRA_ARTICULAR

## 2021-07-09 NOTE — Progress Notes (Signed)
Patricia Sutton - 76 y.o. female MRN [id number]  Date of birth: 1946/02/17  Office Visit Note: Visit Date: 07/05/2021 PCP: Patient, No Pcp Per (Inactive) Referred by: Kerrin Champagne, MD  Subjective: Chief Complaint  Patient presents with   Lower Back - Pain   Right Leg - Pain   Left Leg - Pain   HPI: Patricia Sutton is a 76 y.o. female who comes in today for evaluation and management of chronic worsening severe low back pain and bilateral buttock pain with some referral into the hips and legs.  She is managed from an orthopedic spine standpoint by Dr. Vira Browns.  The patient is a very poor historian.  She recently saw [name] who felt like she might benefit from bilateral sacroiliac joint injections.  By way of review the patient has had a prior L4-5 and L5-S1 radiofrequency ablation with good relief of her back pain.  This was done approximately a year and a half ago in July 2020.  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  Subsequently patient went on to have diagnostic sacroiliac joint injections performed with ablation of the sacroiliac joint on the right only.  She was having predominantly right buttock pain.  Ablation of the right sacroiliac joint did seem to help.  She had double blocks performed of the sacroiliac joint once by Dr. Prince Rome using ultrasound guidance and once by [place] with fluoroscopic guidance.  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  She chronically takes clonazepam as well as tramadol and Lyrica.  She reports medications seem not to help that much.  Despite continued conservative care and procedure she rates her pain as an 8 out of 10.  She also suffers from chronic COPD and very low body weight.     Review of Systems  Musculoskeletal:  Positive for back pain and joint pain.  All other systems reviewed and are negative. Otherwise per HPI.  Assessment  & Plan: Visit Diagnoses:    ICD-10-CM   1. Sacroiliitis (HCC)  M46.1 XR C-ARM NO REPORT    2. Spondylosis without myelopathy or radiculopathy, lumbar region  M47.816     3. Chronic bilateral low back pain without sciatica  M54.50    G89.29     4. Paresthesia of skin  R20.2     5. Chronic pain syndrome  G89.4        Plan: Findings:  Chronic severe recalcitrant low back and bilateral buttock pain with some referral in the hips and thighs but no real imaging findings that would correlate with any type of radicular type component.  She does have pain with standing and going from sit to stand consistent with facet arthritis.  She also has some pain consistent with sacroiliac joint type pain in the buttocks.  She has had radiofrequency ablation of both areas lumbar and sacroiliac joint.  Sacroiliac joint was only on the right.  Today to get a handle on diagnostic ability to perform the ablation we will complete sacroiliac joint injections to see if she gets relief.  If she gets a lot of relief with that then I would look at repeat right sacroiliac joint ablation.  We would need to complete a second block of the left at that helped at all.  If she does not get much relief I think she does need to have repeat ablations of the lumbar spine.  The ablations of the L4-5 and L5-S1 facet joints gave her over 60% relief of her axial back pain for more than a year.  Should continue with home exercise and current medication management.  All injections performed with fluoroscopic guidance as part of a comprehensive pain approach with access to orthopedic surgery as well as physical therapy and if needed behavioral health.  We would not take over medication management in this office.   Meds & Orders: No orders of the defined types were placed in this encounter.   Orders Placed This Encounter  Procedures   Sacroiliac Joint Inj   XR C-ARM NO REPORT    Follow-up: Return if symptoms worsen or fail to improve.    Procedures: Sacroiliac Joint Inj on 07/05/2021 3:30 PM Indications: pain and diagnostic evaluation Details: 22 G 3.5 in needle, fluoroscopy-guided posterior approach Medications (Right): 40 mg triamcinolone acetonide 40 MG/ML; 2 mL bupivacaine 0.5 % Medications (Left): 40 mg triamcinolone acetonide 40 MG/ML; 2 mL bupivacaine 0.5 % Outcome: tolerated well, no immediate complications  Sacroiliac Joint Intra-Articular Injection - Posterior Approach with Fluoroscopic Guidance   Position: PRONE  Additional Comments: Vital signs were monitored before and after the procedure. Patient was prepped and draped in the usual sterile fashion. The correct patient, procedure, and site was verified.   Injection Procedure Details:   Location/Site:  Sacroiliac joint  Needle size: 3.5 in Spinal Needle  Needle type: Spinal  Needle Placement: Intra-articular  Findings:  -Comments: There was excellent flow of contrast producing a partial arthrogram of the sacroiliac joint.   Procedure Details: Starting with a 90 degree vertical and midline orientation the fluoroscope was tilted cranially 20 to 25 degrees and the target area of the inferior most part of the SI joint on the side mentioned above was visualized.  The soft tissues overlying this target were infiltrated with 4 ml. of 1% Lidocaine without Epinephrine. A #22 gauge spinal needle was inserted perpendicular to the fluoroscope table and advanced into the posterior inferior joint space using fluoroscopic guidance.  Position in the joint space was confirmed by obtaining a partial arthrogram using a 2 ml. volume of Isovue-250 contrast agent. After negative aspirate for gross pus or blood, the injectate was delivered to the joint. Radiographs were obtained for documentation purposes.   Additional Comments:   Dressing: Bandaid    Post-procedure details: Patient was observed during the procedure. Post-procedure instructions were  reviewed.  Patient left the clinic in stable condition.    There was excellent flow of contrast producing a partial arthrogram of the sacroiliac joint.  Procedure, treatment alternatives, risks and benefits explained, specific risks discussed. Consent was given by the patient. Immediately prior to procedure a time out was called to verify the correct patient, procedure, equipment, support staff and site/side marked as required. Patient was prepped and draped in the usual sterile fashion.         Clinical History: MRI LUMBAR SPINE WITHOUT AND WITH CONTRAST   TECHNIQUE: Multiplanar and multiecho pulse sequences of the lumbar spine were obtained without and with intravenous contrast.   CONTRAST:  35mL MULTIHANCE GADOBENATE DIMEGLUMINE 529 MG/ML IV SOLN   COMPARISON:  Radiography 01/22/2018.  MRI 05/11/2017.   FINDINGS: Segmentation: 5 lumbar type vertebral bodies as numbered previously.   Alignment:  Normal except for 1 mm of anterolisthesis at L5-S1.   Vertebrae:  No fracture or primary bone lesion.   Conus medullaris and cauda equina: Conus extends to the L1 level. Conus and cauda equina  appear normal.   Paraspinal and other soft tissues: Negative   Disc levels:   T12-L1: Shallow left paracentral disc protrusion indents the thecal sac slightly but does not cause any neural compression. No change since the previous study.   L1-2 and L2-3: Normal disc spirit mild facet hypertrophy without edema or stenosis.   L3-4: Minimal bulging of the disc. Minimal facet hypertrophy. No stenosis.   L4-5: Mild bulging of the disc. Bilateral facet degeneration and hypertrophy. No compressive stenosis. Findings could contribute to back pain or referred facet syndrome pain.   L5-S1: 1 mm anterolisthesis. No disc abnormality. Bilateral facet degeneration worse on the left. No sign of neural compression. Findings could relate to back pain or referred facet syndrome pain.   Compared to  the study of 2018, the findings are quite similar.   IMPRESSION: No change since 2018. No evidence of stenosis or neural compression. Lower lumbar degenerative changes, most notably that of facet degenerative disease at L4-5 and L5-S1 which could contribute to back pain or referred facet syndrome pain. Single most abnormal facet joint is on the left at L5-S1.     Electronically Signed   By: Paulina FusiMark  Shogry M.D.   On: 08/10/2018 11:58   She reports that she has been smoking cigarettes. She has a 40.00 pack-year smoking history. She has never used smokeless tobacco. No results for input(s): HGBA1C, LABURIC in the last 8760 hours.  Objective:  VS:  HT:     WT:    BMI:      BP:    HR: bpm   TEMP: ( )   RESP:  Physical Exam Vitals and nursing note reviewed.  Constitutional:      General: She is not in acute distress.    Appearance: Normal appearance. She is not ill-appearing.     Comments: Very thin  HENT:     Head: Normocephalic and atraumatic.     Right Ear: External ear normal.     Left Ear: External ear normal.  Eyes:     Extraocular Movements: Extraocular movements intact.  Cardiovascular:     Rate and Rhythm: Normal rate.     Pulses: Normal pulses.  Pulmonary:     Effort: Pulmonary effort is normal. No respiratory distress.  Abdominal:     General: There is no distension.     Palpations: Abdomen is soft.  Musculoskeletal:        General: Tenderness present.     Cervical back: Neck supple.     Right lower leg: No edema.     Left lower leg: No edema.     Comments: Patient has good distal strength with no pain over the greater trochanters.  No clonus or focal weakness. Patient somewhat slow to rise from a seated position to full extension.  There is concordant low back pain with facet loading and lumbar spine extension rotation.  There are no definitive trigger points but the patient is somewhat tender across the lower back and PSIS.  She has a positive bilateral Fortin finger  sign.  Right more than left Patrick's testing positive.  Skin:    Findings: No erythema, lesion or rash.  Neurological:     General: No focal deficit present.     Mental Status: She is alert and oriented to person, place, and time.     Sensory: No sensory deficit.     Motor: No weakness or abnormal muscle tone.     Coordination: Coordination normal.  Psychiatric:  Mood and Affect: Mood normal.        Behavior: Behavior normal.    Ortho Exam  Imaging: No results found.  Past Medical/Family/Surgical/Social History: Medications & Allergies reviewed per EMR, new medications updated. Patient Active Problem List   Diagnosis Date Noted   Decreased activities of daily living (ADL) 05/02/2020   Oxygen dependent 05/02/2020   Stroke (cerebrum) (HCC) 10/16/2019   Centrilobular emphysema (HCC) 07/03/2018   Acute right-sided low back pain without sciatica 01/29/2018   Anxiety 04/16/2017   DDD (degenerative disc disease), lumbar 12/29/2016   Nocturnal hypoxemia 09/28/2016   Smoking greater than 40 pack years 09/28/2016   Tobacco use disorder 03/15/2015   UTI (lower urinary tract infection) 03/15/2015   COPD (chronic obstructive pulmonary disease) (HCC) 03/15/2015   Hypothyroidism 03/15/2015   Chronic pain syndrome 03/15/2015   Dehydration 03/15/2015   Elevated troponin 03/14/2015   Osteoarthritis of right knee 07/18/2013    Class: Chronic   Chronic hepatitis C (HCC) 11/02/2012   Constipation 11/02/2012   Loose body of right knee 07/16/2012    Class: Chronic   Past Medical History:  Diagnosis Date   Anxiety    Arthritis    Asthma    Back pain, chronic    Bone spur of other site    spine   Chronic back pain    spurs on spine   COPD (chronic obstructive pulmonary disease) (HCC)    denies SOB   Depression    takes Effexor daily   GERD (gastroesophageal reflux disease)    takes Omeprazole daily   History of bronchitis    last time about 6+yrs ago   History of colon  polyps    History of kidney stones    Hypothyroidism    takes Synthroid daily   Insomnia    takes Trazodone nightly and Ambien   Joint pain    Joint swelling    Nocturia    Pneumonia    hx of;last time about 6+yrs ago   PONV (postoperative nausea and vomiting)    Renal insufficiency    Sleep apnea    no CPAP; uses O2 at night 1l/min.   Urinary urgency    Family History  Problem Relation Age of Onset   Heart attack Mother    Stroke Mother    Diabetes Mellitus II Mother    Lymphoma Father    Past Surgical History:  Procedure Laterality Date   ABDOMINAL HYSTERECTOMY     complete   bladder tacked     COLONOSCOPY     ESOPHAGOGASTRODUODENOSCOPY     KNEE ARTHROPLASTY Right 07/18/2013   Procedure: COMPUTER ASSISTED RIGHT TOTAL KNEE ARTHROPLASTY;  Surgeon: Kerrin Champagne, MD;  Location: [facility];  Service: Orthopedics;  Laterality: Right;   KNEE ARTHROSCOPY Right 07/16/2012   Procedure: ARTHROSCOPY KNEE;  Surgeon: Kerrin Champagne, MD;  Location: [facility];  Service: Orthopedics;  Laterality: Right;  Right knee arthroscopy with chondroplastic shaving of retropatella surface   Social History   Occupational History   Not on file  Tobacco Use   Smoking status: Some Days    Packs/day: 1.00    Years: 40.00    Pack years: 40.00    Types: Cigarettes   Smokeless tobacco: Never  Vaping Use   Vaping Use: Some days  Substance and Sexual Activity   Alcohol use: No   Drug use: No   Sexual activity: Not on file

## 2021-07-20 ENCOUNTER — Other Ambulatory Visit: Payer: Self-pay | Admitting: Specialist

## 2021-07-25 ENCOUNTER — Encounter: Payer: Self-pay | Admitting: Specialist

## 2021-07-25 ENCOUNTER — Other Ambulatory Visit: Payer: Self-pay

## 2021-07-25 ENCOUNTER — Ambulatory Visit (INDEPENDENT_AMBULATORY_CARE_PROVIDER_SITE_OTHER): Payer: Medicare HMO | Admitting: Specialist

## 2021-07-25 VITALS — BP 152/93 | HR 61 | Ht 63.0 in | Wt 92.0 lb

## 2021-07-25 DIAGNOSIS — G8929 Other chronic pain: Secondary | ICD-10-CM

## 2021-07-25 DIAGNOSIS — M533 Sacrococcygeal disorders, not elsewhere classified: Secondary | ICD-10-CM | POA: Diagnosis not present

## 2021-07-25 DIAGNOSIS — M7052 Other bursitis of knee, left knee: Secondary | ICD-10-CM

## 2021-07-25 DIAGNOSIS — M4815 Ankylosing hyperostosis [Forestier], thoracolumbar region: Secondary | ICD-10-CM

## 2021-07-25 DIAGNOSIS — M7051 Other bursitis of knee, right knee: Secondary | ICD-10-CM

## 2021-07-25 NOTE — Progress Notes (Signed)
Office Visit Note   Patient: Patricia Sutton           Date of Birth: 04-10-1946           MRN: [id number] Visit Date: 07/25/2021              Requested by: No referring provider defined for this encounter. PCP: Patient, No Pcp Per (Inactive)   Assessment & Plan: Visit Diagnoses:  1. Chronic right SI joint pain   2. Chronic left SI joint pain   3. Forestier's disease of thoracolumbar region     Plan: Avoid bending, stooping and avoid lifting weights greater than 10 lbs. Avoid prolong standing and walking. Avoid frequent bending and stooping  No lifting greater than 10 lbs. May use ice or moist heat for pain. Weight loss is of benefit. Handicap license is approved. Dr. [place] Blas secretary/Assistant will call to arrange for evaluation for consideration of bilateral SI joint RFA.  Follow-Up Instructions: Return in about 6 weeks (around 09/05/2021).   Orders:  No orders of the defined types were placed in this encounter.  No orders of the defined types were placed in this encounter.     Procedures: No procedures performed   Clinical Data: No additional findings.   Subjective: Chief Complaint  Patient presents with   Lower Back - Follow-up    She had bilateral SI joint injections on 07/05/21 with Dr. Alvester Morin, she states that she did get 100% relief from the injections    HPI  Review of Systems   Objective: Vital Signs: BP (!) 152/93 (BP Location: Left Arm, Patient Position: Sitting)    Pulse 61    Ht 5\' 3"  (1.6 m)    Wt 92 lb (41.7 kg)    BMI 16.30 kg/m   Physical Exam  Ortho Exam  Specialty Comments:  No specialty comments available.  Imaging: No results found.   PMFS History: Patient Active Problem List   Diagnosis Date Noted   Osteoarthritis of right knee 07/18/2013    Priority: High    Class: Chronic   Loose body of right knee 07/16/2012    Priority: High    Class: Chronic   Decreased activities of daily living (ADL) 05/02/2020   Oxygen  dependent 05/02/2020   Stroke (cerebrum) (HCC) 10/16/2019   Centrilobular emphysema (HCC) 07/03/2018   Acute right-sided low back pain without sciatica 01/29/2018   Anxiety 04/16/2017   DDD (degenerative disc disease), lumbar 12/29/2016   Nocturnal hypoxemia 09/28/2016   Smoking greater than 40 pack years 09/28/2016   Tobacco use disorder 03/15/2015   UTI (lower urinary tract infection) 03/15/2015   COPD (chronic obstructive pulmonary disease) (HCC) 03/15/2015   Hypothyroidism 03/15/2015   Chronic pain syndrome 03/15/2015   Dehydration 03/15/2015   Elevated troponin 03/14/2015   Chronic hepatitis C (HCC) 11/02/2012   Constipation 11/02/2012   Past Medical History:  Diagnosis Date   Anxiety    Arthritis    Asthma    Back pain, chronic    Bone spur of other site    spine   Chronic back pain    spurs on spine   COPD (chronic obstructive pulmonary disease) (HCC)    denies SOB   Depression    takes Effexor daily   GERD (gastroesophageal reflux disease)    takes Omeprazole daily   History of bronchitis    last time about 6+yrs ago   History of colon polyps    History of kidney stones  Hypothyroidism    takes Synthroid daily   Insomnia    takes Trazodone nightly and Ambien   Joint pain    Joint swelling    Nocturia    Pneumonia    hx of;last time about 6+yrs ago   PONV (postoperative nausea and vomiting)    Renal insufficiency    Sleep apnea    no CPAP; uses O2 at night 1l/min.   Urinary urgency     Family History  Problem Relation Age of Onset   Heart attack Mother    Stroke Mother    Diabetes Mellitus II Mother    Lymphoma Father     Past Surgical History:  Procedure Laterality Date   ABDOMINAL HYSTERECTOMY     complete   bladder tacked     COLONOSCOPY     ESOPHAGOGASTRODUODENOSCOPY     KNEE ARTHROPLASTY Right 07/18/2013   Procedure: COMPUTER ASSISTED RIGHT TOTAL KNEE ARTHROPLASTY;  Surgeon: Kerrin Champagne, MD;  Location: [facility];  Service: Orthopedics;   Laterality: Right;   KNEE ARTHROSCOPY Right 07/16/2012   Procedure: ARTHROSCOPY KNEE;  Surgeon: Kerrin Champagne, MD;  Location: [facility];  Service: Orthopedics;  Laterality: Right;  Right knee arthroscopy with chondroplastic shaving of retropatella surface   Social History   Occupational History   Not on file  Tobacco Use   Smoking status: Some Days    Packs/day: 1.00    Years: 40.00    Pack years: 40.00    Types: Cigarettes   Smokeless tobacco: Never  Vaping Use   Vaping Use: Some days  Substance and Sexual Activity   Alcohol use: No   Drug use: No   Sexual activity: Not on file

## 2021-07-25 NOTE — Addendum Note (Signed)
Addended by: Basil Dess on: 07/25/2021 05:04 PM   Modules accepted: Orders

## 2021-07-25 NOTE — Patient Instructions (Signed)
Avoid bending, stooping and avoid lifting weights greater than 10 lbs. Avoid prolong standing and walking. Avoid frequent bending and stooping  No lifting greater than 10 lbs. May use ice or moist heat for pain. Weight loss is of benefit. Handicap license is approved. Dr. [place] Blas secretary/Assistant will call to arrange for evaluation for consideration of bilateral SI joint RFA.

## 2021-08-02 ENCOUNTER — Emergency Department (HOSPITAL_BASED_OUTPATIENT_CLINIC_OR_DEPARTMENT_OTHER)
Admission: EM | Admit: 2021-08-02 | Discharge: 2021-08-02 | Disposition: A | Discharge status: Home / Self Care | Payer: Medicare HMO | Attending: Emergency Medicine | Admitting: Emergency Medicine

## 2021-08-02 ENCOUNTER — Encounter (HOSPITAL_BASED_OUTPATIENT_CLINIC_OR_DEPARTMENT_OTHER): Payer: Self-pay | Admitting: *Deleted

## 2021-08-02 ENCOUNTER — Other Ambulatory Visit: Payer: Self-pay

## 2021-08-02 DIAGNOSIS — R300 Dysuria: Secondary | ICD-10-CM | POA: Diagnosis present

## 2021-08-02 DIAGNOSIS — Z8616 Personal history of COVID-19: Secondary | ICD-10-CM | POA: Diagnosis not present

## 2021-08-02 DIAGNOSIS — N3001 Acute cystitis with hematuria: Secondary | ICD-10-CM | POA: Diagnosis not present

## 2021-08-02 DIAGNOSIS — Z79899 Other long term (current) drug therapy: Secondary | ICD-10-CM | POA: Diagnosis not present

## 2021-08-02 DIAGNOSIS — Z7982 Long term (current) use of aspirin: Secondary | ICD-10-CM | POA: Insufficient documentation

## 2021-08-02 LAB — URINALYSIS, ROUTINE W REFLEX MICROSCOPIC
Bilirubin Urine: NEGATIVE
Glucose, UA: NEGATIVE mg/dL
Ketones, ur: NEGATIVE mg/dL
Nitrite: NEGATIVE
Protein, ur: NEGATIVE mg/dL
Specific Gravity, Urine: 1.015 (ref 1.005–1.030)
pH: 6 (ref 5.0–8.0)

## 2021-08-02 LAB — COMPREHENSIVE METABOLIC PANEL WITH GFR
ALT: 15 U/L (ref 0–44)
AST: 17 U/L (ref 15–41)
Albumin: 3.2 g/dL — ABNORMAL LOW (ref 3.5–5.0)
Alkaline Phosphatase: 80 U/L (ref 38–126)
Anion gap: 8 (ref 5–15)
BUN: 12 mg/dL (ref 8–23)
CO2: 30 mmol/L (ref 22–32)
Calcium: 8.7 mg/dL — ABNORMAL LOW (ref 8.9–10.3)
Chloride: 100 mmol/L (ref 98–111)
Creatinine, Ser: 0.74 mg/dL (ref 0.44–1.00)
GFR, Estimated: >60 mL/min
Glucose, Bld: 103 mg/dL — ABNORMAL HIGH (ref 70–99)
Potassium: 3.2 mmol/L — ABNORMAL LOW (ref 3.5–5.1)
Sodium: 138 mmol/L (ref 135–145)
Total Bilirubin: 0.5 mg/dL (ref 0.3–1.2)
Total Protein: 6.5 g/dL (ref 6.5–8.1)

## 2021-08-02 LAB — CBC WITH DIFFERENTIAL/PLATELET
Abs Immature Granulocytes: 0.04 K/uL (ref 0.00–0.07)
Basophils Absolute: 0 K/uL (ref 0.0–0.1)
Basophils Relative: 0 %
Eosinophils Absolute: 0.1 K/uL (ref 0.0–0.5)
Eosinophils Relative: 1 %
HCT: 41.7 % (ref 36.0–46.0)
Hemoglobin: 14.1 g/dL (ref 12.0–15.0)
Immature Granulocytes: 0 %
Lymphocytes Relative: 25 %
Lymphs Abs: 2.3 K/uL (ref 0.7–4.0)
MCH: 32.1 pg (ref 26.0–34.0)
MCHC: 33.8 g/dL (ref 30.0–36.0)
MCV: 95 fL (ref 80.0–100.0)
Monocytes Absolute: 1 K/uL (ref 0.1–1.0)
Monocytes Relative: 11 %
Neutro Abs: 5.9 K/uL (ref 1.7–7.7)
Neutrophils Relative %: 63 %
Platelets: 239 K/uL (ref 150–400)
RBC: 4.39 MIL/uL (ref 3.87–5.11)
RDW: 13.7 % (ref 11.5–15.5)
WBC: 9.3 K/uL (ref 4.0–10.5)
nRBC: 0 % (ref 0.0–0.2)

## 2021-08-02 LAB — LACTIC ACID, PLASMA: Lactic Acid, Venous: 1.1 mmol/L (ref 0.5–1.9)

## 2021-08-02 LAB — LIPASE, BLOOD: Lipase: 33 U/L (ref 11–51)

## 2021-08-02 LAB — URINALYSIS, MICROSCOPIC (REFLEX)

## 2021-08-02 MED ORDER — SODIUM CHLORIDE 0.9 % IV SOLN
1.0000 g | Freq: Once | INTRAVENOUS | Status: AC
  Administered 2021-08-02: 1 g via INTRAVENOUS
  Filled 2021-08-02: qty 10

## 2021-08-02 MED ORDER — POTASSIUM CHLORIDE CRYS ER 20 MEQ PO TBCR
40.0000 meq | EXTENDED_RELEASE_TABLET | Freq: Once | ORAL | Status: AC
  Administered 2021-08-02: 40 meq via ORAL
  Filled 2021-08-02: qty 2

## 2021-08-02 MED ORDER — LACTATED RINGERS IV BOLUS
500.0000 mL | Freq: Once | INTRAVENOUS | Status: AC
Start: 2021-08-02 — End: 2021-08-02
  Administered 2021-08-02: 500 mL via INTRAVENOUS

## 2021-08-02 MED ORDER — ONDANSETRON HCL 4 MG/2ML IJ SOLN
4.0000 mg | Freq: Once | INTRAMUSCULAR | Status: AC
  Administered 2021-08-02: 4 mg via INTRAVENOUS
  Filled 2021-08-02: qty 2

## 2021-08-02 MED ORDER — CEPHALEXIN 500 MG PO CAPS: 500.0000 mg | ORAL_CAPSULE | Freq: Four times a day (QID) | ORAL | 0 refills | Status: AC

## 2021-08-02 NOTE — ED Provider Notes (Signed)
[facility] EMERGENCY DEPARTMENT Provider Note   CSN: [id number] Arrival date & time: 08/02/21  1822     History  Chief Complaint  Patient presents with   Dysuria    Patricia Sutton is a 76 y.o. female.  HPI Patient is a 76 year old female with a history of UTIs, centrilobular emphysema, chronic hepatitis C, CVA, who presents to the emergency department due to dysuria.  Patient states that she began experiencing urinary frequency about 3 days ago.  Last night she reports associated nausea without vomiting as well as chills.  She measured her temperature on her forehead and states that her Tmax last night was 103 F.  She took Tylenol around 2 PM today which improved her symptoms mildly.  She notes that this afternoon she then began experiencing dysuria and her symptoms worsened once again so she came to the emergency department.  Denies any chest pain, shortness of breath.  She does note some mild tenderness in the upper abdomen but states that this has been present since last year when she had COVID-19.    Home Medications Prior to Admission medications   Medication Sig Start Date End Date Taking? Authorizing Provider  cephALEXin (KEFLEX) 500 MG capsule Take 1 capsule (500 mg total) by mouth 4 (four) times daily for 7 days. 08/02/21 08/09/21 Yes Placido Sou, PA-C  albuterol (PROVENTIL) (2.5 MG/3ML) 0.083% nebulizer solution Take 2.5 mg by nebulization every 4 (four) hours as needed for wheezing or shortness of breath.    [provider]  amoxicillin-clavulanate (AUGMENTIN) 875-125 MG tablet Take 1 tablet by mouth 2 (two) times daily. 04/11/20   [provider]  aspirin EC 81 MG tablet Take 1 tablet (81 mg total) by mouth daily. 03/16/15   Catarina Hartshorn, MD  atorvastatin (LIPITOR) 40 MG tablet Take by mouth. 10/18/19   [provider]  atorvastatin (LIPITOR) 40 MG tablet Take 40 mg by mouth at bedtime. 01/13/20   [provider]  celecoxib  (CELEBREX) 100 MG capsule Take 1 capsule (100 mg total) by mouth 2 (two) times daily. 06/19/19   Kerrin Champagne, MD  cetirizine (ZYRTEC) 10 MG tablet Take by mouth. 09/08/19 09/07/20  [provider]  clonazePAM (KLONOPIN) 0.5 MG tablet Take 0.5 mg by mouth at bedtime as needed. 04/28/20   [provider]  diclofenac Sodium (VOLTAREN) 1 % GEL APPLY 2 TO 4 GRAMS TOPICALLY TO THE AFFECTED AREA 2 TO 3 TIMES DAILY 09/14/20   Kerrin Champagne, MD  DOK 100 MG capsule Take 100 mg by mouth 2 (two) times daily. 03/27/19   [provider]  doxycycline (VIBRA-TABS) 100 MG tablet Take 100 mg by mouth 2 (two) times daily. 04/30/20   [provider]  ergocalciferol (VITAMIN D2) 1.25 MG (50000 UT) capsule Take by mouth. 03/13/19   [provider]  fluconazole (DIFLUCAN) 150 MG tablet Take by mouth. 04/28/20   [provider]  fluticasone (FLONASE) 50 MCG/ACT nasal spray SHAKE LIQUID AND USE 2 SPRAYS IN EACH NOSTRIL DAILY 09/08/19   [provider]  fluticasone (FLONASE) 50 MCG/ACT nasal spray Place 2 sprays into both nostrils daily. 01/12/20   [provider]  Fluticasone-Umeclidin-Vilant (TRELEGY ELLIPTA) 100-62.5-25 MCG/INH AEPB Inhale into the lungs. 02/17/19   [provider]  Fluticasone-Umeclidin-Vilant (TRELEGY ELLIPTA) 100-62.5-25 MCG/INH AEPB Inhale into the lungs. 02/17/19   [provider]  hydrOXYzine (ATARAX/VISTARIL) 25 MG tablet TAKE 1 TABLET(25 MG) BY MOUTH EVERY 8 HOURS AS NEEDED FOR ANXIETY  03/04/19   [provider]  levofloxacin (LEVAQUIN) 750 MG tablet Take 750 mg by mouth daily. 04/18/20   [provider]  levothyroxine (SYNTHROID, LEVOTHROID) 50 MCG tablet Take 1 tablet by mouth daily. 09/04/17   [provider]  loratadine (CLARITIN) 10 MG tablet Take 10 mg by mouth daily. 06/25/19   [provider]  Misc. Devices MISC Hypoxia resolved.   Discontinue oxygen 10/28/19   [provider]  omeprazole (PRILOSEC) 20 MG capsule Take 20 mg by mouth 2 (two) times daily.    [provider]  ondansetron (ZOFRAN ODT) 4 MG disintegrating tablet Take 1 tablet (4 mg total) by mouth every 8 (eight) hours as needed for nausea or vomiting. 09/26/17   Petrucelli, Samantha R, PA-C  pantoprazole (PROTONIX) 40 MG tablet  11/14/19   [provider]  pantoprazole (PROTONIX) 40 MG tablet Take 40 mg by mouth daily. 02/15/20   [provider]  predniSONE (DELTASONE) 20 MG tablet Take by mouth. 10/18/19   [provider]  predniSONE (STERAPRED UNI-PAK 21 TAB) 5 MG (21) TBPK tablet Take by mouth. 04/30/20   [provider]  pregabalin (LYRICA) 100 MG capsule Take 100 mg by mouth 2 (two) times daily. 03/16/20   [provider]  pregabalin (LYRICA) 100 MG capsule Take by mouth. 10/18/19   [provider]  pregabalin (LYRICA) 200 MG capsule  05/21/19   [provider]  promethazine (PHENERGAN) 25 MG tablet Take 25 mg by mouth 3 (three) times daily as needed for nausea or vomiting.    [provider]  promethazine (PHENERGAN) 25 MG tablet Take 1 tablet by mouth every 8 (eight) hours as needed. 12/31/19   [provider]  traMADol Janean Sark) 50 MG tablet Take 2 tablets by mouth every 6 hours prn severe pain 03/19/20   Kerrin Champagne, MD  traZODone (DESYREL) 100 MG tablet Take 100 mg by mouth at bedtime.     [provider]  traZODone (DESYREL) 100 MG tablet Take by mouth.    [provider]  VIIBRYD 20 MG TABS Take 1 tablet by mouth daily. 09/05/17   [provider]  VIIBRYD 40 MG TABS Take 40 mg by mouth daily. 02/10/20   [provider]  Vilazodone HCl (VIIBRYD) 40 MG TABS Take 1 tablet by mouth daily. 11/18/19   [provider]      Allergies    Patient has no known allergies.    Review of Systems   Review of Systems  All other systems reviewed and are negative. Ten  systems reviewed and are negative for acute change, except as noted in the HPI.   Physical Exam Updated Vital Signs BP 123/77 (BP Location: Right Arm)    Pulse 62    Temp 98.4 F (36.9 C) (Oral)    Resp (!) 22    Ht 5\' 3"  (1.6 m)    Wt 41.7 kg    SpO2 97%    BMI 16.29 kg/m  Physical Exam Vitals and nursing note reviewed.  Constitutional:      General: She is not in acute distress.    Appearance: Normal appearance. She is not ill-appearing, toxic-appearing or diaphoretic.  HENT:     Head: Normocephalic and atraumatic.     Right Ear: External ear normal.     Left Ear: External ear normal.     Nose: Nose normal.     Mouth/Throat:     Mouth: Mucous membranes are moist.  Pharynx: Oropharynx is clear. No oropharyngeal exudate or posterior oropharyngeal erythema.  Eyes:     Extraocular Movements: Extraocular movements intact.  Cardiovascular:     Rate and Rhythm: Normal rate and regular rhythm.     Pulses: Normal pulses.     Heart sounds: Normal heart sounds. No murmur heard.   No friction rub. No gallop.  Pulmonary:     Effort: Pulmonary effort is normal. No respiratory distress.     Breath sounds: Normal breath sounds. No stridor. No wheezing, rhonchi or rales.  Abdominal:     General: Abdomen is flat.     Palpations: Abdomen is soft.     Tenderness: There is abdominal tenderness.     Comments: Abdomen is flat and soft.  Mild TTP noted overlying the epigastrium.  Musculoskeletal:        General: Normal range of motion.     Cervical back: Normal range of motion and neck supple. No tenderness.     Right lower leg: No edema.     Left lower leg: No edema.     Comments: No pedal edema.  Skin:    General: Skin is warm and dry.  Neurological:     General: No focal deficit present.     Mental Status: She is alert and oriented to person, place, and time.  Psychiatric:        Mood and Affect: Mood normal.        Behavior: Behavior normal.   ED Results / Procedures / Treatments    Labs (all labs ordered are listed, but only abnormal results are displayed) Labs Reviewed  URINALYSIS, ROUTINE W REFLEX MICROSCOPIC - Abnormal; Notable for the following components:      Result Value   APPearance CLOUDY (*)    Hgb urine dipstick MODERATE (*)    Leukocytes,Ua MODERATE (*)    All other components within normal limits  COMPREHENSIVE METABOLIC PANEL - Abnormal; Notable for the following components:   Potassium 3.2 (*)    Glucose, Bld 103 (*)    Calcium 8.7 (*)    Albumin 3.2 (*)    All other components within normal limits  URINALYSIS, MICROSCOPIC (REFLEX) - Abnormal; Notable for the following components:   Bacteria, UA MANY (*)    All other components within normal limits  URINE CULTURE  CULTURE, BLOOD (ROUTINE X 2)  CBC WITH DIFFERENTIAL/PLATELET  LACTIC ACID, PLASMA  LIPASE, BLOOD  LACTIC ACID, PLASMA   EKG None  Radiology No results found.  Procedures Procedures   Medications Ordered in ED Medications  lactated ringers bolus 500 mL ( Intravenous Stopped 08/02/21 1947)  ondansetron (ZOFRAN) injection 4 mg (4 mg Intravenous Given 08/02/21 1949)  cefTRIAXone (ROCEPHIN) 1 g in sodium chloride 0.9 % 100 mL IVPB (0 g Intravenous Stopped 08/02/21 2031)  potassium chloride SA (KLOR-CON M) CR tablet 40 mEq (40 mEq Oral Given 08/02/21 2029)   ED Course/ Medical Decision Making/ A&P Clinical Course as of 08/02/21 2118  Tue Aug 02, 2021  1934 Glori LuisLeukocytes,UaMarland Kitchen(!): MODERATE [LJ]  1934 Bacteria, UA(!): MANY [LJ]  1934 WBC, UA: 21-50 [LJ]    Clinical Course User Index [LJ] Placido SouJoldersma, Seylah Wernert, PA-C                           Medical Decision Making Amount and/or Complexity of Data Reviewed Labs: ordered. Decision-making details documented in ED Course.  Risk Prescription drug management.  Pt is a 76 y.o. female with  a complex medical history who presents to the emergency department due to urinary frequency as well as dysuria.  Also notes fever last night.  Labs: CBC  without abnormalities. CMP with a potassium of 3.2, glucose of 103, calcium of 8.7, albumin of 3.2. Blood culture sent. Lipase of 33. Lactic acid within normal limits at 1.1. UA with moderate hemoglobin, moderate leukocytes, many bacteria, 21-50 white blood cells, 6-10 red blood cells. Urine culture sent.  I, Placido Sou, PA-C, personally reviewed and evaluated these images and lab results as part of my medical decision-making.  On my exam patient is afebrile.  Nontachycardic.  Nontoxic-appearing.  She was febrile last night and notes measuring it on her forehead.  She did have a dose of Tylenol earlier today as well.  Heart is regular rate and rhythm without murmurs, rubs, or gallops.  Lungs are clear to auscultation bilaterally.  Abdomen is flat and soft.  She does have mild tenderness appreciated along the epigastrium but states that this is chronic and not acutely worsened.  No lower abdominal pain.  Lab work has since resulted.  UA appears infectious.  Urine culture sent.  Given her fevers and chills last night I also obtained a blood culture which is pending.  Lipase within normal limits at 3.3.  Lactic acid within normal limits at 1.1.  CBC without leukocytosis.  Reassuring maps.  Does not appear to consistent with sepsis.  Patient's symptoms were treated with Zofran, IV fluids, Klor-Con, as well as 1 g of Rocephin.  Symptoms appear to be improving.  Still notes chills but denies other complaints.  Feel that the patient is stable for discharge at this time and she is agreeable.  Discharged on a course of Keflex.  Given very strict return precautions and she knows to return to the emergency department with any new or worsening symptoms.  Her questions were answered and she was amicable at the time of discharge.  Patient discussed with my attending physician Dr. Rolan Bucco who was in agreement with the above plan.  Note: Portions of this report may have been transcribed using voice  recognition software. Every effort was made to ensure accuracy; however, inadvertent computerized transcription errors may be present.   Final Clinical Impression(s) / ED Diagnoses Final diagnoses:  Acute cystitis with hematuria   Rx / DC Orders ED Discharge Orders          Ordered    cephALEXin (KEFLEX) 500 MG capsule  4 times daily        08/02/21 2106              Placido Sou, PA-C 08/02/21 2124    Rolan Bucco, MD 08/02/21 2316

## 2021-08-02 NOTE — ED Triage Notes (Signed)
Dysuria since this am. Body aches. Urinary frequency.  ?

## 2021-08-02 NOTE — Discharge Instructions (Addendum)
I am prescribing you an antibiotic called Keflex.  I want you to take this 4 times per day for the next 7 days.  Do not stop taking this early even if you feel your symptoms have resolved. ? ?If your symptoms persist or if you develop any new or worsening symptoms please come back to the emergency department immediately for reevaluation. ?

## 2021-08-04 LAB — URINE CULTURE

## 2021-08-05 ENCOUNTER — Ambulatory Visit: Payer: Medicare HMO | Admitting: Physical Medicine and Rehabilitation

## 2021-08-07 LAB — CULTURE, BLOOD (ROUTINE X 2)
Culture: NO GROWTH
Special Requests: ADEQUATE

## 2021-08-09 ENCOUNTER — Other Ambulatory Visit: Payer: Self-pay | Admitting: Specialist

## 2021-08-12 ENCOUNTER — Telehealth: Payer: Self-pay | Admitting: Physical Medicine and Rehabilitation

## 2021-08-12 NOTE — Telephone Encounter (Signed)
Pt would like to resch for Dr Ernestina Patches.. ? ?Please call the pt @ [phone] ?

## 2021-08-20 ENCOUNTER — Emergency Department (HOSPITAL_BASED_OUTPATIENT_CLINIC_OR_DEPARTMENT_OTHER)
Admission: EM | Admit: 2021-08-20 | Discharge: 2021-08-20 | Disposition: A | Discharge status: Home / Self Care | Payer: Medicare HMO | Attending: Emergency Medicine | Admitting: Emergency Medicine

## 2021-08-20 ENCOUNTER — Encounter (HOSPITAL_BASED_OUTPATIENT_CLINIC_OR_DEPARTMENT_OTHER): Payer: Self-pay

## 2021-08-20 ENCOUNTER — Other Ambulatory Visit: Payer: Self-pay

## 2021-08-20 ENCOUNTER — Emergency Department (HOSPITAL_BASED_OUTPATIENT_CLINIC_OR_DEPARTMENT_OTHER): Payer: Medicare HMO

## 2021-08-20 DIAGNOSIS — S0083XA Contusion of other part of head, initial encounter: Secondary | ICD-10-CM | POA: Insufficient documentation

## 2021-08-20 DIAGNOSIS — E039 Hypothyroidism, unspecified: Secondary | ICD-10-CM | POA: Diagnosis not present

## 2021-08-20 DIAGNOSIS — S299XXA Unspecified injury of thorax, initial encounter: Secondary | ICD-10-CM | POA: Diagnosis present

## 2021-08-20 DIAGNOSIS — J45909 Unspecified asthma, uncomplicated: Secondary | ICD-10-CM | POA: Diagnosis not present

## 2021-08-20 DIAGNOSIS — S20219A Contusion of unspecified front wall of thorax, initial encounter: Secondary | ICD-10-CM

## 2021-08-20 DIAGNOSIS — J449 Chronic obstructive pulmonary disease, unspecified: Secondary | ICD-10-CM | POA: Insufficient documentation

## 2021-08-20 DIAGNOSIS — W228XXA Striking against or struck by other objects, initial encounter: Secondary | ICD-10-CM | POA: Diagnosis not present

## 2021-08-20 DIAGNOSIS — S20221A Contusion of right back wall of thorax, initial encounter: Secondary | ICD-10-CM | POA: Diagnosis not present

## 2021-08-20 MED ORDER — TRAMADOL HCL 50 MG PO TABS
50.0000 mg | ORAL_TABLET | Freq: Once | ORAL | Status: AC
  Administered 2021-08-20: 50 mg via ORAL
  Filled 2021-08-20: qty 1

## 2021-08-20 MED ORDER — CYCLOBENZAPRINE HCL 5 MG PO TABS: 5.0000 mg | ORAL_TABLET | Freq: Three times a day (TID) | ORAL | 0 refills | Status: AC | PRN

## 2021-08-20 MED ORDER — CYCLOBENZAPRINE HCL 5 MG PO TABS
5.0000 mg | ORAL_TABLET | Freq: Once | ORAL | Status: AC
  Administered 2021-08-20: 5 mg via ORAL
  Filled 2021-08-20: qty 1

## 2021-08-20 NOTE — Discharge Instructions (Addendum)
You have some bruising in your chest and no fractures on x-ray ? ?Continue tramadol and take Flexeril for muscle relaxant ? ?See your doctor for follow up ? ?Return to ER if you have worse chest wall pain or facial pain or trouble breathing  ?

## 2021-08-20 NOTE — ED Provider Notes (Signed)
? ?Emergency Department Provider Note ? ? ?I have reviewed the triage vital signs and the nursing notes. ? ? ?HISTORY ? ?Chief Complaint ?Back Pain ? ? ?HPI ?Patricia Sutton is a 76 y.o. female with PMH of COPD, not on anticoagulation, presents to the emergency department for evaluation of right posterior chest pain and left cheek pain after an injury.  Days ago the patient was getting her [employer] decorations down from the attic.  She was up on a ladder and reaching in to try and pulled out a box.  The box shifted and struck her in the left cheek.  She was pushed backward against the wooden opening of the attic and pinned there for around 15 minutes.  She was ultimately able to get the box free and come down the ladder without falling.  She noticed soreness in her right posterior chest and back along with pain in her cheek.  No head injury, vision change, shortness of breath.  No abdominal pain or lower back pain.  No numbness or weakness.  She has chronic lower back pain and takes tramadol along with topical medications with no relief in symptoms. ? ? ?Past Medical History:  ?Diagnosis Date  ? Anxiety   ? Arthritis   ? Asthma   ? Back pain, chronic   ? Bone spur of other site   ? spine  ? Chronic back pain   ? spurs on spine  ? COPD (chronic obstructive pulmonary disease) ([place])   ? denies SOB  ? Depression   ? takes Effexor daily  ? GERD (gastroesophageal reflux disease)   ? takes Omeprazole daily  ? History of bronchitis   ? last time about 6+yrs ago  ? History of colon polyps   ? History of kidney stones   ? Hypothyroidism   ? takes Synthroid daily  ? Insomnia   ? takes Trazodone nightly and Ambien  ? Joint pain   ? Joint swelling   ? Nocturia   ? Pneumonia   ? hx of;last time about 6+yrs ago  ? PONV (postoperative nausea and vomiting)   ? Renal insufficiency   ? Sleep apnea   ? no CPAP; uses O2 at night 1l/min.  ? Urinary urgency   ? ? ?Review of Systems ? ?Constitutional: No fever/chills ?Eyes: No visual  changes. ?ENT: No sore throat. Positive left cheek pain.  ?Cardiovascular: Denies chest pain. ?Respiratory: Denies shortness of breath. ?Gastrointestinal: No abdominal pain.  No nausea, no vomiting.  No diarrhea.  No constipation. ?Genitourinary: Negative for dysuria. ?Musculoskeletal: Positive for mid back/posterior chest pain. ?Skin: Negative for rash. ?Neurological: Negative for headaches, focal weakness or numbness. ? ? ?____________________________________________ ? ? ?PHYSICAL EXAM: ? ?VITAL SIGNS: ?ED Triage Vitals  ?Enc Vitals Group  ?   BP 08/20/21 1425 (!) 153/71  ?   Pulse Rate 08/20/21 1425 (!) 59  ?   Resp 08/20/21 1425 18  ?   Temp 08/20/21 1425 98.6 ?F (37 ?C)  ?   Temp Source 08/20/21 1425 Oral  ?   SpO2 08/20/21 1425 98 %  ?   Weight 08/20/21 1426 91 lb 14.9 oz (41.7 kg)  ?   Height 08/20/21 1426 5\' 3"  (1.6 m)  ? ?Constitutional: Alert and oriented. Well appearing and in no acute distress. ?Eyes: Conjunctivae are normal. PERRL. EOMI. ?Head: Atraumatic. ?Nose: No congestion/rhinnorhea. ?Mouth/Throat: Mucous membranes are moist.   ?Neck: No stridor. No cervical spine tenderness to palpation. ?Cardiovascular: Normal rate, regular rhythm. Good  peripheral circulation. Grossly normal heart sounds.   ?Respiratory: Normal respiratory effort.  No retractions. Lungs CTAB. ?Gastrointestinal: Soft and nontender. No distention.  ?Musculoskeletal: No lower extremity tenderness nor edema. No gross deformities of extremities.  No midline thoracic or lumbar spine tenderness.  There is a linear bruise to the posterior chest wall without crepitus.  ?Neurologic:  Normal speech and language. No gross focal neurologic deficits are appreciated.  ?Skin:  Skin is warm and dry.  Linear bruise to the right posterior chest wall without crepitus.  No petechial rash.  No flank bruising.  ? ?____________________________________________ ? ? ?PROCEDURES ? ?Procedure(s) performed:  ? ?Procedures ? ?None   ?____________________________________________ ? ? ?INITIAL IMPRESSION / ASSESSMENT AND PLAN / ED COURSE ? ?Pertinent labs & imaging results that were available during my care of the patient were reviewed by me and considered in my medical decision making (see chart for details). ?  ?This patient is Presenting for Evaluation of trauma, which does require a range of treatment options, and is a complaint that involves a high risk of morbidity and mortality. ? ?The Differential Diagnoses include rib fracture, chest wall contusion, facial fracture, retroperitoneal bleeding. ? ?I decided to review pertinent External Data, and in summary patient is not anticoagulated. ? ?Radiologic Tests Ordered, included CT face and chest. Exams are pending at time of sign out. ? ?Social Determinants of Health Risk patient with a smoking history.  ? ?Medical Decision Making: Summary:  ?Patient presents emergency department evaluation of posterior chest wall pain with a faint linear bruise to the posterior chest.  There is no midline spine tenderness.  Mild tenderness to the left cheek without bruising.  No entrapment on extraocular movement.  No cervical spine tenderness.  No history of head trauma or external findings of trauma on exam. Plan for CT max/face and CT chest.  ? ?Care transferred to Dr. Darl Householder. ? ?Disposition: pending ? ?____________________________________________ ? ?FINAL CLINICAL IMPRESSION(S) / ED DIAGNOSES ? ?Final diagnoses:  ?Contusion of face, initial encounter  ?Contusion of chest wall, unspecified laterality, initial encounter  ? ? ? ?NEW OUTPATIENT MEDICATIONS STARTED DURING THIS VISIT: ? ?Discharge Medication List as of 08/20/2021  4:11 PM  ?  ? ?START taking these medications  ? Details  ?cyclobenzaprine (FLEXERIL) 5 MG tablet Take 1 tablet (5 mg total) by mouth 3 (three) times daily as needed., Starting Sat 08/20/2021, Print  ?  ?  ? ? ?Note:  This document was prepared using Dragon voice recognition software and  may include unintentional dictation errors. ? ?Nanda Quinton, MD, FACEP ?Emergency Medicine ? ?  ?Margette Fast, MD ?08/21/21 [phone] ? ?

## 2021-08-20 NOTE — ED Triage Notes (Signed)
Pt was getting bin from attic Thursday when it got pushed against her left cheek and pinned her back against the attic opening. C/o mid back pain and soreness to left cheek. Denies fall. Bruising noted to back. Denies thinners. ?

## 2021-08-20 NOTE — ED Provider Notes (Signed)
?  Physical Exam  ?BP (!) 153/71 (BP Location: Right Arm)   Pulse (!) 59   Temp 98.6 ?F (37 ?C) (Oral)   Resp 18   Ht 5\' 3"  (1.6 m)   Wt 41.7 kg   SpO2 98%   BMI 16.29 kg/m?  ? ?Physical Exam ? ?Procedures  ?Procedures ? ?ED Course / MDM  ?  ?Medical Decision Making ?Care assumed at 3 pm.  Had a fall and chest injury and facial injury.  Signout pending CT chest and CT maxillofacial.  Patient was given tramadol. ? ?4:09 PM ?CT showed no fracture or pneumothorax or facial fractures.  Patient has tramadol at home we will add Flexeril as needed.  Stable for discharge ? ?Problems Addressed: ?Contusion of chest wall, unspecified laterality, initial encounter: acute illness or injury ?Contusion of face, initial encounter: acute illness or injury ? ?Amount and/or Complexity of Data Reviewed ?Radiology: ordered and independent interpretation performed. Decision-making details documented in ED Course. ? ?Risk ?Prescription drug management. ? ? ? ? ? ? ? ?  ? , MD ?08/20/21 1610 ? ?

## 2021-08-20 NOTE — ED Notes (Signed)
ED Provider at bedside. 

## 2021-08-22 ENCOUNTER — Ambulatory Visit: Payer: Medicare HMO | Admitting: Physical Medicine and Rehabilitation

## 2021-08-23 ENCOUNTER — Ambulatory Visit: Payer: Medicare HMO | Admitting: Physical Therapy

## 2021-08-25 ENCOUNTER — Ambulatory Visit: Payer: Medicare HMO | Admitting: Physical Therapy

## 2021-08-29 ENCOUNTER — Other Ambulatory Visit: Payer: Self-pay | Admitting: Specialist

## 2021-08-30 ENCOUNTER — Ambulatory Visit: Payer: Medicare HMO | Admitting: Physical Medicine and Rehabilitation

## 2021-08-31 ENCOUNTER — Encounter: Payer: Self-pay | Admitting: Physical Medicine and Rehabilitation

## 2021-08-31 ENCOUNTER — Ambulatory Visit (INDEPENDENT_AMBULATORY_CARE_PROVIDER_SITE_OTHER): Payer: Medicare HMO | Admitting: Physical Medicine and Rehabilitation

## 2021-08-31 DIAGNOSIS — M47816 Spondylosis without myelopathy or radiculopathy, lumbar region: Secondary | ICD-10-CM | POA: Diagnosis not present

## 2021-08-31 DIAGNOSIS — G894 Chronic pain syndrome: Secondary | ICD-10-CM | POA: Diagnosis not present

## 2021-08-31 DIAGNOSIS — G8929 Other chronic pain: Secondary | ICD-10-CM | POA: Diagnosis not present

## 2021-08-31 DIAGNOSIS — M545 Low back pain, unspecified: Secondary | ICD-10-CM

## 2021-08-31 NOTE — Progress Notes (Signed)
? ?YAELIS PEPI - 76 y.o. female MRN [id number]  Date of birth: [birth year] ? ?Office Visit Note: ?Visit Date: 08/31/2021 ?PCP: Francesca Oman, DO ?Referred by: Jessy Oto, MD ? ?Subjective: ?Chief Complaint  ?Patient presents with  ? Lower Back - Pain  ? ?HPI: NASHELY BEATH is a 76 y.o. female who comes in today For evaluation of chronic, worsening and severe bilateral lower back pain radiating to bilateral anterior thigh regions.  Patient is somewhat of a poor historian and has difficulty answering questions.  Patient reports pain has been ongoing for several years and is exacerbated by prolonged sitting, bending and standing.  Patient also reports significant pain when moving from a sitting to standing position.  Patient describes her pain as a constant throbbing and aching sensation, currently rates as 8 out of 10.  Patient reports some relief of pain with home exercise regimen, rest and use of medications. Patient states she has attended formal physical therapy in the past with little relief of pain.  Patient is currently taking tramadol as needed for moderate to severe pain, most recently prescribed by Dr. Basil Dess.  Patient's lumbar MRI from 2020 exhibits multilevel facet hypertrophy, most prominent at the levels of L4-L5 and L5-S1.  No high-grade spinal canal stenosis noted.  Patient is currently being managed by Dr. Basil Dess from an orthopedic spine standpoint. Patient has had multiple radiofrequency ablation procedures performed in our office, most recent bilateral L4-L5 and L5-S1 in 2020 which she reports gave her significant pain relief, greater than 80% for over a year.  Patient states her pain is currently identical to the discomfort she experienced prior to having radiofrequency ablation. Patient has also undergone bilateral sacroiliac joint injections and radiofrequency of right sacroiliac joint lateral branches in our office, she reports some relief of pain with these procedures.  Patient  does report recent accident, states that she was attempting to get [employer] out of her attic when a large box fell and she became pinned between the ladder and the wall.  Patient was evaluated in the emergency department, CT maxillofacial and CT chest were both negative, she was discharged home.  Patient denies focal weakness, numbness and tingling.  Patient denies bowel/bladder incontinence. ? ?Review of Systems  ?Musculoskeletal:  Positive for back pain.  ?Neurological:  Negative for tingling, sensory change, focal weakness and weakness.  ?All other systems reviewed and are negative. Otherwise per HPI. ? ?Assessment & Plan: ?Visit Diagnoses:  ?  ICD-10-CM   ?1. Chronic bilateral low back pain without sciatica  M54.50 Ambulatory referral to Physical Medicine Rehab  ? G89.29   ?  ?2. Spondylosis without myelopathy or radiculopathy, lumbar region  M47.816 Ambulatory referral to Physical Medicine Rehab  ?  ?3. Facet hypertrophy of lumbar region  M47.816 Ambulatory referral to Physical Medicine Rehab  ?  ?4. Chronic pain syndrome  G89.4 Ambulatory referral to Physical Medicine Rehab  ?  ?   ?Plan: Findings:  ?Chronic, worsening and severe bilateral lower back pain radiating to bilateral anterior thigh regions.  No radicular symptoms noted today.  Patient continues to have severe pain despite good conservative therapy such as formal physical therapy, home exercise regimen, and rest and use of medications.  Patient's clinical presentation and exam are consistent with facet mediated pain.  She does have significant pain with lumbar extension upon exam today.  We believe the next step is to repeat bilateral L4-L5 and L5-S1 radiofrequency ablation procedure under fluoroscopic guidance.  Patient did get greater then 80% pain relief from previous radiofrequency ablation procedure in 2020, her pain relief lasted for over a year.  Patient encouraged to continue home exercise regimen as tolerated.  No red flag symptoms  noted upon exam today. ? ?Prior radiofrequency ablation procedure did provide greater than 80% relief as noted above and  did give the patient more functional ability for activities of daily living and was also beneficial in that it did reducing her medication requirement. Patient has had physical therapy and continues with directed home exercise program. Current medication management is not beneficial in increasing their functional status.  Please note that procedures are done as part of a comprehensive orthopedic and pain management program with access to in-house orthopedics, spine surgery and physical therapy as well as access to [facility] if needed. ?   ? ?Meds & Orders: No orders of the defined types were placed in this encounter. ?  ?Orders Placed This Encounter  ?Procedures  ? Ambulatory referral to Physical Medicine Rehab  ?  ?Follow-up: Return for Bilateral L4-L5 and L5-S1 radiofrequency ablation.  ? ?Procedures: ?No procedures performed  ?   ? ?Clinical History: ?MRI LUMBAR SPINE WITHOUT AND WITH CONTRAST  ?   ?TECHNIQUE:  ?Multiplanar and multiecho pulse sequences of the lumbar spine were  ?obtained without and with intravenous contrast.  ?   ?CONTRAST:  3mL MULTIHANCE GADOBENATE DIMEGLUMINE 529 MG/ML IV SOLN  ?   ?COMPARISON:  Radiography 01/22/2018.  MRI 05/11/2017.  ?   ?FINDINGS:  ?Segmentation: 5 lumbar type vertebral bodies as numbered previously.  ?   ?Alignment:  Normal except for 1 mm of anterolisthesis at L5-S1.  ?   ?Vertebrae:  No fracture or primary bone lesion.  ?   ?Conus medullaris and cauda equina: Conus extends to the L1 level.  ?Conus and cauda equina appear normal.  ?   ?Paraspinal and other soft tissues: Negative  ?   ?Disc levels:  ?   ?T12-L1: Shallow left paracentral disc protrusion indents the thecal  ?sac slightly but does not cause any neural compression. No change  ?since the previous study.  ?   ?L1-2 and L2-3: Normal disc spirit  mild facet hypertrophy without  ?edema or stenosis.  ?   ?L3-4: Minimal bulging of the disc. Minimal facet hypertrophy. No  ?stenosis.  ?   ?L4-5: Mild bulging of the disc. Bilateral facet degeneration and  ?hypertrophy. No compressive stenosis. Findings could contribute to  ?back pain or referred facet syndrome pain.  ?   ?L5-S1: 1 mm anterolisthesis. No disc abnormality. Bilateral facet  ?degeneration worse on the left. No sign of neural compression.  ?Findings could relate to back pain or referred facet syndrome pain.  ?   ?Compared to the study of 2018, the findings are quite similar.  ?   ?IMPRESSION:  ?No change since 2018. No evidence of stenosis or neural compression.  ?Lower lumbar degenerative changes, most notably that of facet  ?degenerative disease at L4-5 and L5-S1 which could contribute to  ?back pain or referred facet syndrome pain. Single most abnormal  ?facet joint is on the left at L5-S1.  ?   ?   ?Electronically Signed  ?  By: Nelson Chimes M.D.  ?  On: 08/10/2018 11:58  ? ?She reports that she has been smoking cigarettes. She has a 40.00 pack-year smoking history. She has never used smokeless tobacco. No results for input(s): HGBA1C, LABURIC in the last  8760 hours. ? ?Objective:  VS:  HT:    WT:   BMI:     BP:   HR: bpm  TEMP: ( )  RESP:  ?Physical Exam ?Vitals and nursing note reviewed.  ?HENT:  ?   Head: Normocephalic and atraumatic.  ?   Right Ear: External ear normal.  ?   Left Ear: External ear normal.  ?   Nose: Nose normal.  ?   Mouth/Throat:  ?   Mouth: Mucous membranes are moist.  ?Eyes:  ?   Extraocular Movements: Extraocular movements intact.  ?Cardiovascular:  ?   Rate and Rhythm: Normal rate.  ?   Pulses: Normal pulses.  ?Pulmonary:  ?   Effort: Pulmonary effort is normal.  ?Abdominal:  ?   General: Abdomen is flat. There is no distension.  ?Musculoskeletal:     ?   General: Tenderness present.  ?   Cervical back: Normal range of motion.  ?   Comments: Pt is slow to rise from  seated position to standing without difficulty. Concordant low back pain with facet loading, lumbar spine extension and rotation. Strong distal strength without clonus, no pain upon palpation of greater troch

## 2021-08-31 NOTE — Progress Notes (Signed)
Pt state lower back pain that travels to both legs. Pt state walking, sitting and standing makes the pain worse. Pt state uses heat/ ice and pain meds to help ease her pain. ? ?Numeric Pain Rating Scale and Functional Assessment ?Average Pain 10 ?Pain Right Now 10 ?My pain is constant, burning, dull, stabbing, tingling, and aching ?Pain is worse with: walking, sitting, standing, and some activites ?Pain improves with: heat/ice, medication, and injections ? ? ?In the last MONTH (on 0-10 scale) has pain interfered with the following? ? ?1. General activity like being  able to carry out your everyday physical activities such as walking, climbing stairs, carrying groceries, or moving a chair?  ?Rating(5) ? ?2. Relation with others like being able to carry out your usual social activities and roles such as  activities at home, at work and in your community. ?Rating(6) ? ?3. Enjoyment of life such that you have  been bothered by emotional problems such as feeling anxious, depressed or irritable?  ?Rating(7) ? ?

## 2021-09-01 ENCOUNTER — Encounter: Payer: Medicare HMO | Admitting: Physical Therapy

## 2021-09-05 ENCOUNTER — Ambulatory Visit: Payer: Medicare HMO | Admitting: Specialist

## 2021-09-06 ENCOUNTER — Encounter: Payer: Medicare HMO | Admitting: Physical Therapy

## 2021-09-08 ENCOUNTER — Ambulatory Visit (INDEPENDENT_AMBULATORY_CARE_PROVIDER_SITE_OTHER): Payer: Medicare HMO | Admitting: Specialist

## 2021-09-08 ENCOUNTER — Encounter: Payer: Self-pay | Admitting: Specialist

## 2021-09-08 VITALS — BP 133/76 | HR 55

## 2021-09-08 DIAGNOSIS — M533 Sacrococcygeal disorders, not elsewhere classified: Secondary | ICD-10-CM | POA: Diagnosis not present

## 2021-09-08 DIAGNOSIS — M7051 Other bursitis of knee, right knee: Secondary | ICD-10-CM | POA: Diagnosis not present

## 2021-09-08 DIAGNOSIS — M4815 Ankylosing hyperostosis [Forestier], thoracolumbar region: Secondary | ICD-10-CM

## 2021-09-08 DIAGNOSIS — G8929 Other chronic pain: Secondary | ICD-10-CM

## 2021-09-08 DIAGNOSIS — M7052 Other bursitis of knee, left knee: Secondary | ICD-10-CM

## 2021-09-08 DIAGNOSIS — M47816 Spondylosis without myelopathy or radiculopathy, lumbar region: Secondary | ICD-10-CM

## 2021-09-08 MED ORDER — LIDOCAINE HCL 1 % IJ SOLN
1.0000 mL | INTRAMUSCULAR | Status: AC | PRN
  Administered 2021-09-08: 1 mL

## 2021-09-08 MED ORDER — BUPIVACAINE HCL 0.5 % IJ SOLN
2.0000 mL | INTRAMUSCULAR | Status: AC | PRN
  Administered 2021-09-08: 2 mL via PERITENDINOUS

## 2021-09-08 NOTE — Progress Notes (Signed)
? ?Office Visit Note ?  ?Patient: Patricia Sutton           ?Date of Birth: 03/20/1946           ?MRN: [id number] ?Visit Date: 09/08/2021 ?             ?Requested by: Patricia Sutton, Patricia Sutton ?[phone] ?[address] ?[place],  [place] [id number] ?PCP: Patricia Sutton, Patricia Sutton ? ? ?Assessment & Plan: ?Visit Diagnoses:  ?1. Chronic right SI joint pain   ?2. Spondylosis without myelopathy or radiculopathy, lumbar region   ?3. Forestier's disease of thoracolumbar region   ?4. Pes anserinus bursitis of both knees   ?5. Pes anserinus bursitis of right knee   ? ? ?Plan:Avoid bending, stooping and avoid lifting weights greater than 10 lbs. ?Avoid prolong standing and walking. ?Avoid frequent bending and stooping  ?No lifting greater than 10 lbs. ?May use ice or moist heat for pain. ?Weight loss is of benefit. ?Handicap license is approved. ?Dr. Mosby Sutton's secretary/Assistant will call to arrange for evaluation for consideration of bilateral SI joint RFA. ?  ? ?Follow-Up Instructions: No follow-ups on file.  ? ?Orders:  ?Orders Placed This Encounter  ?Procedures  ? Medium Joint Inj  ? ?No orders of the defined types were placed in this encounter. ? ? ? ? Procedures: ?Medium Joint Inj (Bilateral knee) on 09/08/2021 4:29 PM ?Details: 25 G 1.5 in needle ?Medications: 1 mL lidocaine 1 %; 2 mL bupivacaine 0.5 % ? ?Bilateral pes anserina steroid and marcaine and lidocaine injection.  ? ? ? ?Clinical Data: ?No additional findings. ? ? ?Subjective: ?No chief complaint on file. ? ? ?76 year old female with history of mild lumbar spinal stenosis, she is having chronic pain associated with her lumbar spine and did well with previous RFA right SI joint 2019. She reports that previous RFA relieved pain for nearly 2. She is having pain into the legs from the knee down with greater than 30 min of standing and walking. When grocery shopping she can only tolerate 30 min of stand and has to go home and sit down. Steriod injections of the knees has been  helpful for bilateral knee pain left greater than right. Previous right TKR without swelling.  ? ? ?Review of Systems  ?Constitutional: Negative.   ?HENT: Negative.    ?Eyes: Negative.   ?Respiratory: Negative.    ?Cardiovascular: Negative.   ?Gastrointestinal: Negative.   ?Endocrine: Negative.   ?Genitourinary: Negative.   ?Musculoskeletal: Negative.   ?Skin: Negative.   ?Allergic/Immunologic: Negative.   ?Neurological: Negative.   ?Hematological: Negative.   ?Psychiatric/Behavioral: Negative.    ? ? ?Objective: ?Vital Signs: BP 133/76 (BP Location: Left Arm, Patient Position: Sitting, Cuff Size: Normal)   Pulse (!) 55  ? ?Physical Exam ?Constitutional:   ?   Appearance: She is well-developed.  ?HENT:  ?   Head: Normocephalic and atraumatic.  ?Eyes:  ?   Pupils: Pupils are equal, round, and reactive to light.  ?Pulmonary:  ?   Effort: Pulmonary effort is normal.  ?   Breath sounds: Normal breath sounds.  ?Abdominal:  ?   General: Bowel sounds are normal.  ?   Palpations: Abdomen is soft.  ?Musculoskeletal:     ?   General: Normal range of motion.  ?   Cervical back: Normal range of motion and neck supple.  ?   Right knee:  ?   Instability Tests: Medial McMurray test negative and lateral McMurray test  negative.  ?   Left knee:  ?   Instability Tests: Medial McMurray test negative and lateral McMurray test negative.  ?Skin: ?   General: Skin is warm and dry.  ?Neurological:  ?   Mental Status: She is alert and oriented to person, place, and time.  ?Psychiatric:     ?   Behavior: Behavior normal.     ?   Thought Content: Thought content normal.     ?   Judgment: Judgment normal.  ? ?Right Knee Exam  ? ?Tenderness  ?The patient is experiencing tenderness in the medial joint line and pes anserinus. ? ?Range of Motion  ?Extension:  normal  ?Flexion:  normal  ? ?Tests  ?McMurray:  Medial - negative Lateral - negative ?Varus: negative Valgus: negative ?Drawer:  Anterior - negative     ?Patellar apprehension:  negative ? ?Other  ?Scars: present ?Pulse: present ?Swelling: none ? ?Comments:  Pes is tender left and right side ? ? ?Left Knee Exam  ? ?Tenderness  ?The patient is experiencing tenderness in the medial joint line and pes anserinus. ? ?Range of Motion  ?Extension:  normal  ?Flexion:  normal  ? ?Tests  ?McMurray:  Medial - negative Lateral - negative ?Varus: negative Valgus: negative ?Drawer:  Anterior - negative     Posterior - negative ?Patellar apprehension: negative ? ?Other  ?Scars: absent ?Pulse: present ?Swelling: none ? ?Comments:  Pes is tender left and right side ? ? ? ?Specialty Comments:  ?MRI LUMBAR SPINE WITHOUT AND WITH CONTRAST  ?   ?TECHNIQUE:  ?Multiplanar and multiecho pulse sequences of the lumbar spine were  ?obtained without and with intravenous contrast.  ?   ?CONTRAST:  49mL MULTIHANCE GADOBENATE DIMEGLUMINE 529 MG/ML IV SOLN  ?   ?COMPARISON:  Radiography 01/22/2018.  MRI 05/11/2017.  ?   ?FINDINGS:  ?Segmentation: 5 lumbar type vertebral bodies as numbered previously.  ?   ?Alignment:  Normal except for 1 mm of anterolisthesis at L5-S1.  ?   ?Vertebrae:  No fracture or primary bone lesion.  ?   ?Conus medullaris and cauda equina: Conus extends to the L1 level.  ?Conus and cauda equina appear normal.  ?   ?Paraspinal and other soft tissues: Negative  ?   ?Disc levels:  ?   ?T12-L1: Shallow left paracentral disc protrusion indents the thecal  ?sac slightly but does not cause any neural compression. No change  ?since the previous study.  ?   ?L1-2 and L2-3: Normal disc spirit mild facet hypertrophy without  ?edema or stenosis.  ?   ?L3-4: Minimal bulging of the disc. Minimal facet hypertrophy. No  ?stenosis.  ?   ?L4-5: Mild bulging of the disc. Bilateral facet degeneration and  ?hypertrophy. No compressive stenosis. Findings could contribute to  ?back pain or referred facet syndrome pain.  ?   ?L5-S1: 1 mm anterolisthesis. No disc abnormality. Bilateral facet  ?degeneration worse on the left.  No sign of neural compression.  ?Findings could relate to back pain or referred facet syndrome pain.  ?   ?Compared to the study of 2018, the findings are quite similar.  ?   ?IMPRESSION:  ?No change since 2018. No evidence of stenosis or neural compression.  ?Lower lumbar degenerative changes, most notably that of facet  ?degenerative disease at L4-5 and L5-S1 which could contribute to  ?back pain or referred facet syndrome pain. Single most abnormal  ?facet joint is on the left at L5-S1.  ?   ?   ?  Electronically Signed  ?  By: Paulina Fusi M.D.  ?  On: 08/10/2018 11:58 ? ?Imaging: ?No results found. ? ? ?PMFS History: ?Patient Active Problem List  ? Diagnosis Date Noted  ? Osteoarthritis of right knee 07/18/2013  ?  Priority: High  ?  Class: Chronic  ? Loose body of right knee 07/16/2012  ?  Priority: High  ?  Class: Chronic  ? Decreased activities of daily living (ADL) 05/02/2020  ? Oxygen dependent 05/02/2020  ? Stroke (cerebrum) (HCC) 10/16/2019  ? Centrilobular emphysema (HCC) 07/03/2018  ? Acute right-sided low back pain without sciatica 01/29/2018  ? Anxiety 04/16/2017  ? DDD (degenerative disc disease), lumbar 12/29/2016  ? Nocturnal hypoxemia 09/28/2016  ? Smoking greater than 40 pack years 09/28/2016  ? Tobacco use disorder 03/15/2015  ? UTI (lower urinary tract infection) 03/15/2015  ? COPD (chronic obstructive pulmonary disease) (HCC) 03/15/2015  ? Hypothyroidism 03/15/2015  ? Chronic pain syndrome 03/15/2015  ? Dehydration 03/15/2015  ? Elevated troponin 03/14/2015  ? Chronic hepatitis C (HCC) 11/02/2012  ? Constipation 11/02/2012  ? ?Past Medical History:  ?Diagnosis Date  ? Anxiety   ? Arthritis   ? Asthma   ? Back pain, chronic   ? Bone spur of other site   ? spine  ? Chronic back pain   ? spurs on spine  ? COPD (chronic obstructive pulmonary disease) (HCC)   ? denies SOB  ? Depression   ? takes Effexor daily  ? GERD (gastroesophageal reflux disease)   ? takes Omeprazole daily  ? History of  bronchitis   ? last time about 6+yrs ago  ? History of colon polyps   ? History of kidney stones   ? Hypothyroidism   ? takes Synthroid daily  ? Insomnia   ? takes Trazodone nightly and Ambien  ? Joint pain   ? Joint swelli

## 2021-09-08 NOTE — Patient Instructions (Signed)
Plan: Avoid bending, stooping and avoid lifting weights greater than 10 lbs. ?Avoid prolong standing and walking. ?Avoid frequent bending and stooping  ?No lifting greater than 10 lbs. ?May use ice or moist heat for pain. ?Weight loss is of benefit. ?Handicap license is approved. ?Dr. Willard Blas secretary/Assistant will call to arrange for evaluation for consideration of bilateral SI joint RFA. ?

## 2021-09-09 ENCOUNTER — Telehealth: Payer: Self-pay | Admitting: Specialist

## 2021-09-09 ENCOUNTER — Telehealth: Payer: Self-pay | Admitting: Physical Medicine and Rehabilitation

## 2021-09-09 NOTE — Telephone Encounter (Signed)
Peer to peer with [employer] this morning was approved for bilateral L4-L5 and L5-S1 radiofrequency ablation procedure.  ?

## 2021-09-09 NOTE — Telephone Encounter (Signed)
Please call the pt to advise what injection was performed  ?

## 2021-09-12 NOTE — Telephone Encounter (Signed)
Attempted to contact patient however had to leave a voicemail. Office number was provided for a returned call ?

## 2021-09-13 ENCOUNTER — Ambulatory Visit: Payer: Medicare HMO | Admitting: Physical Therapy

## 2021-09-13 ENCOUNTER — Telehealth: Payer: Self-pay | Admitting: Specialist

## 2021-09-13 ENCOUNTER — Other Ambulatory Visit: Payer: Self-pay | Admitting: Specialist

## 2021-09-13 NOTE — Telephone Encounter (Signed)
error 

## 2021-09-20 ENCOUNTER — Encounter: Payer: Medicare HMO | Admitting: Physical Therapy

## 2021-09-22 ENCOUNTER — Ambulatory Visit
Admission: RE | Admit: 2021-09-22 | Discharge: 2021-09-22 | Disposition: A | Discharge status: Home / Self Care | Payer: Medicare HMO | Source: Ambulatory Visit | Attending: Specialist | Admitting: Specialist

## 2021-09-22 DIAGNOSIS — G8929 Other chronic pain: Secondary | ICD-10-CM

## 2021-09-22 DIAGNOSIS — M47816 Spondylosis without myelopathy or radiculopathy, lumbar region: Secondary | ICD-10-CM

## 2021-09-26 ENCOUNTER — Telehealth: Payer: Self-pay | Admitting: Specialist

## 2021-09-26 NOTE — Telephone Encounter (Signed)
Pt ius calling begging for an appt today --she is in [place] ---Please call  ?

## 2021-09-26 NOTE — Telephone Encounter (Signed)
Pt calling again.

## 2021-09-28 NOTE — Telephone Encounter (Signed)
10/03/21 with Louanne Skye ? ?

## 2021-10-03 ENCOUNTER — Ambulatory Visit (INDEPENDENT_AMBULATORY_CARE_PROVIDER_SITE_OTHER): Payer: Medicare HMO | Admitting: Specialist

## 2021-10-03 ENCOUNTER — Encounter: Payer: Self-pay | Admitting: Specialist

## 2021-10-03 VITALS — BP 127/61 | HR 70 | Ht 63.0 in | Wt 100.0 lb

## 2021-10-03 DIAGNOSIS — M48062 Spinal stenosis, lumbar region with neurogenic claudication: Secondary | ICD-10-CM | POA: Diagnosis not present

## 2021-10-03 DIAGNOSIS — R202 Paresthesia of skin: Secondary | ICD-10-CM | POA: Diagnosis not present

## 2021-10-03 DIAGNOSIS — M4807 Spinal stenosis, lumbosacral region: Secondary | ICD-10-CM | POA: Diagnosis not present

## 2021-10-03 DIAGNOSIS — M5416 Radiculopathy, lumbar region: Secondary | ICD-10-CM | POA: Diagnosis not present

## 2021-10-03 MED ORDER — HYDROCODONE-ACETAMINOPHEN 10-325 MG PO TABS: 1.0000 | ORAL_TABLET | Freq: Four times a day (QID) | ORAL | 0 refills | Status: DC | PRN

## 2021-10-03 NOTE — Progress Notes (Addendum)
? ?Office Visit Note ?  ?Patient: Patricia Sutton           ?Date of Birth: 05/11/1946           ?MRN: [id number] ?Visit Date: 10/03/2021 ?             ?Requested by: Yolanda MangesWilson, Alex M, DO ?[phone] ?[address] ?[place],  [place] [id number] ?PCP: Yolanda MangesWilson, Alex M, DO ? ? ?Assessment & Plan: ?Visit Diagnoses:  ?1. Spondylosis without myelopathy or radiculopathy, lumbar region   ?2. Spinal stenosis of lumbosacral region   ?3. Paresthesia of skin   ? ? ?Plan: Avoid bending, stooping and avoid lifting weights greater than 10 lbs. ?Avoid prolong standing and walking. ?Order for a new walker with wheels. ?Surgery scheduling secretary Tivis RingerSherri Billings, will call you in the next week to schedule for surgery.  ?Surgery recommended is a one level lumbar partial hemilaminectomy left L4-5 this would be done with surgical microscope. ?Take hydrocodone for for pain. ?Risk of surgery includes risk of infection 1 in 300 patients, bleeding 1/100% chance you would need a transfusion.   Risk to the nerves is one in 10,000. ? ?Expect improved walking and standing tolerance. Expect relief of leg pain but numbness ?may persist depending on the length and degree of pressure that has been present. ?  ? ?Follow-Up Instructions: No follow-ups on file.  ? ?Orders:  ?No orders of the defined types were placed in this encounter. ? ?No orders of the defined types were placed in this encounter. ? ? ? ? Procedures: ?No procedures performed ? ? ?Clinical Data: ?Findings:  ?Narrative & Impression ?CLINICAL DATA:  Lumbar radiculopathy with symptoms persisting over 6 ?weeks ?? ?EXAM: ?MRI LUMBAR SPINE WITHOUT CONTRAST ?? ?TECHNIQUE: ?Multiplanar, multisequence MR imaging of the lumbar spine was ?performed. No intravenous contrast was administered. ?? ?COMPARISON:  05/24/2019 ?? ?FINDINGS: ?Segmentation:  5 lumbar type vertebrae ?? ?Alignment:  Physiologic. ?? ?Vertebrae:  No fracture, evidence of discitis, or bone lesion. ?? ?Conus medullaris and cauda  equina: Conus extends to the L1 level. ?Conus and cauda equina appear normal. ?? ?Paraspinal and other soft tissues: Negative for perispinal mass or ?inflammation ?? ?Disc levels: ?? ?T12- L1: Ventral spondylitic spurring. Small central disc protrusion ?? ?L1-L2: Ventral spondylitic spurring.  Mild facet spurring. ?? ?L2-L3: Moderate facet spurring asymmetric to the left. Ventral ?spondylitic spurring. No herniation or impingement ?? ?L3-L4: Moderate facet spurring. Ventral spondylitic spurring. No ?neural impingement ?? ?L4-L5: Facet spurring which is bulky on the left. The disc is ?narrowed and bulging with mild left foraminal stenosis. Left ?subarticular recess narrowing that could affect the left L5 nerve ?root ?? ?L5-S1:Bulky degenerative facet spurring. No herniation or ?impingement. ?? ?IMPRESSION: ?1. Generalized lumbar spine degeneration especially affecting ?inferior facets. More defined interspinous ganglion at L4-5 which ?implies abnormal motion at this level. ?2. L4-5 chronic left subarticular recess stenosis that could affect ?the left L5 nerve root. ?? ?? ?Electronically Signed ?  By: Tiburcio PeaJonathan  Watts M.D. ?  On: 09/22/2021 21:07 ? ?COMPARISON: CT abdomen pelvis 08/23/2020 ? ?FINDINGS: ?There is diffuse decreased bone mineralization. Moderate to severe ?pubic symphysis joint space narrowing with mild peripheral ?degenerative osteophytes better seen on prior CT. ? ?Mild bilateral femoroacetabular joint space narrowing and mild ?superolateral acetabular degenerative osteophytes. ? ?The bilateral sacroiliac joint spaces are maintained. No acute ?fracture is seen. No dislocation. ? 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 ? ?Electronically Signed ?By: Neita Garnetonald Viola M.D. ?On: 09/26/2021 13:53 ? ?  CT SPINE LUMBAR WO CONTRAST  ?Narrative  ?CLINICAL DATA: Pain and pulling sensation with leg movement. ? ?EXAM: ?CT LUMBAR SPINE WITHOUT  CONTRAST ? ?TECHNIQUE: ?Multidetector CT imaging of the lumbar spine was performed without ?intravenous contrast administration. Multiplanar CT image ?reconstructions were also generated. ? ?RADIATION DOSE REDUCTION: This exam was performed according to the ?departmental dose-optimization program which includes automated ?exposure control, adjustment of the mA and/or kV according to ?patient size and/or use of iterative reconstruction technique. ? ?COMPARISON: CT abdomen and pelvis 08/23/2020. Lumbar spine MRI ?10/15/2008. ? ?FINDINGS: ?Segmentation: 5 lumbar type vertebrae. ? ?Alignment: Minimal right convex curvature of the lumbar spine. No ?significant listhesis. ? ?Vertebrae: No acute fracture or suspicious osseous lesion. ? ?Paraspinal and other soft tissues: Abdominal aortic atherosclerosis ?without aneurysm. Cholecystectomy. ? ?Disc levels: ? ?T12-L1: Calcified left paracentral disc protrusion and ?asymmetrically severe left facet arthrosis without evidence of ?significant stenosis. ? ?L1-2: Moderate to severe facet arthrosis without evidence of ?significant stenosis. ? ?L2-3: Mild disc bulging and severe facet arthrosis without evidence ?of significant stenosis. ? ?L3-4: Mild disc bulging and severe facet arthrosis without evidence ?of significant stenosis. ? ?L4-5: Disc bulging, endplate spurring, and mild right and severe ?left facet arthrosis result in mild spinal stenosis and mild left ?neural foraminal stenosis. ? ?L5-S1: Minimal disc bulging and severe facet arthrosis without ?evidence of significant stenosis. ? ?The above described degenerative changes are similar to the 2022 CT ?but have progressed from the 2010 MRI. ? ?IMPRESSION: ?1. No acute osseous abnormality. ?2. Advanced lumbar facet arthrosis. ?3. Mild spinal stenosis and mild left neural foraminal stenosis at ?L4-5. ?4. Aortic Atherosclerosis (ICD10-I70.0). ? ?Electronically Signed ?By: Sebastian Ache M.D. ?On: 09/26/2021 16:58   ? ? ?Subjective: ?Chief Complaint  ?Patient presents with  ?? Lower Back - Pain  ? ? ?76 year old female with severe left low back pain with radiation into the left PSIS and down the left lateral thigh and calf into the left foot dorsal great toe. Pain is greatest with standing and walking and  ?In the past some relief with sitting and bending and stooping. She has had previous RFA of facet joints ?But she did not have with level of pain and discomfort when that procedure was done 1-2 years ago. She reports having rolled over in bed with the onset of the excruiating pain that has not stopped. She presented to the [facility] with severe pain and was given a CT scan and placed on tramadol. She feels better walking stooped over with a cane than standing upright.  ? ? ?Review of Systems  ?Constitutional: Negative.   ?HENT: Negative.    ?Eyes: Negative.   ?Respiratory: Negative.    ?Cardiovascular: Negative.   ?Gastrointestinal: Negative.   ?Endocrine: Negative.   ?Genitourinary: Negative.   ?Musculoskeletal: Negative.   ?Skin: Negative.   ?Allergic/Immunologic: Negative.   ?Neurological: Negative.   ?Hematological: Negative.   ?Psychiatric/Behavioral: Negative.    ? ? ?Objective: ?Vital Signs: BP 127/61 (BP Location: Left Arm, Patient Position: Sitting)   Pulse 70   Ht 5\' 3"  (1.6 m)   Wt 100 lb (45.4 kg)   BMI 17.71 kg/m?  ? ?Physical Exam ?Constitutional:   ?   Appearance: She is well-developed.  ?HENT:  ?   Head: Normocephalic and atraumatic.  ?Eyes:  ?   Pupils: Pupils are equal, round, and reactive to light.  ?Pulmonary:  ?   Effort: Pulmonary effort is normal.  ?   Breath sounds: Normal breath  sounds.  ?Abdominal:  ?   General: Bowel sounds are normal.  ?   Palpations: Abdomen is soft.  ?Musculoskeletal:  ?   Cervical back: Normal range of motion and neck supple.  ?   Lumbar back: Negative right straight leg raise test and negative left straight leg raise test.  ?Skin: ?   General: Skin is warm and  dry.  ?Neurological:  ?   Mental Status: She is alert and oriented to person, place, and time.  ?Psychiatric:     ?   Behavior: Behavior normal.     ?   Thought Content: Thought content normal.     ?   Judgment: Judgment normal.  ? ?Back Exam  ? ?Te

## 2021-10-03 NOTE — Patient Instructions (Signed)
Plan: Avoid bending, stooping and avoid lifting weights greater than 10 lbs. ?Avoid prolong standing and walking. ?Order for a new walker with wheels. ?Surgery scheduling secretary Kandice Hams, will call you in the next week to schedule for surgery.  ?Surgery recommended is a one level lumbar partial hemilaminectomy left L4-5 this would be done with surgical microscope. ?Take hydrocodone for for pain. ?Risk of surgery includes risk of infection 1 in 300 patients, bleeding 1/100% chance you would need a transfusion.   Risk to the nerves is one in 10,000. ? ?Expect improved walking and standing tolerance. Expect relief of leg pain but numbness ?may persist depending on the length and degree of pressure that has been present. ?  ? ?

## 2021-10-04 ENCOUNTER — Telehealth: Payer: Self-pay | Admitting: Physical Medicine and Rehabilitation

## 2021-10-04 NOTE — Telephone Encounter (Signed)
Pt called to confirm she will keep her appt with Dr. Alvester Morin. Pt states she wants to hold off on surgery and go with what Dr Otelia Sergeant wanted for pt to see Dr. Alvester Morin. Please call pt about this matter at [phone]. ?

## 2021-10-06 NOTE — Telephone Encounter (Signed)
Printed to give to [name]  ?

## 2021-10-06 NOTE — Telephone Encounter (Signed)
[name] aware of this ?

## 2021-10-11 ENCOUNTER — Telehealth: Payer: Self-pay | Admitting: Specialist

## 2021-10-11 MED ORDER — HYDROCODONE-ACETAMINOPHEN 10-325 MG PO TABS: 1.0000 | ORAL_TABLET | Freq: Four times a day (QID) | ORAL | 0 refills | Status: DC | PRN

## 2021-10-11 NOTE — Telephone Encounter (Signed)
Patient called needing Rx refilled Hydrocodone. The number to contact patient is [phone] ?

## 2021-10-13 ENCOUNTER — Encounter: Payer: Medicare HMO | Admitting: Physical Medicine and Rehabilitation

## 2021-10-19 ENCOUNTER — Other Ambulatory Visit: Payer: Self-pay | Admitting: Specialist

## 2021-10-19 NOTE — Telephone Encounter (Signed)
Patient was asking for a month supply of her pain meds, I advised that per the laws he can only write it for 7 day supply at the most. She states that due to the mix up with her appt with Dr. Alvester Morin for the RFA, she is now scheduled for 11/10/21

## 2021-10-19 NOTE — Telephone Encounter (Signed)
Pt called requesting a refill of hydrocodone. Please send to pharmacy on file. Pt is asking for a call back from [place] . Pt states shje has questions about her pain medication. Please call pt at [phone]

## 2021-10-20 ENCOUNTER — Other Ambulatory Visit: Payer: Self-pay | Admitting: Specialist

## 2021-10-20 MED ORDER — HYDROCODONE-ACETAMINOPHEN 10-325 MG PO TABS: 1.0000 | ORAL_TABLET | Freq: Four times a day (QID) | ORAL | 0 refills | Status: DC | PRN

## 2021-10-20 NOTE — Telephone Encounter (Signed)
Pt called requesting a refill of hydrocodone. Please send to pharmacy on file. Please call pt when meds have been sent in she states to be  in severe pains. Pt phone number is [phone].

## 2021-10-21 ENCOUNTER — Other Ambulatory Visit: Payer: Self-pay | Admitting: Specialist

## 2021-10-28 ENCOUNTER — Other Ambulatory Visit: Payer: Self-pay | Admitting: Radiology

## 2021-10-28 NOTE — Telephone Encounter (Signed)
Patient called in for refill on Rx Hydrocodone

## 2021-10-28 NOTE — Telephone Encounter (Signed)
Sent request to [facility] 

## 2021-10-31 ENCOUNTER — Telehealth: Payer: Self-pay | Admitting: Specialist

## 2021-10-31 NOTE — Telephone Encounter (Signed)
I called and advised that I have sent a request to [name] and Fayrene Fearing, I advised I did not know which one would refill it since [name] was out of the office till Wednesday. We moved her appt from 11/03/21 to 11/28/21 after her RFA

## 2021-10-31 NOTE — Telephone Encounter (Signed)
Pt called requesting a call back from [place] about an upcoming appt. Pt also asking for a refill of pain meds. Please send to pharmacy on file. Pt phone number is [phone].

## 2021-11-02 ENCOUNTER — Other Ambulatory Visit: Payer: Self-pay | Admitting: Specialist

## 2021-11-02 NOTE — Telephone Encounter (Signed)
Patient called advised she is out of her pain medication (Hydrocodone)  Please see previous messages.  The number to contact patient is  [phone]

## 2021-11-03 ENCOUNTER — Telehealth: Payer: Self-pay | Admitting: Specialist

## 2021-11-03 ENCOUNTER — Ambulatory Visit: Payer: Medicare HMO | Admitting: Specialist

## 2021-11-03 MED ORDER — HYDROCODONE-ACETAMINOPHEN 10-325 MG PO TABS: 1.0000 | ORAL_TABLET | Freq: Four times a day (QID) | ORAL | 0 refills | Status: DC | PRN

## 2021-11-03 NOTE — Telephone Encounter (Signed)
Patient called advised she is out of her pain medication (Hydrocodone)  Please see previous messages.  The number to contact patient is  [phone] 

## 2021-11-03 NOTE — Telephone Encounter (Signed)
This is pending with Dr. Nitka  

## 2021-11-10 ENCOUNTER — Encounter: Payer: Self-pay | Admitting: Physical Medicine and Rehabilitation

## 2021-11-10 ENCOUNTER — Other Ambulatory Visit: Payer: Self-pay | Admitting: Specialist

## 2021-11-10 ENCOUNTER — Ambulatory Visit: Payer: Self-pay

## 2021-11-10 ENCOUNTER — Ambulatory Visit (INDEPENDENT_AMBULATORY_CARE_PROVIDER_SITE_OTHER): Payer: Medicare HMO | Admitting: Physical Medicine and Rehabilitation

## 2021-11-10 VITALS — BP 153/68 | HR 73

## 2021-11-10 DIAGNOSIS — M47816 Spondylosis without myelopathy or radiculopathy, lumbar region: Secondary | ICD-10-CM

## 2021-11-10 DIAGNOSIS — M4807 Spinal stenosis, lumbosacral region: Secondary | ICD-10-CM

## 2021-11-10 MED ORDER — HYDROCODONE-ACETAMINOPHEN 10-325 MG PO TABS: 0.5000 | ORAL_TABLET | Freq: Four times a day (QID) | ORAL | 0 refills | Status: DC | PRN

## 2021-11-10 MED ORDER — METHYLPREDNISOLONE ACETATE 80 MG/ML IJ SUSP
80.0000 mg | Freq: Once | INTRAMUSCULAR | Status: AC
  Administered 2021-11-10: 80 mg

## 2021-11-10 NOTE — Progress Notes (Unsigned)
Pt state lower back pain that travels to both legs. Pt state walking, sitting and standing makes the pain worse. Pt state uses heat/ ice and pain meds to help ease her pain.  Numeric Pain Rating Scale and Functional Assessment Average Pain 6   In the last MONTH (on 0-10 scale) has pain interfered with the following?  1. General activity like being  able to carry out your everyday physical activities such as walking, climbing stairs, carrying groceries, or moving a chair?  Rating(9)   +Driver, -BT, -Dye Allergies.

## 2021-11-10 NOTE — Patient Instructions (Signed)

## 2021-11-10 NOTE — Telephone Encounter (Signed)
Patricia Sutton is calling for a med refill of Hydrocodone please advise.

## 2021-11-11 ENCOUNTER — Telehealth: Payer: Self-pay | Admitting: Physical Medicine and Rehabilitation

## 2021-11-11 NOTE — Telephone Encounter (Signed)
Pt called requesting a call back. Pt had an appt yesterday and has questions about her medication given. Please call pt at [phone].

## 2021-11-17 ENCOUNTER — Ambulatory Visit: Payer: Self-pay

## 2021-11-17 ENCOUNTER — Telehealth: Payer: Self-pay | Admitting: Specialist

## 2021-11-17 ENCOUNTER — Other Ambulatory Visit: Payer: Self-pay | Admitting: Specialist

## 2021-11-17 ENCOUNTER — Encounter: Payer: Self-pay | Admitting: Physical Medicine and Rehabilitation

## 2021-11-17 ENCOUNTER — Ambulatory Visit (INDEPENDENT_AMBULATORY_CARE_PROVIDER_SITE_OTHER): Payer: Medicare HMO | Admitting: Physical Medicine and Rehabilitation

## 2021-11-17 DIAGNOSIS — M4807 Spinal stenosis, lumbosacral region: Secondary | ICD-10-CM

## 2021-11-17 DIAGNOSIS — M47816 Spondylosis without myelopathy or radiculopathy, lumbar region: Secondary | ICD-10-CM

## 2021-11-17 MED ORDER — HYDROCODONE-ACETAMINOPHEN 10-325 MG PO TABS: 0.5000 | ORAL_TABLET | Freq: Three times a day (TID) | ORAL | 0 refills | Status: DC

## 2021-11-17 MED ORDER — METHYLPREDNISOLONE ACETATE 80 MG/ML IJ SUSP
80.0000 mg | Freq: Once | INTRAMUSCULAR | Status: AC
  Administered 2021-11-17: 80 mg

## 2021-11-17 NOTE — Patient Instructions (Signed)

## 2021-11-17 NOTE — Telephone Encounter (Signed)
Pt called requesting a refill of pain medication. Please send to pharmacy on file. Pt phone number is [phone] 

## 2021-11-17 NOTE — Progress Notes (Unsigned)
Pt state lower back pain that travels to both legs. Pt state walking, sitting and standing makes the pain worse. Pt state uses heat/ ice and pain meds to help ease her pain.  Numeric Pain Rating Scale and Functional Assessment Average Pain 8   In the last MONTH (on 0-10 scale) has pain interfered with the following?  1. General activity like being  able to carry out your everyday physical activities such as walking, climbing stairs, carrying groceries, or moving a chair?  Rating(10)   +Driver, -BT, -Dye Allergies.

## 2021-11-17 NOTE — Telephone Encounter (Signed)
Patient was here. She would like hydrocodone called in for her

## 2021-11-22 NOTE — Progress Notes (Signed)
Patricia Sutton - 76 y.o. female MRN [id number]  Date of birth: 07/14/1945  Office Visit Note: Visit Date: 11/10/2021 PCP: Yolanda Manges, DO Referred by: Juanda Chance, NP  Subjective: Chief Complaint  Patient presents with   Lower Back - Pain   Right Leg - Pain   Left Leg - Pain   HPI:  Patricia Sutton is a 76 y.o. female who comes in todayfor planned repeat radiofrequency ablation of the Left L4-5, L5-S1, and L4-L5  Lumbar facet joints. This would be ablation of the corresponding medial branches and/or dorsal rami.  Patient has had double diagnostic blocks with more than 70% relief.  Subsequent ablation gave them more than 6 months of over 60% relief.  They have had chronic back pain for quite some time, more than 3 months, which has been an ongoing situation with recalcitrant axial back pain.  They have no radicular pain.  Their axial pain is worse with standing and ambulating and on exam today with facet loading.  They have had physical therapy as well as home exercise program.  The imaging noted in the chart below indicated facet pathology. Accordingly they meet all the criteria and qualification for for radiofrequency ablation and we are going to complete this today hopefully for more longer term relief as part of comprehensive management program.   ROS Otherwise per HPI.  Assessment & Plan: Visit Diagnoses:    ICD-10-CM   1. Spondylosis without myelopathy or radiculopathy, lumbar region  M47.816 XR C-ARM NO REPORT    Radiofrequency,Lumbar    methylPREDNISolone acetate (DEPO-MEDROL) injection 80 mg      Plan: No additional findings.   Meds & Orders:  Meds ordered this encounter  Medications   methylPREDNISolone acetate (DEPO-MEDROL) injection 80 mg    Orders Placed This Encounter  Procedures   Radiofrequency,Lumbar   XR C-ARM NO REPORT    Follow-up: Return if symptoms worsen or fail to improve.   Procedures: No procedures performed  Lumbar Facet Joint Nerve  Denervation  Patient: Patricia Sutton      Date of Birth: Feb 27, 1946 MRN: [id number] PCP: Yolanda Manges, DO      Visit Date: 11/10/2021   Universal Protocol:    Date/Time: 06/27/236:36 AM  Consent Given By: the patient  Position: PRONE  Additional Comments: Vital signs were monitored before and after the procedure. Patient was prepped and draped in the usual sterile fashion. The correct patient, procedure, and site was verified.   Injection Procedure Details:   Procedure diagnoses:  1. Spondylosis without myelopathy or radiculopathy, lumbar region      Meds Administered:  Meds ordered this encounter  Medications   methylPREDNISolone acetate (DEPO-MEDROL) injection 80 mg     Laterality: Left  Location/Site:  L4-L5, L3 and L4 medial branches and L5-S1, L4 medial branch and L5 dorsal ramus  Needle: 18 ga.,  10mm active tip, RF Cannula  Needle Placement: Along juncture of superior articular process and transverse pocess  Findings:  -Comments:  Procedure Details: For each desired target nerve, the corresponding transverse process (sacral ala for the L5 dorsal rami) was identified and the fluoroscope was positioned to square off the endplates of the corresponding vertebral body to achieve a true AP midline view.  The beam was then obliqued 15 to 20 degrees and caudally tilted 15 to 20 degrees to line up a trajectory along the target nerves. The skin over the target of the junction of superior articulating process and transverse  process (sacral ala for the L5 dorsal rami) was infiltrated with 1ml of 1% Lidocaine without Epinephrine.  The 18 gauge 10mm active tip outer cannula was advanced in trajectory view to the target.  This procedure was repeated for each target nerve.  Then, for all levels, the outer cannula placement was fine-tuned and the position was then confirmed with bi-planar imaging.    Test stimulation was done both at sensory and motor levels to ensure  there was no radicular stimulation. The target tissues were then infiltrated with 1 ml of 1% Lidocaine without Epinephrine. Subsequently, a percutaneous neurotomy was carried out for 90 seconds at 80 degrees Celsius.  After the completion of the lesion, 1 ml of injectate was delivered. It was then repeated for each facet joint nerve mentioned above. Appropriate radiographs were obtained to verify the probe placement during the neurotomy.   Additional Comments:  No complications occurred Dressing: 2 x 2 sterile gauze and Band-Aid    Post-procedure details: Patient was observed during the procedure. Post-procedure instructions were reviewed.  Patient left the clinic in stable condition.       Clinical History: MRI LUMBAR SPINE WITHOUT AND WITH CONTRAST     TECHNIQUE:  Multiplanar and multiecho pulse sequences of the lumbar spine were  obtained without and with intravenous contrast.     CONTRAST:  9mL MULTIHANCE GADOBENATE DIMEGLUMINE 529 MG/ML IV SOLN     COMPARISON:  Radiography 01/22/2018.  MRI 05/11/2017.     FINDINGS:  Segmentation: 5 lumbar type vertebral bodies as numbered previously.     Alignment:  Normal except for 1 mm of anterolisthesis at L5-S1.     Vertebrae:  No fracture or primary bone lesion.     Conus medullaris and cauda equina: Conus extends to the L1 level.  Conus and cauda equina appear normal.     Paraspinal and other soft tissues: Negative     Disc levels:     T12-L1: Shallow left paracentral disc protrusion indents the thecal  sac slightly but does not cause any neural compression. No change  since the previous study.     L1-2 and L2-3: Normal disc spirit mild facet hypertrophy without  edema or stenosis.     L3-4: Minimal bulging of the disc. Minimal facet hypertrophy. No  stenosis.     L4-5: Mild bulging of the disc. Bilateral facet degeneration and  hypertrophy. No compressive stenosis. Findings could contribute to  back pain or referred  facet syndrome pain.     L5-S1: 1 mm anterolisthesis. No disc abnormality. Bilateral facet  degeneration worse on the left. No sign of neural compression.  Findings could relate to back pain or referred facet syndrome pain.     Compared to the study of 2018, the findings are quite similar.     IMPRESSION:  No change since 2018. No evidence of stenosis or neural compression.  Lower lumbar degenerative changes, most notably that of facet  degenerative disease at L4-5 and L5-S1 which could contribute to  back pain or referred facet syndrome pain. Single most abnormal  facet joint is on the left at L5-S1.        Electronically Signed    By: Paulina Fusi M.D.    On: 08/10/2018 11:58     Objective:  VS:  HT:    WT:   BMI:     BP:(!) 153/68  HR:73bpm  TEMP: ( )  RESP:  Physical Exam Vitals and nursing note reviewed.  Constitutional:  General: She is not in acute distress.    Appearance: Normal appearance. She is not ill-appearing.  HENT:     Head: Normocephalic and atraumatic.     Right Ear: External ear normal.     Left Ear: External ear normal.  Eyes:     Extraocular Movements: Extraocular movements intact.  Cardiovascular:     Rate and Rhythm: Normal rate.     Pulses: Normal pulses.  Pulmonary:     Effort: Pulmonary effort is normal. No respiratory distress.  Abdominal:     General: There is no distension.     Palpations: Abdomen is soft.  Musculoskeletal:        General: Tenderness present.     Cervical back: Neck supple.     Right lower leg: No edema.     Left lower leg: No edema.     Comments: Patient has good distal strength with no pain over the greater trochanters.  No clonus or focal weakness. Patient somewhat slow to rise from a seated position to full extension.  There is concordant low back pain with facet loading and lumbar spine extension rotation.  There are no definitive trigger points but the patient is somewhat tender across the lower back and  PSIS.  There is no pain with hip rotation.   Skin:    Findings: No erythema, lesion or rash.  Neurological:     General: No focal deficit present.     Mental Status: She is alert and oriented to person, place, and time.     Sensory: No sensory deficit.     Motor: No weakness or abnormal muscle tone.     Coordination: Coordination normal.  Psychiatric:        Mood and Affect: Mood normal.        Behavior: Behavior normal.      Imaging: No results found.

## 2021-11-22 NOTE — Procedures (Signed)
Lumbar Facet Joint Nerve Denervation  Patient: Patricia Sutton      Date of Birth: November 26, 1945 MRN: [id number] PCP: Yolanda Manges, DO      Visit Date: 11/10/2021   Universal Protocol:    Date/Time: 06/27/236:36 AM  Consent Given By: the patient  Position: PRONE  Additional Comments: Vital signs were monitored before and after the procedure. Patient was prepped and draped in the usual sterile fashion. The correct patient, procedure, and site was verified.   Injection Procedure Details:   Procedure diagnoses:  1. Spondylosis without myelopathy or radiculopathy, lumbar region      Meds Administered:  Meds ordered this encounter  Medications   methylPREDNISolone acetate (DEPO-MEDROL) injection 80 mg     Laterality: Left  Location/Site:  L4-L5, L3 and L4 medial branches and L5-S1, L4 medial branch and L5 dorsal ramus  Needle: 18 ga.,  10mm active tip, RF Cannula  Needle Placement: Along juncture of superior articular process and transverse pocess  Findings:  -Comments:  Procedure Details: For each desired target nerve, the corresponding transverse process (sacral ala for the L5 dorsal rami) was identified and the fluoroscope was positioned to square off the endplates of the corresponding vertebral body to achieve a true AP midline view.  The beam was then obliqued 15 to 20 degrees and caudally tilted 15 to 20 degrees to line up a trajectory along the target nerves. The skin over the target of the junction of superior articulating process and transverse process (sacral ala for the L5 dorsal rami) was infiltrated with 1ml of 1% Lidocaine without Epinephrine.  The 18 gauge 10mm active tip outer cannula was advanced in trajectory view to the target.  This procedure was repeated for each target nerve.  Then, for all levels, the outer cannula placement was fine-tuned and the position was then confirmed with bi-planar imaging.    Test stimulation was done both at sensory and  motor levels to ensure there was no radicular stimulation. The target tissues were then infiltrated with 1 ml of 1% Lidocaine without Epinephrine. Subsequently, a percutaneous neurotomy was carried out for 90 seconds at 80 degrees Celsius.  After the completion of the lesion, 1 ml of injectate was delivered. It was then repeated for each facet joint nerve mentioned above. Appropriate radiographs were obtained to verify the probe placement during the neurotomy.   Additional Comments:  No complications occurred Dressing: 2 x 2 sterile gauze and Band-Aid    Post-procedure details: Patient was observed during the procedure. Post-procedure instructions were reviewed.  Patient left the clinic in stable condition.

## 2021-11-23 ENCOUNTER — Other Ambulatory Visit: Payer: Self-pay | Admitting: Specialist

## 2021-11-23 DIAGNOSIS — M4807 Spinal stenosis, lumbosacral region: Secondary | ICD-10-CM

## 2021-11-23 NOTE — Telephone Encounter (Signed)
Patient called. She would like some pain medication called in for her pain. Her call back number is [phone]

## 2021-11-24 MED ORDER — HYDROCODONE-ACETAMINOPHEN 10-325 MG PO TABS
0.5000 | ORAL_TABLET | Freq: Three times a day (TID) | ORAL | 0 refills | Status: DC
Start: 2021-11-24 — End: 2021-11-30

## 2021-11-27 ENCOUNTER — Emergency Department (HOSPITAL_BASED_OUTPATIENT_CLINIC_OR_DEPARTMENT_OTHER)
Admission: EM | Admit: 2021-11-27 | Discharge: 2021-11-27 | Disposition: A | Discharge status: Home / Self Care | Payer: Medicare HMO | Attending: Emergency Medicine | Admitting: Emergency Medicine

## 2021-11-27 ENCOUNTER — Emergency Department (HOSPITAL_BASED_OUTPATIENT_CLINIC_OR_DEPARTMENT_OTHER): Payer: Medicare HMO

## 2021-11-27 ENCOUNTER — Other Ambulatory Visit: Payer: Self-pay

## 2021-11-27 DIAGNOSIS — N12 Tubulo-interstitial nephritis, not specified as acute or chronic: Secondary | ICD-10-CM | POA: Insufficient documentation

## 2021-11-27 DIAGNOSIS — E039 Hypothyroidism, unspecified: Secondary | ICD-10-CM | POA: Diagnosis not present

## 2021-11-27 DIAGNOSIS — R1031 Right lower quadrant pain: Secondary | ICD-10-CM

## 2021-11-27 DIAGNOSIS — R109 Unspecified abdominal pain: Secondary | ICD-10-CM | POA: Diagnosis present

## 2021-11-27 DIAGNOSIS — Z7951 Long term (current) use of inhaled steroids: Secondary | ICD-10-CM | POA: Insufficient documentation

## 2021-11-27 DIAGNOSIS — J45909 Unspecified asthma, uncomplicated: Secondary | ICD-10-CM | POA: Diagnosis not present

## 2021-11-27 DIAGNOSIS — K567 Ileus, unspecified: Secondary | ICD-10-CM | POA: Insufficient documentation

## 2021-11-27 DIAGNOSIS — Z79899 Other long term (current) drug therapy: Secondary | ICD-10-CM | POA: Insufficient documentation

## 2021-11-27 DIAGNOSIS — Z96651 Presence of right artificial knee joint: Secondary | ICD-10-CM | POA: Insufficient documentation

## 2021-11-27 DIAGNOSIS — J189 Pneumonia, unspecified organism: Secondary | ICD-10-CM

## 2021-11-27 DIAGNOSIS — Z7982 Long term (current) use of aspirin: Secondary | ICD-10-CM | POA: Diagnosis not present

## 2021-11-27 DIAGNOSIS — J449 Chronic obstructive pulmonary disease, unspecified: Secondary | ICD-10-CM | POA: Diagnosis not present

## 2021-11-27 DIAGNOSIS — J181 Lobar pneumonia, unspecified organism: Secondary | ICD-10-CM | POA: Diagnosis not present

## 2021-11-27 LAB — URINALYSIS, ROUTINE W REFLEX MICROSCOPIC
Bilirubin Urine: NEGATIVE
Glucose, UA: NEGATIVE mg/dL
Ketones, ur: NEGATIVE mg/dL
Nitrite: NEGATIVE
Protein, ur: NEGATIVE mg/dL
Specific Gravity, Urine: <=1.005 (ref 1.005–1.030)
pH: 5.5 (ref 5.0–8.0)

## 2021-11-27 LAB — COMPREHENSIVE METABOLIC PANEL
ALT: 20 U/L (ref 0–44)
AST: 18 U/L (ref 15–41)
Albumin: 3.7 g/dL (ref 3.5–5.0)
Alkaline Phosphatase: 80 U/L (ref 38–126)
Anion gap: 6 (ref 5–15)
BUN: 12 mg/dL (ref 8–23)
CO2: 26 mmol/L (ref 22–32)
Calcium: 8.9 mg/dL (ref 8.9–10.3)
Chloride: 106 mmol/L (ref 98–111)
Creatinine, Ser: 0.88 mg/dL (ref 0.44–1.00)
GFR, Estimated: >60 mL/min (ref >60)
Glucose, Bld: 133 mg/dL — ABNORMAL HIGH (ref 70–99)
Potassium: 4.6 mmol/L (ref 3.5–5.1)
Sodium: 138 mmol/L (ref 135–145)
Total Bilirubin: 0.3 mg/dL (ref 0.3–1.2)
Total Protein: 6.8 g/dL (ref 6.5–8.1)

## 2021-11-27 LAB — CBC
HCT: 45.4 % (ref 36.0–46.0)
Hemoglobin: 15.2 g/dL — ABNORMAL HIGH (ref 12.0–15.0)
MCH: 33.2 pg (ref 26.0–34.0)
MCHC: 33.5 g/dL (ref 30.0–36.0)
MCV: 99.1 fL (ref 80.0–100.0)
Platelets: 245 10*3/uL (ref 150–400)
RBC: 4.58 MIL/uL (ref 3.87–5.11)
RDW: 14.3 % (ref 11.5–15.5)
WBC: 12.4 10*3/uL — ABNORMAL HIGH (ref 4.0–10.5)
nRBC: 0 % (ref 0.0–0.2)

## 2021-11-27 LAB — URINALYSIS, MICROSCOPIC (REFLEX)

## 2021-11-27 LAB — LIPASE, BLOOD: Lipase: 37 U/L (ref 11–51)

## 2021-11-27 MED ORDER — IOHEXOL 300 MG/ML  SOLN
60.0000 mL | Freq: Once | INTRAMUSCULAR | Status: AC | PRN
  Administered 2021-11-27: 60 mL via INTRAVENOUS

## 2021-11-27 MED ORDER — ONDANSETRON HCL 4 MG/2ML IJ SOLN
4.0000 mg | Freq: Once | INTRAMUSCULAR | Status: AC
Start: 2021-11-27 — End: 2021-11-27
  Administered 2021-11-27: 4 mg via INTRAVENOUS
  Filled 2021-11-27: qty 2

## 2021-11-27 MED ORDER — AZITHROMYCIN 250 MG PO TABS: 250.0000 mg | ORAL_TABLET | Freq: Every day | ORAL | 0 refills | Status: DC

## 2021-11-27 MED ORDER — FENTANYL CITRATE PF 50 MCG/ML IJ SOSY
50.0000 ug | PREFILLED_SYRINGE | Freq: Once | INTRAMUSCULAR | Status: AC
  Administered 2021-11-27: 50 ug via INTRAVENOUS
  Filled 2021-11-27: qty 1

## 2021-11-27 MED ORDER — CEPHALEXIN 250 MG PO CAPS
500.0000 mg | ORAL_CAPSULE | Freq: Once | ORAL | Status: AC
  Administered 2021-11-27: 500 mg via ORAL
  Filled 2021-11-27: qty 2

## 2021-11-27 MED ORDER — FENTANYL CITRATE PF 50 MCG/ML IJ SOSY
50.0000 ug | PREFILLED_SYRINGE | Freq: Once | INTRAMUSCULAR | Status: DC
  Filled 2021-11-27: qty 1

## 2021-11-27 MED ORDER — CEFPODOXIME PROXETIL 200 MG PO TABS: 200.0000 mg | ORAL_TABLET | Freq: Two times a day (BID) | ORAL | 0 refills | Status: AC

## 2021-11-27 MED ORDER — ONDANSETRON 4 MG PO TBDP: 4.0000 mg | ORAL_TABLET | Freq: Three times a day (TID) | ORAL | 0 refills | Status: DC | PRN

## 2021-11-27 NOTE — ED Notes (Signed)
Patient verbalizes understanding of discharge instructions. Opportunity for questioning and answers were provided. Armband removed by staff, pt discharged from ED. Ambulated out to lobby  

## 2021-11-27 NOTE — ED Triage Notes (Signed)
Pt arrive pov, slow gait, c/o RLQ pain and right flank pain x 4 weeks, nausea x 2 days. Denies dysuria or fever. Took hydrocodone at 10am with no relief

## 2021-11-27 NOTE — ED Provider Notes (Signed)
[facility] EMERGENCY DEPARTMENT Provider Note   CSN: [id number] Arrival date & time: 11/27/21  1442     History  Chief Complaint  Patient presents with   Abdominal Pain    Patricia Sutton is a 76 y.o. female.  HPI     76yo female with history of COPD, depression, nephrolithiasis, back pain, cholecystectomy, who presents with concern for right sided abdominal/flank pain.  Reports symptoms began about 4 weeks ago initially with dull pain that came and went but over last week and in the last few days has significantly worsened with sharp pain to right lower abdomen.  Has had nausea over the last few days.  No dysuria, fever, vomiting. Had diarrhea last week but some small harder stools this week.  Cough unchanged, no dyspnea or chest pain.      Past Medical History:  Diagnosis Date   Anxiety    Arthritis    Asthma    Back pain, chronic    Bone spur of other site    spine   Chronic back pain    spurs on spine   COPD (chronic obstructive pulmonary disease) (HCC)    denies SOB   Depression    takes Effexor daily   GERD (gastroesophageal reflux disease)    takes Omeprazole daily   History of bronchitis    last time about 6+yrs ago   History of colon polyps    History of kidney stones    Hypothyroidism    takes Synthroid daily   Insomnia    takes Trazodone nightly and Ambien   Joint pain    Joint swelling    Nocturia    Pneumonia    hx of;last time about 6+yrs ago   PONV (postoperative nausea and vomiting)    Renal insufficiency    Sleep apnea    no CPAP; uses O2 at night 1l/min.   Urinary urgency     Past Surgical History:  Procedure Laterality Date   ABDOMINAL HYSTERECTOMY     complete   bladder tacked     COLONOSCOPY     ESOPHAGOGASTRODUODENOSCOPY     KNEE ARTHROPLASTY Right 07/18/2013   Procedure: COMPUTER ASSISTED RIGHT TOTAL KNEE ARTHROPLASTY;  Surgeon: Kerrin Champagne, MD;  Location: [facility] OR;  Service: Orthopedics;  Laterality: Right;   KNEE  ARTHROSCOPY Right 07/16/2012   Procedure: ARTHROSCOPY KNEE;  Surgeon: Kerrin Champagne, MD;  Location: [facility];  Service: Orthopedics;  Laterality: Right;  Right knee arthroscopy with chondroplastic shaving of retropatella surface     Home Medications Prior to Admission medications   Medication Sig Start Date End Date Taking? Authorizing Provider  azithromycin (ZITHROMAX) 250 MG tablet Take 1 tablet (250 mg total) by mouth daily. Take first 2 tablets together, then 1 every day until finished. 11/27/21  Yes Alvira Monday, MD  cefpodoxime (VANTIN) 200 MG tablet Take 1 tablet (200 mg total) by mouth 2 (two) times daily for 10 days. 11/27/21 12/07/21 Yes Alvira Monday, MD  ondansetron (ZOFRAN-ODT) 4 MG disintegrating tablet Take 1 tablet (4 mg total) by mouth every 8 (eight) hours as needed for nausea or vomiting. 11/27/21  Yes Alvira Monday, MD  albuterol (PROVENTIL) (2.5 MG/3ML) 0.083% nebulizer solution Take 2.5 mg by nebulization every 4 (four) hours as needed for wheezing or shortness of breath.    [provider]  amoxicillin-clavulanate (AUGMENTIN) 875-125 MG tablet Take 1 tablet by mouth 2 (two) times daily. 04/11/20   [provider]  aspirin EC 81 MG tablet Take 1 tablet (81 mg total) by mouth daily. 03/16/15   Catarina Hartshorn, MD  atorvastatin (LIPITOR) 40 MG tablet Take by mouth. 10/18/19   [provider]  atorvastatin (LIPITOR) 40 MG tablet Take 40 mg by mouth at bedtime. 01/13/20   [provider]  celecoxib (CELEBREX) 100 MG capsule Take 1 capsule (100 mg total) by mouth 2 (two) times daily. 06/19/19   Kerrin Champagne, MD  cetirizine (ZYRTEC) 10 MG tablet Take by mouth. 09/08/19 09/07/20  [provider]  clonazePAM (KLONOPIN) 0.5 MG tablet Take 0.5 mg by mouth at bedtime as needed. 04/28/20   [provider]  cyclobenzaprine (FLEXERIL) 5 MG tablet Take 1 tablet (5 mg total) by mouth 3 (three) times daily as needed. 08/20/21    Charlynne Pander, MD  diclofenac Sodium (VOLTAREN) 1 % GEL APPLY 2 TO 4 GRAMS TOPICALLY TO THE AFFECTED AREA 2 TO 3 TIMES DAILY 10/25/21   Kerrin Champagne, MD  DOK 100 MG capsule Take 100 mg by mouth 2 (two) times daily. 03/27/19   [provider]  doxycycline (VIBRA-TABS) 100 MG tablet Take 100 mg by mouth 2 (two) times daily. 04/30/20   [provider]  ergocalciferol (VITAMIN D2) 1.25 MG (50000 UT) capsule Take by mouth. 03/13/19   [provider]  fluconazole (DIFLUCAN) 150 MG tablet Take by mouth. 04/28/20   [provider]  fluticasone (FLONASE) 50 MCG/ACT nasal spray SHAKE LIQUID AND USE 2 SPRAYS IN EACH NOSTRIL DAILY 09/08/19   [provider]  fluticasone (FLONASE) 50 MCG/ACT nasal spray Place 2 sprays into both nostrils daily. 01/12/20   [provider]  Fluticasone-Umeclidin-Vilant (TRELEGY ELLIPTA) 100-62.5-25 MCG/INH AEPB Inhale into the lungs. 02/17/19   [provider]  Fluticasone-Umeclidin-Vilant (TRELEGY ELLIPTA) 100-62.5-25 MCG/INH AEPB Inhale into the lungs. 02/17/19   [provider]  HYDROcodone-acetaminophen (NORCO) 10-325 MG tablet Take 0.5 tablets by mouth in the morning, at noon, and at bedtime. 11/24/21   Kerrin Champagne, MD  hydrOXYzine (ATARAX/VISTARIL) 25 MG tablet TAKE 1 TABLET(25 MG) BY MOUTH EVERY 8 HOURS AS NEEDED FOR ANXIETY 03/04/19   [provider]  levofloxacin (LEVAQUIN) 750 MG tablet Take 750 mg by mouth daily. 04/18/20   [provider]  levothyroxine (SYNTHROID, LEVOTHROID) 50 MCG tablet Take 1 tablet by mouth daily. 09/04/17   [provider]  loratadine (CLARITIN) 10 MG tablet Take 10 mg by mouth daily. 06/25/19   [provider]  Misc. Devices MISC Hypoxia resolved.   Discontinue oxygen 10/28/19   [provider]  omeprazole (PRILOSEC) 20 MG capsule Take 20 mg by mouth 2 (two) times daily.    [provider]  pantoprazole (PROTONIX) 40 MG  tablet  11/14/19   [provider]  pantoprazole (PROTONIX) 40 MG tablet Take 40 mg by mouth daily. 02/15/20   [provider]  predniSONE (DELTASONE) 20 MG tablet Take by mouth. 10/18/19   [provider]  predniSONE (STERAPRED UNI-PAK 21 TAB) 5 MG (21) TBPK tablet Take by mouth. 04/30/20   [provider]  pregabalin (LYRICA) 100 MG capsule Take 100 mg by mouth 2 (two) times daily. 03/16/20   [provider]  pregabalin (LYRICA) 100 MG capsule Take by mouth. 10/18/19   [provider]  pregabalin (LYRICA) 200 MG capsule  05/21/19   [provider]  promethazine (PHENERGAN) 25 MG tablet Take 25 mg by mouth 3 (three) times daily as needed for  nausea or vomiting.    [provider]  promethazine (PHENERGAN) 25 MG tablet Take 1 tablet by mouth every 8 (eight) hours as needed. 12/31/19   [provider]  traZODone (DESYREL) 100 MG tablet Take 100 mg by mouth at bedtime.     [provider]  traZODone (DESYREL) 100 MG tablet Take by mouth.    [provider]  VIIBRYD 20 MG TABS Take 1 tablet by mouth daily. 09/05/17   [provider]  VIIBRYD 40 MG TABS Take 40 mg by mouth daily. 02/10/20   [provider]  Vilazodone HCl (VIIBRYD) 40 MG TABS Take 1 tablet by mouth daily. 11/18/19   [provider]      Allergies    Trazodone and Mirtazapine    Review of Systems   Review of Systems  Physical Exam Updated Vital Signs BP (!) 150/69   Pulse 63   Temp 98.2 F (36.8 C) (Oral)   Resp 16   Ht 5\' 3"  (1.6 m)   Wt 45.4 kg   SpO2 94%   BMI 17.71 kg/m  Physical Exam Vitals and nursing note reviewed.  Constitutional:      General: She is not in acute distress.    Appearance: She is well-developed. She is not diaphoretic.  HENT:     Head: Normocephalic and atraumatic.  Eyes:     Conjunctiva/sclera: Conjunctivae normal.  Cardiovascular:     Rate and Rhythm: Normal rate and  regular rhythm.     Heart sounds: Normal heart sounds. No murmur heard.    No friction rub. No gallop.  Pulmonary:     Effort: Pulmonary effort is normal. No respiratory distress.     Breath sounds: Normal breath sounds. No wheezing or rales.  Abdominal:     General: There is no distension.     Palpations: Abdomen is soft.     Tenderness: There is abdominal tenderness (RLQ). There is no guarding.  Musculoskeletal:        General: No tenderness.     Cervical back: Normal range of motion.  Skin:    General: Skin is warm and dry.     Findings: No erythema or rash.  Neurological:     Mental Status: She is alert and oriented to person, place, and time.     ED Results / Procedures / Treatments   Labs (all labs ordered are listed, but only abnormal results are displayed) Labs Reviewed  COMPREHENSIVE METABOLIC PANEL - Abnormal; Notable for the following components:      Result Value   Glucose, Bld 133 (*)    All other components within normal limits  CBC - Abnormal; Notable for the following components:   WBC 12.4 (*)    Hemoglobin 15.2 (*)    All other components within normal limits  URINALYSIS, ROUTINE W REFLEX MICROSCOPIC - Abnormal; Notable for the following components:   Hgb urine dipstick TRACE (*)    Leukocytes,Ua SMALL (*)    All other components within normal limits  URINALYSIS, MICROSCOPIC (REFLEX) - Abnormal; Notable for the following components:   Bacteria, UA MANY (*)    All other components within normal limits  LIPASE, BLOOD    EKG EKG Interpretation  Date/Time:  Sunday November 27 2021 17:22:26 EDT Ventricular Rate:  55 PR Interval:  122 QRS Duration: 85 QT Interval:  408 QTC Calculation: 391 R Axis:   -65 Text Interpretation: Sinus rhythm Left anterior fascicular block Abnormal R-wave progression, late transition No  significant change since last tracing Confirmed by Alvira Monday ([id number]) on 11/27/2021 6:42:58 PM  Radiology CT ABDOMEN PELVIS W  CONTRAST  Result Date: 11/27/2021 CLINICAL DATA:  Right lower quadrant abdominal pain. Right flank pain for 4 weeks. Nausea. EXAM: CT ABDOMEN AND PELVIS WITH CONTRAST TECHNIQUE: Multidetector CT imaging of the abdomen and pelvis was performed using the standard protocol following bolus administration of intravenous contrast. RADIATION DOSE REDUCTION: This exam was performed according to the departmental dose-optimization program which includes automated exposure control, adjustment of the mA and/or kV according to patient size and/or use of iterative reconstruction technique. CONTRAST:  35mL OMNIPAQUE IOHEXOL 300 MG/ML  SOLN COMPARISON:  CT of the abdomen pelvis without contrast 03/14/2019. CT the abdomen pelvis with contrast 09/26/2017 FINDINGS: Lower chest: Ill-defined airspace opacities are present in the right lower lobe with focal consolidation posteriorly and medially. The left lung bases clear. The heart size is normal. Hepatobiliary: Mild intra and extrahepatic biliary dilation is likely within normal limits following cholecystectomy. No focal hepatic lesions are present. Pancreas: Chronic pancreatic duct dilation again noted. No obstructing lesion. No focal pancreatic lesion or inflammation. Spleen: Choose 1 Adrenals/Urinary Tract: Adrenal glands are normal bilaterally. Kidneys are unremarkable. No stone or mass lesion is present. No obstruction present. Ureters are within normal limits. The urinary bladder is unremarkable. Stomach/Bowel: Stomach and duodenum are within normal limits. Proximal bowel wall thickening is noted proximally slight distension of proximal bowel. More distal small bowel is collapsed. No significant focal inflammatory change is present. The ascending transverse colon are within normal limits. Descending and sigmoid colon are normal. Vascular/Lymphatic: Atherosclerotic calcifications are present in the aorta branch vessels without aneurysm. No significant abdominal adenopathy is  present. Reproductive: Status post hysterectomy. No adnexal masses. Other: No abdominal wall hernia or abnormality. No abdominopelvic ascites. Musculoskeletal: Multilevel degenerative changes are present in the lower lumbar spine with facet hypertrophy. No acute abnormality is present. Bony pelvis is within normal limits. The hips are located and within normal limits. IMPRESSION: 1. Ill-defined airspace disease and consolidation in the right lower lobe compatible with pneumonia. 2. Focal wall thickening and slight distension of proximal small bowel with collapse of the more distal small bowel. This may represent a focal ileus. No other acute or focal lesion to explain the patient's symptoms. 3. Cholecystectomy and hysterectomy. 4. Multilevel degenerative changes in the lower lumbar spine. 5. Aortic Atherosclerosis (ICD10-I70.0). Electronically Signed   By: Marin Roberts M.D.   On: 11/27/2021 18:16    Procedures Procedures    Medications Ordered in ED Medications  ondansetron (ZOFRAN) injection 4 mg (4 mg Intravenous Given 11/27/21 1727)  fentaNYL (SUBLIMAZE) injection 50 mcg (50 mcg Intravenous Given 11/27/21 1734)  iohexol (OMNIPAQUE) 300 MG/ML solution 60 mL (60 mLs Intravenous Contrast Given 11/27/21 1745)  cephALEXin (KEFLEX) capsule 500 mg (500 mg Oral Given 11/27/21 1951)    ED Course/ Medical Decision Making/ A&P                           Medical Decision Making Amount and/or Complexity of Data Reviewed Labs: ordered. Radiology: ordered.  Risk Prescription drug management.   76yo female with history of COPD, depression, nephrolithiasis, back pain, cholecystectomy, who presents with concern for right sided abdominal/flank pain.  DDx includes appendicitis, pancreatitis, cholecdocolithiasis, pyelonephritis, nephrolithiasis, diverticulitis, AAA, SBO.  Labs obtained and personally evaluated and interpreted by me shows no acute abnormalities, no pancreatitis, hepatitis, no AKI, does  show mild leukocytosis.  CT abdomen pelvis evaluated by me and shows ill defeined consolidation in right lower lobe of lung compatible with pneumonia, focal wall thickening of proximal small bowel/focal ileus without signs of obstruction.  UA also consistent with urinary tract infection and suspect like pyelonephritis.  Is tolerating po, do not see indication for admission to the hospital.  Given rx for cefpodoxime for UTI/CAP and azithromycin. Recommend continued PCP and GI follow up . Patient discharged in stable condition with understanding of reasons to return.            Final Clinical Impression(s) / ED Diagnoses Final diagnoses:  Pyelonephritis  Community acquired pneumonia of right lower lobe of lung  Ileus (HCC)  Right lower quadrant abdominal pain    Rx / DC Orders ED Discharge Orders          Ordered    cefpodoxime (VANTIN) 200 MG tablet  2 times daily        11/27/21 1932    azithromycin (ZITHROMAX) 250 MG tablet  Daily        11/27/21 1932    ondansetron (ZOFRAN-ODT) 4 MG disintegrating tablet  Every 8 hours PRN        11/27/21 1944              Alvira Monday, MD 11/28/21 1639

## 2021-11-28 ENCOUNTER — Ambulatory Visit: Payer: Medicare HMO | Admitting: Specialist

## 2021-11-28 ENCOUNTER — Telehealth: Payer: Self-pay | Admitting: Specialist

## 2021-11-28 NOTE — Telephone Encounter (Signed)
Pt called and states that she would like to talk with christy because she had to go to the ED last night and is unable to make it today

## 2021-11-28 NOTE — Telephone Encounter (Signed)
Saving for [employer]

## 2021-11-30 ENCOUNTER — Other Ambulatory Visit: Payer: Self-pay | Admitting: Specialist

## 2021-11-30 DIAGNOSIS — M4807 Spinal stenosis, lumbosacral region: Secondary | ICD-10-CM

## 2021-11-30 NOTE — Telephone Encounter (Signed)
Pt would like a refill on Medication Hydrocodone

## 2021-11-30 NOTE — Addendum Note (Signed)
Addended by: Penne Lash, Neysa Bonito N on: 11/30/2021 11:00 AM   Modules accepted: Orders

## 2021-12-01 MED ORDER — HYDROCODONE-ACETAMINOPHEN 10-325 MG PO TABS: 0.5000 | ORAL_TABLET | Freq: Three times a day (TID) | ORAL | 0 refills | Status: DC

## 2021-12-02 NOTE — Telephone Encounter (Signed)
Patient just wanted to let [name] know why she missed her appt on Monday

## 2021-12-07 ENCOUNTER — Telehealth: Payer: Self-pay | Admitting: Specialist

## 2021-12-07 DIAGNOSIS — M4807 Spinal stenosis, lumbosacral region: Secondary | ICD-10-CM

## 2021-12-07 NOTE — Telephone Encounter (Signed)
Pt called requesting a refill of pain medication. Please send to pharmacy on file. Pt phone number is [phone]

## 2021-12-09 ENCOUNTER — Other Ambulatory Visit: Payer: Self-pay | Admitting: Surgery

## 2021-12-09 MED ORDER — TRAMADOL HCL 50 MG PO TABS: 50.0000 mg | ORAL_TABLET | Freq: Two times a day (BID) | ORAL | 0 refills | Status: AC | PRN

## 2021-12-09 NOTE — Telephone Encounter (Signed)
I called and advised that the this was sent in

## 2021-12-09 NOTE — Telephone Encounter (Signed)
Pt. Checking on status of pain medication refill request called in on Wednesday for Hydrocodone. She uses the [facility] [address] in [place]. Please advise.

## 2021-12-21 ENCOUNTER — Emergency Department (HOSPITAL_BASED_OUTPATIENT_CLINIC_OR_DEPARTMENT_OTHER)
Admission: EM | Admit: 2021-12-21 | Discharge: 2021-12-21 | Disposition: A | Discharge status: Home / Self Care | Payer: Medicare HMO | Attending: Emergency Medicine | Admitting: Emergency Medicine

## 2021-12-21 ENCOUNTER — Other Ambulatory Visit: Payer: Self-pay

## 2021-12-21 ENCOUNTER — Encounter (HOSPITAL_BASED_OUTPATIENT_CLINIC_OR_DEPARTMENT_OTHER): Payer: Self-pay | Admitting: Emergency Medicine

## 2021-12-21 ENCOUNTER — Emergency Department (HOSPITAL_BASED_OUTPATIENT_CLINIC_OR_DEPARTMENT_OTHER): Payer: Medicare HMO

## 2021-12-21 DIAGNOSIS — R1084 Generalized abdominal pain: Secondary | ICD-10-CM

## 2021-12-21 DIAGNOSIS — R739 Hyperglycemia, unspecified: Secondary | ICD-10-CM | POA: Diagnosis not present

## 2021-12-21 DIAGNOSIS — N390 Urinary tract infection, site not specified: Secondary | ICD-10-CM | POA: Diagnosis not present

## 2021-12-21 DIAGNOSIS — Z7982 Long term (current) use of aspirin: Secondary | ICD-10-CM | POA: Diagnosis not present

## 2021-12-21 DIAGNOSIS — R1031 Right lower quadrant pain: Secondary | ICD-10-CM | POA: Diagnosis present

## 2021-12-21 LAB — URINALYSIS, ROUTINE W REFLEX MICROSCOPIC
Bilirubin Urine: NEGATIVE
Glucose, UA: NEGATIVE mg/dL
Ketones, ur: NEGATIVE mg/dL
Nitrite: POSITIVE — AB
Protein, ur: NEGATIVE mg/dL
Specific Gravity, Urine: <=1.005 (ref 1.005–1.030)
pH: 5.5 (ref 5.0–8.0)

## 2021-12-21 LAB — CBC WITH DIFFERENTIAL/PLATELET
Abs Immature Granulocytes: 0.06 10*3/uL (ref 0.00–0.07)
Basophils Absolute: 0 10*3/uL (ref 0.0–0.1)
Basophils Relative: 0 %
Eosinophils Absolute: 0.1 10*3/uL (ref 0.0–0.5)
Eosinophils Relative: 1 %
HCT: 41.8 % (ref 36.0–46.0)
Hemoglobin: 14.3 g/dL (ref 12.0–15.0)
Immature Granulocytes: 1 %
Lymphocytes Relative: 39 %
Lymphs Abs: 3.9 10*3/uL (ref 0.7–4.0)
MCH: 33.3 pg (ref 26.0–34.0)
MCHC: 34.2 g/dL (ref 30.0–36.0)
MCV: 97.4 fL (ref 80.0–100.0)
Monocytes Absolute: 0.8 10*3/uL (ref 0.1–1.0)
Monocytes Relative: 8 %
Neutro Abs: 5.2 10*3/uL (ref 1.7–7.7)
Neutrophils Relative %: 51 %
Platelets: 240 10*3/uL (ref 150–400)
RBC: 4.29 MIL/uL (ref 3.87–5.11)
RDW: 13.7 % (ref 11.5–15.5)
WBC: 10.2 10*3/uL (ref 4.0–10.5)
nRBC: 0 % (ref 0.0–0.2)

## 2021-12-21 LAB — URINALYSIS, MICROSCOPIC (REFLEX): WBC, UA: >50 WBC/hpf (ref 0–5)

## 2021-12-21 LAB — COMPREHENSIVE METABOLIC PANEL
ALT: 17 U/L (ref 0–44)
AST: 22 U/L (ref 15–41)
Albumin: 3.3 g/dL — ABNORMAL LOW (ref 3.5–5.0)
Alkaline Phosphatase: 72 U/L (ref 38–126)
Anion gap: 9 (ref 5–15)
BUN: 8 mg/dL (ref 8–23)
CO2: 26 mmol/L (ref 22–32)
Calcium: 8.6 mg/dL — ABNORMAL LOW (ref 8.9–10.3)
Chloride: 99 mmol/L (ref 98–111)
Creatinine, Ser: 1.03 mg/dL — ABNORMAL HIGH (ref 0.44–1.00)
GFR, Estimated: 57 mL/min — ABNORMAL LOW (ref >60)
Glucose, Bld: 230 mg/dL — ABNORMAL HIGH (ref 70–99)
Potassium: 3.4 mmol/L — ABNORMAL LOW (ref 3.5–5.1)
Sodium: 134 mmol/L — ABNORMAL LOW (ref 135–145)
Total Bilirubin: 0.4 mg/dL (ref 0.3–1.2)
Total Protein: 6.3 g/dL — ABNORMAL LOW (ref 6.5–8.1)

## 2021-12-21 LAB — LIPASE, BLOOD: Lipase: 40 U/L (ref 11–51)

## 2021-12-21 MED ORDER — ONDANSETRON 4 MG PO TBDP: 4.0000 mg | ORAL_TABLET | Freq: Three times a day (TID) | ORAL | 0 refills | Status: AC | PRN

## 2021-12-21 MED ORDER — ONDANSETRON 4 MG PO TBDP: 4.0000 mg | ORAL_TABLET | Freq: Once | ORAL | Status: DC | PRN

## 2021-12-21 MED ORDER — IOHEXOL 300 MG/ML  SOLN
100.0000 mL | Freq: Once | INTRAMUSCULAR | Status: AC | PRN
  Administered 2021-12-21: 100 mL via INTRAVENOUS

## 2021-12-21 MED ORDER — ONDANSETRON HCL 4 MG/2ML IJ SOLN
4.0000 mg | Freq: Once | INTRAMUSCULAR | Status: AC
Start: 2021-12-21 — End: 2021-12-21
  Administered 2021-12-21: 4 mg via INTRAVENOUS
  Filled 2021-12-21: qty 2

## 2021-12-21 MED ORDER — CEPHALEXIN 500 MG PO CAPS: 500.0000 mg | ORAL_CAPSULE | Freq: Four times a day (QID) | ORAL | 0 refills | Status: DC

## 2021-12-21 MED ORDER — FENTANYL CITRATE PF 50 MCG/ML IJ SOSY
50.0000 ug | PREFILLED_SYRINGE | Freq: Once | INTRAMUSCULAR | Status: AC
  Administered 2021-12-21: 50 ug via INTRAVENOUS
  Filled 2021-12-21: qty 1

## 2021-12-21 MED ORDER — SODIUM CHLORIDE 0.9 % IV BOLUS
500.0000 mL | Freq: Once | INTRAVENOUS | Status: AC
  Administered 2021-12-21: 500 mL via INTRAVENOUS

## 2021-12-21 MED ORDER — SODIUM CHLORIDE 0.9 % IV SOLN
1.0000 g | Freq: Once | INTRAVENOUS | Status: AC
  Administered 2021-12-21: 1 g via INTRAVENOUS
  Filled 2021-12-21: qty 10

## 2021-12-21 NOTE — ED Provider Notes (Signed)
[facility] EMERGENCY DEPARTMENT Provider Note   CSN: [id number] Arrival date & time: 12/21/21  1619     History {Add pertinent medical, surgical, social history, OB history to HPI:1} Chief Complaint  Patient presents with   Abdominal Pain   Emesis    Patricia Sutton is a 76 y.o. female.  She is here with a complaint of right flank and right-sided abdominal pain nausea that started a few weeks ago.  She was seen here and told she had a UTI and pneumonia and possibly an early bowel obstruction.  She was treated with cefpodoxime and Zithromax.  She saw her primary care doctor who gave her some medication for some itching which sounds like fluconazole.  She said the pain never really resolved but worsened again about 3 days ago.  No fevers or chills.  Nausea no vomiting.  Little bit of dysuria and intermittent loose stools.  No blood.  The history is provided by the patient.  Abdominal Pain Pain location:  R flank, RLQ and RUQ Pain quality: aching   Pain severity:  Severe Onset quality:  Gradual Duration:  3 weeks Timing:  Constant Progression:  Worsening Chronicity:  New Context: not recent travel and not trauma   Relieved by:  Nothing Worsened by:  Nothing Ineffective treatments:  None tried Associated symptoms: diarrhea, dysuria, nausea and vomiting   Associated symptoms: no chest pain, no cough, no fever, no hematemesis, no hematochezia, no hematuria, no shortness of breath and no sore throat   Emesis Associated symptoms: abdominal pain and diarrhea   Associated symptoms: no cough, no fever and no sore throat        Home Medications Prior to Admission medications   Medication Sig Start Date End Date Taking? Authorizing Provider  albuterol (PROVENTIL) (2.5 MG/3ML) 0.083% nebulizer solution Take 2.5 mg by nebulization every 4 (four) hours as needed for wheezing or shortness of breath.    [provider]  amoxicillin-clavulanate (AUGMENTIN) 875-125 MG  tablet Take 1 tablet by mouth 2 (two) times daily. 04/11/20   [provider]  aspirin EC 81 MG tablet Take 1 tablet (81 mg total) by mouth daily. 03/16/15   Catarina Hartshorn, MD  atorvastatin (LIPITOR) 40 MG tablet Take by mouth. 10/18/19   [provider]  atorvastatin (LIPITOR) 40 MG tablet Take 40 mg by mouth at bedtime. 01/13/20   [provider]  azithromycin (ZITHROMAX) 250 MG tablet Take 1 tablet (250 mg total) by mouth daily. Take first 2 tablets together, then 1 every day until finished. 11/27/21   Alvira Monday, MD  celecoxib (CELEBREX) 100 MG capsule Take 1 capsule (100 mg total) by mouth 2 (two) times daily. 06/19/19   Kerrin Champagne, MD  cetirizine (ZYRTEC) 10 MG tablet Take by mouth. 09/08/19 09/07/20  [provider]  clonazePAM (KLONOPIN) 0.5 MG tablet Take 0.5 mg by mouth at bedtime as needed. 04/28/20   [provider]  cyclobenzaprine (FLEXERIL) 5 MG tablet Take 1 tablet (5 mg total) by mouth 3 (three) times daily as needed. 08/20/21   Charlynne Pander, MD  diclofenac Sodium (VOLTAREN) 1 % GEL APPLY 2 TO 4 GRAMS TOPICALLY TO THE AFFECTED AREA 2 TO 3 TIMES DAILY 10/25/21   Kerrin Champagne, MD  DOK 100 MG capsule Take 100 mg by mouth 2 (two) times daily. 03/27/19   [provider]  doxycycline (VIBRA-TABS) 100 MG tablet Take 100 mg by mouth 2 (two) times daily. 04/30/20   [provider]  ergocalciferol (VITAMIN D2) 1.25 MG (50000 UT) capsule Take by mouth. 03/13/19   [provider]  fluconazole (DIFLUCAN) 150 MG tablet Take by mouth. 04/28/20   [provider]  fluticasone (FLONASE) 50 MCG/ACT nasal spray SHAKE LIQUID AND USE 2 SPRAYS IN EACH NOSTRIL DAILY 09/08/19   [provider]  fluticasone (FLONASE) 50 MCG/ACT nasal spray Place 2 sprays into both nostrils daily. 01/12/20   [provider]  Fluticasone-Umeclidin-Vilant (TRELEGY ELLIPTA) 100-62.5-25 MCG/INH AEPB Inhale into the lungs. 02/17/19    [provider]  Fluticasone-Umeclidin-Vilant (TRELEGY ELLIPTA) 100-62.5-25 MCG/INH AEPB Inhale into the lungs. 02/17/19   [provider]  HYDROcodone-acetaminophen (NORCO) 10-325 MG tablet Take 0.5 tablets by mouth in the morning, at noon, and at bedtime. 12/01/21   Kerrin Champagne, MD  hydrOXYzine (ATARAX/VISTARIL) 25 MG tablet TAKE 1 TABLET(25 MG) BY MOUTH EVERY 8 HOURS AS NEEDED FOR ANXIETY 03/04/19   [provider]  levofloxacin (LEVAQUIN) 750 MG tablet Take 750 mg by mouth daily. 04/18/20   [provider]  levothyroxine (SYNTHROID, LEVOTHROID) 50 MCG tablet Take 1 tablet by mouth daily. 09/04/17   [provider]  loratadine (CLARITIN) 10 MG tablet Take 10 mg by mouth daily. 06/25/19   [provider]  Misc. Devices MISC Hypoxia resolved.   Discontinue oxygen 10/28/19   [provider]  omeprazole (PRILOSEC) 20 MG capsule Take 20 mg by mouth 2 (two) times daily.    [provider]  ondansetron (ZOFRAN-ODT) 4 MG disintegrating tablet Take 1 tablet (4 mg total) by mouth every 8 (eight) hours as needed for nausea or vomiting. 11/27/21   Alvira Monday, MD  pantoprazole (PROTONIX) 40 MG tablet  11/14/19   [provider]  pantoprazole (PROTONIX) 40 MG tablet Take 40 mg by mouth daily. 02/15/20   [provider]  predniSONE (DELTASONE) 20 MG tablet Take by mouth. 10/18/19   [provider]  predniSONE (STERAPRED UNI-PAK 21 TAB) 5 MG (21) TBPK tablet Take by mouth. 04/30/20   [provider]  pregabalin (LYRICA) 100 MG capsule Take 100 mg by mouth 2 (two) times daily. 03/16/20   [provider]  pregabalin (LYRICA) 100 MG capsule Take by mouth. 10/18/19   [provider]  pregabalin (LYRICA) 200 MG capsule  05/21/19   [provider]  promethazine (PHENERGAN) 25 MG tablet Take 25 mg by mouth 3 (three) times daily as needed for nausea or vomiting.    [provider]   promethazine (PHENERGAN) 25 MG tablet Take 1 tablet by mouth every 8 (eight) hours as needed. 12/31/19   [provider]  traMADol (ULTRAM) 50 MG tablet Take 1 tablet (50 mg total) by mouth every 12 (twelve) hours as needed. 12/09/21   Naida Sleight, PA-C  traZODone (DESYREL) 100 MG tablet Take 100 mg by mouth at bedtime.     [provider]  traZODone (DESYREL) 100 MG tablet Take by mouth.    [provider]  VIIBRYD 20 MG TABS Take 1 tablet by mouth daily. 09/05/17   [provider]  VIIBRYD 40 MG TABS Take 40 mg by mouth daily. 02/10/20   [provider]  Vilazodone HCl (VIIBRYD) 40 MG TABS Take 1 tablet by mouth daily. 11/18/19   [provider]      Allergies    Trazodone and Mirtazapine    Review of Systems   Review of Systems  Constitutional:  Negative for fever.  HENT:  Negative for sore throat.   Respiratory:  Negative for cough and shortness of breath.   Cardiovascular:  Negative for chest pain.  Gastrointestinal:  Positive for abdominal pain, diarrhea, nausea and vomiting. Negative for hematemesis and hematochezia.  Genitourinary:  Positive for dysuria. Negative for hematuria.  Skin:  Negative for rash.    Physical Exam Updated Vital Signs BP (!) 142/74   Pulse (!) 58   Temp 98.6 F (37 C)   Resp 20   Ht 5\' 3"  (1.6 m)   Wt 45 kg   SpO2 94%   BMI 17.57 kg/m  Physical Exam Vitals and nursing note reviewed.  Constitutional:      General: She is not in acute distress.    Appearance: Normal appearance. She is well-developed.  HENT:     Head: Normocephalic and atraumatic.  Eyes:     Conjunctiva/sclera: Conjunctivae normal.  Cardiovascular:     Rate and Rhythm: Normal rate and regular rhythm.     Heart sounds: No murmur heard. Pulmonary:     Effort: Pulmonary effort is normal. No respiratory distress.     Breath sounds: Normal breath sounds.  Abdominal:     Palpations: Abdomen is soft.     Tenderness: There is  abdominal tenderness in the right upper quadrant and right lower quadrant. There is no guarding or rebound.  Musculoskeletal:        General: No swelling.     Cervical back: Neck supple.  Skin:    General: Skin is warm and dry.     Capillary Refill: Capillary refill takes less than 2 seconds.  Neurological:     General: No focal deficit present.     Mental Status: She is alert.     Motor: No weakness.     Gait: Gait normal.     ED Results / Procedures / Treatments   Labs (all labs ordered are listed, but only abnormal results are displayed) Labs Reviewed  COMPREHENSIVE METABOLIC PANEL - Abnormal; Notable for the following components:      Result Value   Sodium 134 (*)    Potassium 3.4 (*)    Glucose, Bld 230 (*)    Creatinine, Ser 1.03 (*)    Calcium 8.6 (*)    Total Protein 6.3 (*)    Albumin 3.3 (*)    GFR, Estimated 57 (*)    All other components within normal limits  URINALYSIS, ROUTINE W REFLEX MICROSCOPIC - Abnormal; Notable for the following components:   APPearance CLOUDY (*)    Hgb urine dipstick SMALL (*)    Nitrite POSITIVE (*)    Leukocytes,Ua MODERATE (*)    All other components within normal limits  URINALYSIS, MICROSCOPIC (REFLEX) - Abnormal; Notable for the following components:   Bacteria, UA MANY (*)    All other components within normal limits  LIPASE, BLOOD  CBC WITH DIFFERENTIAL/PLATELET    EKG None  Radiology No results found.  Procedures Procedures  {Document cardiac monitor, telemetry assessment procedure when appropriate:1}  Medications Ordered in ED Medications  ondansetron (ZOFRAN-ODT) disintegrating tablet 4 mg (has no administration in time range)  cefTRIAXone (ROCEPHIN) 1 g in sodium chloride 0.9 % 100 mL IVPB (has no administration in time range)  sodium chloride 0.9 % bolus 500 mL (has no administration in time range)  ondansetron (ZOFRAN) injection 4 mg (has no administration in time range)  fentaNYL (SUBLIMAZE) injection 50  mcg (has no administration in time range)    ED Course/ Medical Decision  Making/ A&P                           Medical Decision Making Amount and/or Complexity of Data Reviewed Labs: ordered.  Risk Prescription drug management.  This patient complains of ***; this involves an extensive number of treatment Options and is a complaint that carries with it a high risk of complications and morbidity. The differential includes ***  I ordered, reviewed and interpreted labs, which included *** I ordered medication *** and reviewed PMP when indicated. I ordered imaging studies which included *** and I independently    visualized and interpreted imaging which showed *** Additional history obtained from *** Previous records obtained and reviewed *** I consulted *** and discussed lab and imaging findings and discussed disposition.  Cardiac monitoring reviewed, *** Social determinants considered, *** Critical Interventions: ***  After the interventions stated above, I reevaluated the patient and found *** Admission and further testing considered, ***    {Document critical care time when appropriate:1} {Document review of labs and clinical decision tools ie heart score, Chads2Vasc2 etc:1}  {Document your independent review of radiology images, and any outside records:1} {Document your discussion with family members, caretakers, and with consultants:1} {Document social determinants of health affecting pt's care:1} {Document your decision making why or why not admission, treatments were needed:1} Final Clinical Impression(s) / ED Diagnoses Final diagnoses:  None    Rx / DC Orders ED Discharge Orders     None

## 2021-12-21 NOTE — Discharge Instructions (Addendum)
You are seen in the emergency department for continued abdominal pain.  You had blood work and urinalysis, CAT scan of your abdomen and pelvis.  There was still signs of urinary tract infection and we are putting you back on antibiotics.  You also had some thickening of your small intestine that will need follow-up with your GI doctor.  Please return to the emergency department if any worsening or concerning symptoms.

## 2021-12-21 NOTE — ED Triage Notes (Signed)
Generalized abdominal pain, n/v/d since 7/1.  Describes pain as constant, aching.  Seen on 7/2 for same - no improvement.  7/2 abdomen ct states "Focal wall thickening and slight distension of proximal small bowel with collapse of the more distal small bowel. This may represent a focal ileus."

## 2021-12-22 ENCOUNTER — Other Ambulatory Visit: Payer: Self-pay | Admitting: Specialist

## 2021-12-22 DIAGNOSIS — M4807 Spinal stenosis, lumbosacral region: Secondary | ICD-10-CM

## 2021-12-22 NOTE — Telephone Encounter (Signed)
Patient called in stating the Tramadol is not working very well and she would like the hydrocodone refilled please advise

## 2021-12-23 MED ORDER — HYDROCODONE-ACETAMINOPHEN 10-325 MG PO TABS: 0.5000 | ORAL_TABLET | Freq: Three times a day (TID) | ORAL | 0 refills | Status: DC

## 2021-12-23 NOTE — Addendum Note (Signed)
Addended by: Penne Lash, Otis Dials on: 12/23/2021 10:34 AM   Modules accepted: Orders

## 2021-12-25 LAB — URINE CULTURE: Culture: >=100000 — AB

## 2021-12-26 ENCOUNTER — Telehealth: Payer: Self-pay

## 2021-12-26 NOTE — Telephone Encounter (Signed)
Post ED Visit - Positive Culture Follow-up  Culture report reviewed by antimicrobial stewardship pharmacist: [facility] []  , Pharm.D. []  Enzo Bi, Pharm.D., BCPS AQ-ID []  , Pharm.D., BCPS []  Celedonio Miyamoto, .D., BCPS []  [place], .D., BCPS, AAHIVP []  Georgina Pillion, Pharm.D., BCPS, AAHIVP [x]  [address], PharmD, BCPS []  , PharmD, BCPS []  [place], PharmD, BCPS []  [address], PharmD []  , PharmD, BCPS []  Estella Husk, PharmD  Pharmacy Team []  Lysle Pearl, PharmD []  , PharmD []  Phillips Climes, PharmD []  , Rph []  Agapito Games) , PharmD []  Verlan Friends, PharmD []  , PharmD []  Mervyn Gay, PharmD []  , PharmD []  Vinnie Level, PharmD []  [facility], PharmD []  , PharmD []  Len Childs, PharmD   Positive urine culture Treated with Cephalexin, organism sensitive to the same and no further patient follow-up is required at this time.  12/26/2021, 9:25 AM

## 2021-12-28 ENCOUNTER — Other Ambulatory Visit: Payer: Self-pay | Admitting: Specialist

## 2021-12-28 DIAGNOSIS — M4807 Spinal stenosis, lumbosacral region: Secondary | ICD-10-CM

## 2021-12-28 MED ORDER — HYDROCODONE-ACETAMINOPHEN 10-325 MG PO TABS
0.5000 | ORAL_TABLET | Freq: Three times a day (TID) | ORAL | 0 refills | Status: DC
Start: 2021-12-28 — End: 2022-01-04

## 2021-12-28 NOTE — Telephone Encounter (Signed)
Patient had to cancel her appointment for tomorrow with [name] due to a bad UTI that shes had over a month, is requesting a refill on her hydrocodone. CB # [phone]

## 2021-12-29 ENCOUNTER — Ambulatory Visit: Payer: Medicare HMO | Admitting: Specialist

## 2022-01-04 ENCOUNTER — Other Ambulatory Visit: Payer: Self-pay | Admitting: Specialist

## 2022-01-04 DIAGNOSIS — M130 Polyarthritis, unspecified: Secondary | ICD-10-CM

## 2022-01-04 DIAGNOSIS — M4807 Spinal stenosis, lumbosacral region: Secondary | ICD-10-CM

## 2022-01-04 MED ORDER — HYDROCODONE-ACETAMINOPHEN 10-325 MG PO TABS: 0.5000 | ORAL_TABLET | Freq: Three times a day (TID) | ORAL | 0 refills | Status: DC

## 2022-01-04 NOTE — Telephone Encounter (Signed)
Pt called requesting a refill of pain medication. Please send to pharmacy on file. Pt phone number is [phone]

## 2022-01-12 ENCOUNTER — Other Ambulatory Visit: Payer: Self-pay | Admitting: Surgery

## 2022-01-12 ENCOUNTER — Telehealth: Payer: Self-pay | Admitting: Specialist

## 2022-01-12 NOTE — Telephone Encounter (Signed)
Patient called. She would like a refill on hydrocodone °

## 2022-01-13 ENCOUNTER — Other Ambulatory Visit: Payer: Self-pay | Admitting: Surgery

## 2022-01-13 ENCOUNTER — Telehealth: Payer: Self-pay | Admitting: Surgery

## 2022-01-13 MED ORDER — TRAMADOL HCL 50 MG PO TABS: 50.0000 mg | ORAL_TABLET | Freq: Two times a day (BID) | ORAL | 0 refills | Status: DC | PRN

## 2022-01-13 NOTE — Telephone Encounter (Signed)
Pt called requesting a refill of pain medication. Pt went to pharmacy and it has not be sent. Please call pt when medication has been sent in. Pt phone number is [phone]

## 2022-01-13 NOTE — Telephone Encounter (Signed)
Message in chart states you sent in Tramadol, no rx in the system. Could you please send?

## 2022-01-13 NOTE — Telephone Encounter (Signed)
I called patient and advised. 

## 2022-01-23 ENCOUNTER — Ambulatory Visit: Payer: Self-pay

## 2022-01-23 ENCOUNTER — Encounter: Payer: Self-pay | Admitting: Specialist

## 2022-01-23 ENCOUNTER — Ambulatory Visit (INDEPENDENT_AMBULATORY_CARE_PROVIDER_SITE_OTHER): Payer: Medicare HMO | Admitting: Specialist

## 2022-01-23 VITALS — BP 120/66 | HR 73 | Ht 63.0 in | Wt 101.4 lb

## 2022-01-23 DIAGNOSIS — M47816 Spondylosis without myelopathy or radiculopathy, lumbar region: Secondary | ICD-10-CM | POA: Diagnosis not present

## 2022-01-23 DIAGNOSIS — M25562 Pain in left knee: Secondary | ICD-10-CM

## 2022-01-23 DIAGNOSIS — M4726 Other spondylosis with radiculopathy, lumbar region: Secondary | ICD-10-CM

## 2022-01-23 DIAGNOSIS — M7051 Other bursitis of knee, right knee: Secondary | ICD-10-CM

## 2022-01-23 DIAGNOSIS — M25561 Pain in right knee: Secondary | ICD-10-CM

## 2022-01-23 DIAGNOSIS — M4807 Spinal stenosis, lumbosacral region: Secondary | ICD-10-CM

## 2022-01-23 DIAGNOSIS — M7052 Other bursitis of knee, left knee: Secondary | ICD-10-CM

## 2022-01-23 DIAGNOSIS — M461 Sacroiliitis, not elsewhere classified: Secondary | ICD-10-CM

## 2022-01-23 MED ORDER — HYDROCODONE-ACETAMINOPHEN 10-325 MG PO TABS
0.5000 | ORAL_TABLET | Freq: Three times a day (TID) | ORAL | 0 refills | Status: DC
Start: 2022-01-23 — End: 2022-01-31

## 2022-01-23 NOTE — Addendum Note (Signed)
Addended by: Vira Browns on: 01/23/2022 05:19 PM   Modules accepted: Orders

## 2022-01-23 NOTE — Patient Instructions (Addendum)
Plan: Avoid frequent bending and stooping  No lifting greater than 10 lbs. May use ice or moist heat for pain. Weight loss is of benefit. Best medication for lumbar disc disease is arthritis medications like motrin, celebrex and naprosyn. Exercise is important to improve your indurance and does allow people to function better inspite of back pain. Well padded shoes help. Ice the knee 2-3 times a day 15-20 mins at a time.3 times a day 15-20 mins at a time. Hot showers in the AM.  Injection with steroid may be of benefit. Hemp CBD capsules, amazon.com 5,000-7,000 mg per bottle, 60 capsules per bottle, take one capsule twice a day. Cane in the left hand to use with left leg weight bearing. Follow-Up Instructions: No follow-ups on file.  I believe a 2 level fusion L4-5, L5-S1 is likely to be of more benefit than the single level left sided L4-5 hemilaminectomy recommended. In 09/2021, mainly because your pain is not consistent with nerve compression alone and is bilateral whether you are sitting or standing and walking. This suggests that there is mechanical as well as neurogenic cause for pain.

## 2022-01-23 NOTE — Progress Notes (Signed)
Office Visit Note   Patient: Patricia Sutton           Date of Birth: 1946-02-05           MRN: [id number] Visit Date: 01/23/2022              Requested by: Yolanda Manges, DO [address] [place],  [place] [address] PCP: Yolanda Manges, DO   Assessment & Plan: Visit Diagnoses:  1. Pain in both knees, unspecified chronicity   2. Spondylosis without myelopathy or radiculopathy, lumbar region   3. Pes anserinus bursitis of both knees   4. Other spondylosis with radiculopathy, lumbar region   5. Spinal stenosis of lumbosacral region   6. Sacroiliitis (HCC)     Plan: Avoid frequent bending and stooping  No lifting greater than 10 lbs. May use ice or moist heat for pain. Weight loss is of benefit. Best medication for lumbar disc disease is arthritis medications like motrin, celebrex and naprosyn. Exercise is important to improve your indurance and does allow people to function better inspite of back pain. Well padded shoes help. Ice the knee 2-3 times a day 15-20 mins at a time.3 times a day 15-20 mins at a time. Hot showers in the AM.  Injection with steroid may be of benefit. Hemp CBD capsules, amazon[employer]com 5,000-7,000 mg per bottle, 60 capsules per bottle, take one capsule twice a day. Cane in the left hand to use with left leg weight bearing. Follow-Up Instructions: No follow-ups on file.  I believe a 2 level fusion is likely to be of more benefit than the single level left sided hemilaminectomy recommended In 09/2021, mainly because your pain is not consistent with nerve compression alone and is bilateral whether you are sitting or standing and walking. This suggests that there is mechanical as well as neurogenic cause for pain.   Follow-Up Instructions: No follow-ups on file.   Orders:  Orders Placed This Encounter  Procedures  . XR KNEE 3 VIEW LEFT  . XR KNEE 3 VIEW RIGHT  . NM Bone Scan Whole Body   No orders of the defined types were placed in this  encounter.     Procedures: No procedures performed   Clinical Data: No additional findings.   Subjective: Chief Complaint  Patient presents with  . Lower Back - Follow-up    76 year old female with history of   Review of Systems  Constitutional: Negative.   HENT: Negative.    Eyes: Negative.   Respiratory: Negative.    Cardiovascular: Negative.   Gastrointestinal: Negative.   Endocrine: Negative.   Genitourinary: Negative.   Musculoskeletal: Negative.   Skin: Negative.   Allergic/Immunologic: Negative.   Neurological: Negative.   Hematological: Negative.   Psychiatric/Behavioral: Negative.       Objective: Vital Signs: BP 120/66 (BP Location: Left Arm, Patient Position: Sitting)   Pulse 73   Ht 5\' 3"  (1.6 m)   Wt 101 lb 6.4 oz (46 kg)   BMI 17.96 kg/m   Physical Exam Constitutional:      Appearance: She is well-developed.  HENT:     Head: Normocephalic and atraumatic.  Eyes:     Pupils: Pupils are equal, round, and reactive to light.  Pulmonary:     Effort: Pulmonary effort is normal.     Breath sounds: Normal breath sounds.  Abdominal:     General: Bowel sounds are normal.     Palpations: Abdomen is soft.  Musculoskeletal:     Cervical back: Normal range of motion and neck supple.     Lumbar back: Negative right straight leg raise test and negative left straight leg raise test.  Skin:    General: Skin is warm and dry.  Neurological:     Mental Status: She is alert and oriented to person, place, and time.  Psychiatric:        Behavior: Behavior normal.        Thought Content: Thought content normal.        Judgment: Judgment normal.    Back Exam   Tenderness  The patient is experiencing tenderness in the lumbar.  Range of Motion  Extension:  abnormal  Flexion:  abnormal  Lateral bend right:  abnormal  Lateral bend left:  abnormal  Rotation right:  abnormal  Rotation left:  abnormal   Muscle Strength  Right Quadriceps:  5/5  Left  Quadriceps:  5/5  Right Hamstrings:  5/5  Left Hamstrings:  5/5   Tests  Straight leg raise right: negative Straight leg raise left: negative  Reflexes  Patellar:  2/4 Achilles:  2/4  Other  Toe walk: normal Heel walk: normal Erythema: no back redness Scars: absent     Specialty Comments:  MRI LUMBAR SPINE WITHOUT AND WITH CONTRAST     TECHNIQUE:  Multiplanar and multiecho pulse sequences of the lumbar spine were  obtained without and with intravenous contrast.     CONTRAST:  75mL MULTIHANCE GADOBENATE DIMEGLUMINE 529 MG/ML IV SOLN     COMPARISON:  Radiography 01/22/2018.  MRI 05/11/2017.     FINDINGS:  Segmentation: 5 lumbar type vertebral bodies as numbered previously.     Alignment:  Normal except for 1 mm of anterolisthesis at L5-S1.     Vertebrae:  No fracture or primary bone lesion.     Conus medullaris and cauda equina: Conus extends to the L1 level.  Conus and cauda equina appear normal.     Paraspinal and other soft tissues: Negative     Disc levels:     T12-L1: Shallow left paracentral disc protrusion indents the thecal  sac slightly but does not cause any neural compression. No change  since the previous study.     L1-2 and L2-3: Normal disc spirit mild facet hypertrophy without  edema or stenosis.     L3-4: Minimal bulging of the disc. Minimal facet hypertrophy. No  stenosis.     L4-5: Mild bulging of the disc. Bilateral facet degeneration and  hypertrophy. No compressive stenosis. Findings could contribute to  back pain or referred facet syndrome pain.     L5-S1: 1 mm anterolisthesis. No disc abnormality. Bilateral facet  degeneration worse on the left. No sign of neural compression.  Findings could relate to back pain or referred facet syndrome pain.     Compared to the study of 2018, the findings are quite similar.     IMPRESSION:  No change since 2018. No evidence of stenosis or neural compression.  Lower lumbar degenerative changes,  most notably that of facet  degenerative disease at L4-5 and L5-S1 which could contribute to  back pain or referred facet syndrome pain. Single most abnormal  facet joint is on the left at L5-S1.        Electronically Signed    By: Paulina Fusi M.D.    On: 08/10/2018 11:58  Imaging: No results found.   PMFS History: Patient Active Problem List   Diagnosis Date Noted  .  Osteoarthritis of right knee 07/18/2013    Priority: High    Class: Chronic  . Loose body of right knee 07/16/2012    Priority: High    Class: Chronic  . Decreased activities of daily living (ADL) 05/02/2020  . Oxygen dependent 05/02/2020  . Stroke (cerebrum) (HCC) 10/16/2019  . Centrilobular emphysema (HCC) 07/03/2018  . Acute right-sided low back pain without sciatica 01/29/2018  . Anxiety 04/16/2017  . DDD (degenerative disc disease), lumbar 12/29/2016  . Nocturnal hypoxemia 09/28/2016  . Smoking greater than 40 pack years 09/28/2016  . Tobacco use disorder 03/15/2015  . UTI (lower urinary tract infection) 03/15/2015  . COPD (chronic obstructive pulmonary disease) (HCC) 03/15/2015  . Hypothyroidism 03/15/2015  . Chronic pain syndrome 03/15/2015  . Dehydration 03/15/2015  . Elevated troponin 03/14/2015  . Chronic hepatitis C (HCC) 11/02/2012  . Constipation 11/02/2012   Past Medical History:  Diagnosis Date  . Anxiety   . Arthritis   . Asthma   . Back pain, chronic   . Bone spur of other site    spine  . Chronic back pain    spurs on spine  . COPD (chronic obstructive pulmonary disease) (HCC)    denies SOB  . Depression    takes Effexor daily  . GERD (gastroesophageal reflux disease)    takes Omeprazole daily  . History of bronchitis    last time about 6+yrs ago  . History of colon polyps   . History of kidney stones   . Hypothyroidism    takes Synthroid daily  . Insomnia    takes Trazodone nightly and Ambien  . Joint pain   . Joint swelling   . Nocturia   . Pneumonia    hx  of;last time about 6+yrs ago  . PONV (postoperative nausea and vomiting)   . Renal insufficiency   . Sleep apnea    no CPAP; uses O2 at night 1l/min.  Marland Kitchen Urinary urgency     Family History  Problem Relation Age of Onset  . Heart attack Mother   . Stroke Mother   . Diabetes Mellitus II Mother   . Lymphoma Father     Past Surgical History:  Procedure Laterality Date  . ABDOMINAL HYSTERECTOMY     complete  . bladder tacked    . COLONOSCOPY    . ESOPHAGOGASTRODUODENOSCOPY    . KNEE ARTHROPLASTY Right 07/18/2013   Procedure: COMPUTER ASSISTED RIGHT TOTAL KNEE ARTHROPLASTY;  Surgeon: Kerrin Champagne, MD;  Location: [facility] OR;  Service: Orthopedics;  Laterality: Right;  . KNEE ARTHROSCOPY Right 07/16/2012   Procedure: ARTHROSCOPY KNEE;  Surgeon: Kerrin Champagne, MD;  Location: [facility];  Service: Orthopedics;  Laterality: Right;  Right knee arthroscopy with chondroplastic shaving of retropatella surface   Social History   Occupational History  . Not on file  Tobacco Use  . Smoking status: Some Days    Packs/day: 1.00    Years: 40.00    Total pack years: 40.00    Types: Cigarettes  . Smokeless tobacco: Never  Vaping Use  . Vaping Use: Some days  Substance and Sexual Activity  . Alcohol use: No  . Drug use: No  . Sexual activity: Not on file

## 2022-01-26 ENCOUNTER — Ambulatory Visit (HOSPITAL_COMMUNITY)
Admission: RE | Admit: 2022-01-26 | Discharge: 2022-01-26 | Disposition: A | Discharge status: Home / Self Care | Payer: Medicare HMO | Source: Ambulatory Visit | Attending: Specialist | Admitting: Specialist

## 2022-01-26 DIAGNOSIS — M25562 Pain in left knee: Secondary | ICD-10-CM | POA: Insufficient documentation

## 2022-01-26 DIAGNOSIS — M25561 Pain in right knee: Secondary | ICD-10-CM | POA: Insufficient documentation

## 2022-01-26 MED ORDER — TECHNETIUM TC 99M MEDRONATE IV KIT
20.0000 | PACK | Freq: Once | INTRAVENOUS | Status: AC | PRN
  Administered 2022-01-26: 21.6 via INTRAVENOUS

## 2022-01-31 ENCOUNTER — Other Ambulatory Visit: Payer: Self-pay | Admitting: Specialist

## 2022-01-31 DIAGNOSIS — M4807 Spinal stenosis, lumbosacral region: Secondary | ICD-10-CM

## 2022-01-31 NOTE — Telephone Encounter (Signed)
Patient called. She would like a refill on hydrocodone. Also she would like to know the results of her scan. Her call back number is [phone]

## 2022-02-01 ENCOUNTER — Telehealth: Payer: Self-pay | Admitting: Specialist

## 2022-02-01 MED ORDER — HYDROCODONE-ACETAMINOPHEN 10-325 MG PO TABS: 0.5000 | ORAL_TABLET | Freq: Three times a day (TID) | ORAL | 0 refills | Status: AC

## 2022-02-01 NOTE — Telephone Encounter (Signed)
Pt called requesting a refill of pain medication. Also calling about a scan that was sent as a referral. Please call [phone].

## 2022-02-03 ENCOUNTER — Ambulatory Visit (INDEPENDENT_AMBULATORY_CARE_PROVIDER_SITE_OTHER): Payer: Medicare HMO | Admitting: Specialist

## 2022-02-03 ENCOUNTER — Encounter: Payer: Self-pay | Admitting: Specialist

## 2022-02-03 VITALS — BP 137/65 | HR 66 | Ht 63.0 in | Wt 101.8 lb

## 2022-02-03 DIAGNOSIS — M130 Polyarthritis, unspecified: Secondary | ICD-10-CM

## 2022-02-03 DIAGNOSIS — M25562 Pain in left knee: Secondary | ICD-10-CM

## 2022-02-03 DIAGNOSIS — M47816 Spondylosis without myelopathy or radiculopathy, lumbar region: Secondary | ICD-10-CM | POA: Diagnosis not present

## 2022-02-03 DIAGNOSIS — M1712 Unilateral primary osteoarthritis, left knee: Secondary | ICD-10-CM | POA: Diagnosis not present

## 2022-02-03 DIAGNOSIS — M4807 Spinal stenosis, lumbosacral region: Secondary | ICD-10-CM

## 2022-02-03 DIAGNOSIS — M25561 Pain in right knee: Secondary | ICD-10-CM

## 2022-02-03 DIAGNOSIS — M4726 Other spondylosis with radiculopathy, lumbar region: Secondary | ICD-10-CM

## 2022-02-03 DIAGNOSIS — M48062 Spinal stenosis, lumbar region with neurogenic claudication: Secondary | ICD-10-CM

## 2022-02-03 DIAGNOSIS — M7051 Other bursitis of knee, right knee: Secondary | ICD-10-CM | POA: Diagnosis not present

## 2022-02-03 DIAGNOSIS — M7052 Other bursitis of knee, left knee: Secondary | ICD-10-CM

## 2022-02-03 DIAGNOSIS — M4815 Ankylosing hyperostosis [Forestier], thoracolumbar region: Secondary | ICD-10-CM

## 2022-02-03 DIAGNOSIS — M461 Sacroiliitis, not elsewhere classified: Secondary | ICD-10-CM

## 2022-02-03 DIAGNOSIS — R202 Paresthesia of skin: Secondary | ICD-10-CM

## 2022-02-03 MED ORDER — BUPIVACAINE HCL 0.5 % IJ SOLN
3.0000 mL | INTRAMUSCULAR | Status: AC | PRN
  Administered 2022-02-03: 3 mL via INTRA_ARTICULAR

## 2022-02-03 MED ORDER — HYDROCODONE-ACETAMINOPHEN 5-325 MG PO TABS: 1.0000 | ORAL_TABLET | Freq: Four times a day (QID) | ORAL | 0 refills | Status: DC | PRN

## 2022-02-03 MED ORDER — METHYLPREDNISOLONE ACETATE 40 MG/ML IJ SUSP
40.0000 mg | INTRAMUSCULAR | Status: AC | PRN
  Administered 2022-02-03: 40 mg via INTRA_ARTICULAR

## 2022-02-03 NOTE — Patient Instructions (Addendum)
  Plan: Avoid bending, stooping and avoid lifting weights greater than 10 lbs. Avoid prolong standing and walking. Avoid frequent bending and stooping  No lifting greater than 10 lbs. May use ice or moist heat for pain. Weight loss is of benefit. Handicap license is approved. Order or Rx for foldable aluminum walker with tennis ba

## 2022-02-03 NOTE — Progress Notes (Addendum)
Office Visit Note   Patient: Patricia Sutton           Date of Birth: [birth year]           MRN: [id number] Visit Date: 02/03/2022              Requested by: Francesca Oman, DO [id number] [place],  [place] [address] PCP: Francesca Oman, DO   Assessment & Plan: Visit Diagnoses:  1. Spondylosis without myelopathy or radiculopathy, lumbar region   2. Pes anserinus bursitis of both knees   3. Other spondylosis with radiculopathy, lumbar region   4. Spinal stenosis of lumbosacral region   5. Sacroiliitis ([place])   6. Polyarticular arthritis   7. Spinal stenosis, lumbar region with neurogenic claudication   8. Paresthesia of skin   9. Forestier's disease of thoracolumbar region   10. Pain in both knees, unspecified chronicity     Plan: Avoid bending, stooping and avoid lifting weights greater than 10 lbs. Avoid prolong standing and walking. Avoid frequent bending and stooping  No lifting greater than 10 lbs. May use ice or moist heat for pain. Weight loss is of benefit. Handicap license is approved. Order or Rx for foldable aluminum walker with tennis ba  Follow-Up Instructions: No follow-ups on file.   Orders:  No orders of the defined types were placed in this encounter.  No orders of the defined types were placed in this encounter.     Procedures: Large Joint Inj: L knee on 02/03/2022 1:11 PM Indications: pain Details: 25 G 1.5 in needle, anterolateral approach  Arthrogram: No  Medications: 40 mg methylPREDNISolone acetate 40 MG/ML; 3 mL bupivacaine 0.5 % Outcome: tolerated well, no immediate complications Procedure, treatment alternatives, risks and benefits explained, specific risks discussed. Consent was given by the patient. Immediately prior to procedure a time out was called to verify the correct patient, procedure, equipment, support staff and site/side marked as required. Patient was prepped and draped in the usual sterile fashion.    Large  Joint Inj: R knee on 02/03/2022 1:14 PM Indications: pain Details: 25 G 1.5 in needle, medial approach  Arthrogram: No  Medications: 40 mg methylPREDNISolone acetate 40 MG/ML; 3 mL bupivacaine 0.5 % Outcome: tolerated well, no immediate complications Procedure, treatment alternatives, risks and benefits explained, specific risks discussed. Consent was given by the patient. Immediately prior to procedure a time out was called to verify the correct patient, procedure, equipment, support staff and site/side marked as required. Patient was prepped and draped in the usual sterile fashion.     Clinical Data: No additional findings.   Subjective: Chief Complaint  Patient presents with  . Right Knee - Pain  . Left Knee - Pain  . Lower Back - Pain    HPI  Review of Systems   Objective: Vital Signs: BP 137/65 (BP Location: Left Arm, Patient Position: Sitting)   Pulse 66   Ht 5\' 3"  (1.6 m)   Wt 101 lb 12.8 oz (46.2 kg)   BMI 18.03 kg/m   Physical Exam  Ortho Exam  Specialty Comments:  MRI LUMBAR SPINE WITHOUT AND WITH CONTRAST     TECHNIQUE:  Multiplanar and multiecho pulse sequences of the lumbar spine were  obtained without and with intravenous contrast.     CONTRAST:  43mL MULTIHANCE GADOBENATE DIMEGLUMINE 529 MG/ML IV SOLN     COMPARISON:  Radiography 01/22/2018.  MRI 05/11/2017.     FINDINGS:  Segmentation: 5 lumbar type  vertebral bodies as numbered previously.     Alignment:  Normal except for 1 mm of anterolisthesis at L5-S1.     Vertebrae:  No fracture or primary bone lesion.     Conus medullaris and cauda equina: Conus extends to the L1 level.  Conus and cauda equina appear normal.     Paraspinal and other soft tissues: Negative     Disc levels:     T12-L1: Shallow left paracentral disc protrusion indents the thecal  sac slightly but does not cause any neural compression. No change  since the previous study.     L1-2 and L2-3: Normal disc spirit mild  facet hypertrophy without  edema or stenosis.     L3-4: Minimal bulging of the disc. Minimal facet hypertrophy. No  stenosis.     L4-5: Mild bulging of the disc. Bilateral facet degeneration and  hypertrophy. No compressive stenosis. Findings could contribute to  back pain or referred facet syndrome pain.     L5-S1: 1 mm anterolisthesis. No disc abnormality. Bilateral facet  degeneration worse on the left. No sign of neural compression.  Findings could relate to back pain or referred facet syndrome pain.     Compared to the study of 2018, the findings are quite similar.     IMPRESSION:  No change since 2018. No evidence of stenosis or neural compression.  Lower lumbar degenerative changes, most notably that of facet  degenerative disease at L4-5 and L5-S1 which could contribute to  back pain or referred facet syndrome pain. Single most abnormal  facet joint is on the left at L5-S1.        Electronically Signed    By: Paulina Fusi M.D.    On: 08/10/2018 11:58  Imaging: No results found.   PMFS History: Patient Active Problem List   Diagnosis Date Noted  . Osteoarthritis of right knee 07/18/2013    Priority: High    Class: Chronic  . Loose body of right knee 07/16/2012    Priority: High    Class: Chronic  . Decreased activities of daily living (ADL) 05/02/2020  . Oxygen dependent 05/02/2020  . Stroke (cerebrum) (HCC) 10/16/2019  . Centrilobular emphysema (HCC) 07/03/2018  . Acute right-sided low back pain without sciatica 01/29/2018  . Anxiety 04/16/2017  . DDD (degenerative disc disease), lumbar 12/29/2016  . Nocturnal hypoxemia 09/28/2016  . Smoking greater than 40 pack years 09/28/2016  . Tobacco use disorder 03/15/2015  . UTI (lower urinary tract infection) 03/15/2015  . COPD (chronic obstructive pulmonary disease) (HCC) 03/15/2015  . Hypothyroidism 03/15/2015  . Chronic pain syndrome 03/15/2015  . Dehydration 03/15/2015  . Elevated troponin 03/14/2015  .  Chronic hepatitis C (HCC) 11/02/2012  . Constipation 11/02/2012   Past Medical History:  Diagnosis Date  . Anxiety   . Arthritis   . Asthma   . Back pain, chronic   . Bone spur of other site    spine  . Chronic back pain    spurs on spine  . COPD (chronic obstructive pulmonary disease) (HCC)    denies SOB  . Depression    takes Effexor daily  . GERD (gastroesophageal reflux disease)    takes Omeprazole daily  . History of bronchitis    last time about 6+yrs ago  . History of colon polyps   . History of kidney stones   . Hypothyroidism    takes Synthroid daily  . Insomnia    takes Trazodone nightly and Ambien  . Joint pain   .  Joint swelling   . Nocturia   . Pneumonia    hx of;last time about 6+yrs ago  . PONV (postoperative nausea and vomiting)   . Renal insufficiency   . Sleep apnea    no CPAP; uses O2 at night 1l/min.  Marland Kitchen Urinary urgency     Family History  Problem Relation Age of Onset  . Heart attack Mother   . Stroke Mother   . Diabetes Mellitus II Mother   . Lymphoma Father     Past Surgical History:  Procedure Laterality Date  . ABDOMINAL HYSTERECTOMY     complete  . bladder tacked    . COLONOSCOPY    . ESOPHAGOGASTRODUODENOSCOPY    . KNEE ARTHROPLASTY Right 07/18/2013   Procedure: COMPUTER ASSISTED RIGHT TOTAL KNEE ARTHROPLASTY;  Surgeon: Kerrin Champagne, MD;  Location: [facility] OR;  Service: Orthopedics;  Laterality: Right;  . KNEE ARTHROSCOPY Right 07/16/2012   Procedure: ARTHROSCOPY KNEE;  Surgeon: Kerrin Champagne, MD;  Location: [facility];  Service: Orthopedics;  Laterality: Right;  Right knee arthroscopy with chondroplastic shaving of retropatella surface   Social History   Occupational History  . Not on file  Tobacco Use  . Smoking status: Some Days    Packs/day: 1.00    Years: 40.00    Total pack years: 40.00    Types: Cigarettes  . Smokeless tobacco: Never  Vaping Use  . Vaping Use: Some days  Substance and Sexual Activity  .  Alcohol use: No  . Drug use: No  . Sexual activity: Not on file

## 2022-02-15 ENCOUNTER — Other Ambulatory Visit: Payer: Self-pay | Admitting: Specialist

## 2022-02-15 MED ORDER — HYDROCODONE-ACETAMINOPHEN 5-325 MG PO TABS: 1.0000 | ORAL_TABLET | Freq: Three times a day (TID) | ORAL | 0 refills | Status: AC

## 2022-02-15 NOTE — Telephone Encounter (Signed)
Refill on hydrocodone

## 2022-02-15 NOTE — Addendum Note (Signed)
Addended by: Guy Begin on: 02/15/2022 09:36 AM   Modules accepted: Orders

## 2022-02-23 ENCOUNTER — Ambulatory Visit: Payer: Medicare HMO | Admitting: Specialist

## 2022-02-28 ENCOUNTER — Telehealth: Payer: Self-pay | Admitting: Specialist

## 2022-03-03 ENCOUNTER — Other Ambulatory Visit: Payer: Self-pay | Admitting: Specialist

## 2022-03-03 NOTE — Telephone Encounter (Signed)
Hydrocodone last filled 02/15/22 #21

## 2022-03-03 NOTE — Telephone Encounter (Signed)
Pt needs refill on hydrocodone send to [facility] on [address] [place]

## 2022-03-06 ENCOUNTER — Other Ambulatory Visit: Payer: Self-pay | Admitting: Radiology

## 2022-03-06 ENCOUNTER — Telehealth: Payer: Self-pay | Admitting: Specialist

## 2022-03-06 DIAGNOSIS — M4815 Ankylosing hyperostosis [Forestier], thoracolumbar region: Secondary | ICD-10-CM

## 2022-03-06 DIAGNOSIS — M130 Polyarthritis, unspecified: Secondary | ICD-10-CM

## 2022-03-06 DIAGNOSIS — M48062 Spinal stenosis, lumbar region with neurogenic claudication: Secondary | ICD-10-CM

## 2022-03-06 DIAGNOSIS — M47816 Spondylosis without myelopathy or radiculopathy, lumbar region: Secondary | ICD-10-CM

## 2022-03-06 DIAGNOSIS — R202 Paresthesia of skin: Secondary | ICD-10-CM

## 2022-03-06 DIAGNOSIS — M461 Sacroiliitis, not elsewhere classified: Secondary | ICD-10-CM

## 2022-03-06 DIAGNOSIS — M4807 Spinal stenosis, lumbosacral region: Secondary | ICD-10-CM

## 2022-03-06 DIAGNOSIS — M25561 Pain in right knee: Secondary | ICD-10-CM

## 2022-03-06 DIAGNOSIS — M4726 Other spondylosis with radiculopathy, lumbar region: Secondary | ICD-10-CM

## 2022-03-06 DIAGNOSIS — M1712 Unilateral primary osteoarthritis, left knee: Secondary | ICD-10-CM

## 2022-03-06 DIAGNOSIS — M7051 Other bursitis of knee, right knee: Secondary | ICD-10-CM

## 2022-03-06 NOTE — Telephone Encounter (Signed)
Pt called requesting her referral be sent to [facility]  on [address]. Dr. Ileene Rubens Rheumatologist. Phone number [phone]. Pt states its closer to her h ome. Pt phone number is [phone].

## 2022-03-06 NOTE — Telephone Encounter (Signed)
Pt states she is completely out of pain meds

## 2022-03-06 NOTE — Telephone Encounter (Signed)
New referral placed.

## 2022-03-08 NOTE — Telephone Encounter (Signed)
Patient called in again about medication refill for Hydrocodone please advise

## 2022-03-09 NOTE — Telephone Encounter (Signed)
I called and advised that her rx was still pending with Dr. Louanne Skye. She states that she has been out and that she is in pain.

## 2022-03-13 ENCOUNTER — Ambulatory Visit: Payer: Medicare HMO | Admitting: Specialist

## 2022-03-16 ENCOUNTER — Telehealth: Payer: Self-pay | Admitting: Specialist

## 2022-03-16 NOTE — Telephone Encounter (Signed)
Pt's daughter Patricia Sutton called requesting referral be sent to change of care for pt. First referral will be for pain management at [facility] Dr. D[facility] address [address] [place] [address]. And second referral for Ortho Office of Dr. Rennis Harding [facility] address [address] [place] [address]. Please send these referrals as STAT pt's daughter request. She states pt is in severe pain and really need pain management as soon as possible. Pt's daughter Patricia Sutton phone number is [phone].

## 2022-03-16 NOTE — Telephone Encounter (Signed)
Please advise on referrals. 

## 2022-03-20 ENCOUNTER — Other Ambulatory Visit: Payer: Self-pay | Admitting: Radiology

## 2022-03-20 DIAGNOSIS — M4815 Ankylosing hyperostosis [Forestier], thoracolumbar region: Secondary | ICD-10-CM

## 2022-03-20 DIAGNOSIS — M5416 Radiculopathy, lumbar region: Secondary | ICD-10-CM

## 2022-03-20 DIAGNOSIS — M25561 Pain in right knee: Secondary | ICD-10-CM

## 2022-03-20 DIAGNOSIS — G8929 Other chronic pain: Secondary | ICD-10-CM

## 2022-03-20 DIAGNOSIS — M4726 Other spondylosis with radiculopathy, lumbar region: Secondary | ICD-10-CM

## 2022-03-20 DIAGNOSIS — R202 Paresthesia of skin: Secondary | ICD-10-CM

## 2022-03-20 DIAGNOSIS — M1712 Unilateral primary osteoarthritis, left knee: Secondary | ICD-10-CM

## 2022-03-20 DIAGNOSIS — M130 Polyarthritis, unspecified: Secondary | ICD-10-CM

## 2022-03-20 DIAGNOSIS — M7051 Other bursitis of knee, right knee: Secondary | ICD-10-CM

## 2022-03-20 DIAGNOSIS — M47816 Spondylosis without myelopathy or radiculopathy, lumbar region: Secondary | ICD-10-CM

## 2022-03-20 DIAGNOSIS — M48062 Spinal stenosis, lumbar region with neurogenic claudication: Secondary | ICD-10-CM

## 2022-03-20 DIAGNOSIS — M4807 Spinal stenosis, lumbosacral region: Secondary | ICD-10-CM

## 2022-03-20 DIAGNOSIS — M461 Sacroiliitis, not elsewhere classified: Secondary | ICD-10-CM

## 2022-03-20 NOTE — Telephone Encounter (Signed)
Orders placed.

## 2022-03-21 NOTE — Telephone Encounter (Signed)
Pt's daughter Bailey Mech called stating that pt was seen by Dr. Louanne Skye.... Pt daughter stated that Dr. Louanne Skye was suppose to send over two specialist referrals to two different place.... Pt stated that the first referral is suppose to go to Pain Management at [facility] to Dr. Darcel Bayley at [place] [address].... Pt daughter stated that the second referral was suppose to be sent to Rheumatology to Dr. Hermelinda Medicus at [address] [place], [place]  Phone [phone].... Pt daughter is requesting the referrals to be sent over as STAT.... Pt daughter stated that pt is in severe pain and really needs the referral  to schedule appt... Pt daughter requesting callback at [phone].

## 2022-03-23 NOTE — Telephone Encounter (Signed)
Lmom advised that all referrals have been sent to the requested facilities and the offices have to review the info and they will be calling to get her scheduled, that they can call the offices and possibly speed the process.

## 2022-03-29 NOTE — Telephone Encounter (Signed)
Referral has been made to pain management, Dr. Patrice Paradise, and Rheumatology, per patients daughter request.

## 2022-06-03 DIAGNOSIS — I4891 Unspecified atrial fibrillation: Secondary | ICD-10-CM | POA: Insufficient documentation

## 2022-09-16 ENCOUNTER — Encounter (HOSPITAL_BASED_OUTPATIENT_CLINIC_OR_DEPARTMENT_OTHER): Payer: Self-pay | Admitting: Emergency Medicine

## 2022-09-16 ENCOUNTER — Emergency Department (HOSPITAL_BASED_OUTPATIENT_CLINIC_OR_DEPARTMENT_OTHER)
Admission: EM | Admit: 2022-09-16 | Discharge: 2022-09-16 | Disposition: A | Discharge status: Home / Self Care | Payer: Medicare HMO | Attending: Emergency Medicine | Admitting: Emergency Medicine

## 2022-09-16 ENCOUNTER — Other Ambulatory Visit: Payer: Self-pay

## 2022-09-16 DIAGNOSIS — Z7982 Long term (current) use of aspirin: Secondary | ICD-10-CM | POA: Diagnosis not present

## 2022-09-16 DIAGNOSIS — M5442 Lumbago with sciatica, left side: Secondary | ICD-10-CM | POA: Insufficient documentation

## 2022-09-16 DIAGNOSIS — M545 Low back pain, unspecified: Secondary | ICD-10-CM | POA: Diagnosis present

## 2022-09-16 DIAGNOSIS — G8929 Other chronic pain: Secondary | ICD-10-CM | POA: Insufficient documentation

## 2022-09-16 DIAGNOSIS — M5441 Lumbago with sciatica, right side: Secondary | ICD-10-CM | POA: Insufficient documentation

## 2022-09-16 LAB — URINALYSIS, ROUTINE W REFLEX MICROSCOPIC
Bilirubin Urine: NEGATIVE
Glucose, UA: NEGATIVE mg/dL
Ketones, ur: NEGATIVE mg/dL
Nitrite: NEGATIVE
Protein, ur: NEGATIVE mg/dL
Specific Gravity, Urine: 1.01 (ref 1.005–1.030)
pH: 5.5 (ref 5.0–8.0)

## 2022-09-16 LAB — URINALYSIS, MICROSCOPIC (REFLEX): Bacteria, UA: NONE SEEN

## 2022-09-16 MED ORDER — ACETAMINOPHEN 500 MG PO TABS
1000.0000 mg | ORAL_TABLET | Freq: Once | ORAL | Status: AC
  Administered 2022-09-16: 1000 mg via ORAL
  Filled 2022-09-16: qty 2

## 2022-09-16 MED ORDER — OXYCODONE HCL 5 MG PO TABS
10.0000 mg | ORAL_TABLET | Freq: Once | ORAL | Status: AC
  Administered 2022-09-16: 10 mg via ORAL
  Filled 2022-09-16: qty 2

## 2022-09-16 MED ORDER — KETOROLAC TROMETHAMINE 15 MG/ML IJ SOLN
15.0000 mg | Freq: Once | INTRAMUSCULAR | Status: AC
  Administered 2022-09-16: 15 mg via INTRAMUSCULAR
  Filled 2022-09-16: qty 1

## 2022-09-16 MED ORDER — ONDANSETRON 4 MG PO TBDP
4.0000 mg | ORAL_TABLET | Freq: Once | ORAL | Status: AC
  Administered 2022-09-16: 4 mg via ORAL
  Filled 2022-09-16: qty 1

## 2022-09-16 NOTE — ED Provider Notes (Signed)
[facility] [place] Provider Note   CSN: [id number] Arrival date & time: 09/16/22  1847     History  Chief Complaint  Patient presents with   Back Pain    Patricia Sutton is a 77 y.o. female.  77 yo F with a  cc of low back pain.  This been a chronic problem for her.  She says sometimes she has worse times than others.  She denies any injury.  She sees pain management for her back.  Radiates down both legs.  No fevers.  No abdominal pain.   Back Pain      Home Medications Prior to Admission medications   Medication Sig Start Date End Date Taking? Authorizing Provider  albuterol (PROVENTIL) (2.5 MG/3ML) 0.083% nebulizer solution Take 2.5 mg by nebulization every 4 (four) hours as needed for wheezing or shortness of breath.    [provider]  amoxicillin-clavulanate (AUGMENTIN) 875-125 MG tablet Take 1 tablet by mouth 2 (two) times daily. 04/11/20   [provider]  aspirin EC 81 MG tablet Take 1 tablet (81 mg total) by mouth daily. 03/16/15   Catarina Hartshorn, MD  atorvastatin (LIPITOR) 40 MG tablet Take by mouth. 10/18/19   [provider]  atorvastatin (LIPITOR) 40 MG tablet Take 40 mg by mouth at bedtime. 01/13/20   [provider]  azithromycin (ZITHROMAX) 250 MG tablet Take 1 tablet (250 mg total) by mouth daily. Take first 2 tablets together, then 1 every day until finished. 11/27/21   Alvira Monday, MD  celecoxib (CELEBREX) 100 MG capsule Take 1 capsule (100 mg total) by mouth 2 (two) times daily. 06/19/19   Kerrin Champagne, MD  cephALEXin (KEFLEX) 500 MG capsule Take 1 capsule (500 mg total) by mouth 4 (four) times daily. 12/21/21   Terrilee Files, MD  cetirizine (ZYRTEC) 10 MG tablet Take by mouth. 09/08/19 09/07/20  [provider]  clonazePAM (KLONOPIN) 0.5 MG tablet Take 0.5 mg by mouth at bedtime as needed. 04/28/20   [provider]  cyclobenzaprine (FLEXERIL) 5 MG tablet Take 1 tablet  (5 mg total) by mouth 3 (three) times daily as needed. 08/20/21   Charlynne Pander, MD  diclofenac Sodium (VOLTAREN) 1 % GEL APPLY 2 TO 4 GRAMS TOPICALLY TO THE AFFECTED AREA 2 TO 3 TIMES DAILY 10/25/21   Kerrin Champagne, MD  DOK 100 MG capsule Take 100 mg by mouth 2 (two) times daily. 03/27/19   [provider]  doxycycline (VIBRA-TABS) 100 MG tablet Take 100 mg by mouth 2 (two) times daily. 04/30/20   [provider]  ergocalciferol (VITAMIN D2) 1.25 MG (50000 UT) capsule Take by mouth. 03/13/19   [provider]  fluconazole (DIFLUCAN) 150 MG tablet Take by mouth. 04/28/20   [provider]  fluticasone (FLONASE) 50 MCG/ACT nasal spray SHAKE LIQUID AND USE 2 SPRAYS IN EACH NOSTRIL DAILY 09/08/19   [provider]  fluticasone (FLONASE) 50 MCG/ACT nasal spray Place 2 sprays into both nostrils daily. 01/12/20   [provider]  Fluticasone-Umeclidin-Vilant (TRELEGY ELLIPTA) 100-62.5-25 MCG/INH AEPB Inhale into the lungs. 02/17/19   [provider]  Fluticasone-Umeclidin-Vilant (TRELEGY ELLIPTA) 100-62.5-25 MCG/INH AEPB Inhale into the lungs. 02/17/19   [provider]  HYDROcodone-acetaminophen (NORCO) 10-325 MG tablet Take 0.5 tablets by mouth in the morning, at noon, and at bedtime. 02/01/22   Kerrin Champagne, MD  HYDROcodone-acetaminophen (NORCO/VICODIN) 5-325 MG tablet Take 1 tablet by mouth 3 (  three) times daily. 02/15/22   Kerrin Champagne, MD  hydrOXYzine (ATARAX/VISTARIL) 25 MG tablet TAKE 1 TABLET(25 MG) BY MOUTH EVERY 8 HOURS AS NEEDED FOR ANXIETY 03/04/19   [provider]  levofloxacin (LEVAQUIN) 750 MG tablet Take 750 mg by mouth daily. 04/18/20   [provider]  levothyroxine (SYNTHROID, LEVOTHROID) 50 MCG tablet Take 1 tablet by mouth daily. 09/04/17   [provider]  loratadine (CLARITIN) 10 MG tablet Take 10 mg by mouth daily. 06/25/19   [provider]  Misc. Devices MISC Hypoxia resolved.    Discontinue oxygen 10/28/19   [provider]  omeprazole (PRILOSEC) 20 MG capsule Take 20 mg by mouth 2 (two) times daily.    [provider]  ondansetron (ZOFRAN-ODT) 4 MG disintegrating tablet Take 1 tablet (4 mg total) by mouth every 8 (eight) hours as needed for nausea or vomiting. 12/21/21   Terrilee Files, MD  pantoprazole (PROTONIX) 40 MG tablet  11/14/19   [provider]  pantoprazole (PROTONIX) 40 MG tablet Take 40 mg by mouth daily. 02/15/20   [provider]  predniSONE (DELTASONE) 20 MG tablet Take by mouth. 10/18/19   [provider]  predniSONE (STERAPRED UNI-PAK 21 TAB) 5 MG (21) TBPK tablet Take by mouth. 04/30/20   [provider]  pregabalin (LYRICA) 100 MG capsule Take 100 mg by mouth 2 (two) times daily. 03/16/20   [provider]  pregabalin (LYRICA) 100 MG capsule Take by mouth. 10/18/19   [provider]  pregabalin (LYRICA) 200 MG capsule  05/21/19   [provider]  promethazine (PHENERGAN) 25 MG tablet Take 25 mg by mouth 3 (three) times daily as needed for nausea or vomiting.    [provider]  promethazine (PHENERGAN) 25 MG tablet Take 1 tablet by mouth every 8 (eight) hours as needed. 12/31/19   [provider]  traMADol (ULTRAM) 50 MG tablet Take 1 tablet (50 mg total) by mouth every 12 (twelve) hours as needed. 12/09/21   Naida Sleight, PA-C  traMADol (ULTRAM) 50 MG tablet Take 1 tablet (50 mg total) by mouth every 12 (twelve) hours as needed. 01/13/22   Naida Sleight, PA-C  traZODone (DESYREL) 100 MG tablet Take 100 mg by mouth at bedtime.     [provider]  traZODone (DESYREL) 100 MG tablet Take by mouth.    [provider]  VIIBRYD 20 MG TABS Take 1 tablet by mouth daily. 09/05/17   [provider]  VIIBRYD 40 MG TABS Take 40 mg by mouth daily. 02/10/20   [provider]  Vilazodone HCl (VIIBRYD) 40 MG TABS Take 1 tablet by mouth  daily. 11/18/19   [provider]      Allergies    Trazodone and Mirtazapine    Review of Systems   Review of Systems  Musculoskeletal:  Positive for back pain.    Physical Exam Updated Vital Signs BP (!) 177/96   Pulse 74   Temp 98 F (36.7 C) (Oral)   Resp (!) 22   Ht 5\' 3"  (1.6 m)   Wt 50.8 kg   SpO2 95%   BMI 19.84 kg/m  Physical Exam Vitals and nursing note reviewed.  Constitutional:      General: She is not in acute distress.    Appearance: She is well-developed. She is not diaphoretic.  HENT:     Head: Normocephalic and atraumatic.  Eyes:     Pupils: Pupils are equal,  round, and reactive to light.  Cardiovascular:     Rate and Rhythm: Normal rate and regular rhythm.     Heart sounds: No murmur heard.    No friction rub. No gallop.  Pulmonary:     Effort: Pulmonary effort is normal.     Breath sounds: No wheezing or rales.  Abdominal:     General: There is no distension.     Palpations: Abdomen is soft.     Tenderness: There is no abdominal tenderness.  Musculoskeletal:        General: No tenderness.     Cervical back: Normal range of motion and neck supple.     Comments: Pulse motor and sensation intact bilateral lower extremities.  Reflexes 2+ and equal.  No clonus.  Skin:    General: Skin is warm and dry.  Neurological:     Mental Status: She is alert and oriented to person, place, and time.  Psychiatric:        Behavior: Behavior normal.     ED Results / Procedures / Treatments   Labs (all labs ordered are listed, but only abnormal results are displayed) Labs Reviewed  URINALYSIS, ROUTINE W REFLEX MICROSCOPIC - Abnormal; Notable for the following components:      Result Value   Color, Urine STRAW (*)    Hgb urine dipstick TRACE (*)    Leukocytes,Ua TRACE (*)    All other components within normal limits  URINALYSIS, MICROSCOPIC (REFLEX)    EKG None  Radiology No results found.  Procedures Procedures    Medications  Ordered in ED Medications  acetaminophen (TYLENOL) tablet 1,000 mg (1,000 mg Oral Given 09/16/22 2141)  ketorolac (TORADOL) 15 MG/ML injection 15 mg (15 mg Intramuscular Given 09/16/22 2143)  oxyCODONE (Oxy IR/ROXICODONE) immediate release tablet 10 mg (10 mg Oral Given 09/16/22 2142)  ondansetron (ZOFRAN-ODT) disintegrating tablet 4 mg (4 mg Oral Given 09/16/22 2141)    ED Course/ Medical Decision Making/ A&P                             Medical Decision Making Amount and/or Complexity of Data Reviewed Labs: ordered.  Risk OTC drugs. Prescription drug management.   77 yo F with a chief complaints of low back pain.  Is a chronic problem for her.  Going on for years.  Sees pain management for this.  She tells me that every once a while it will flareup like this.  Usually she comes into the emergency department and gets an injection.  No obvious concerning finding on exam.  She is concerned about edema though I do not appreciate any edema to either lower extremity.  Neurovascularly intact otherwise.  Will treat symptomatically here.  Encouraged her to follow-up with her pain management clinic.  10:39 PM:  I have discussed the diagnosis/risks/treatment options with the patient.  Evaluation and diagnostic testing in the emergency department does not suggest an emergent condition requiring admission or immediate intervention beyond what has been performed at this time.  They will follow up with PCP, pain management. We also discussed returning to the ED immediately if new or worsening sx occur. We discussed the sx which are most concerning (e.g., sudden worsening pain, fever, inability to tolerate by mouth) that necessitate immediate return. Medications administered to the patient during their visit and any new prescriptions provided to the patient are listed below.  Medications given during this visit Medications  acetaminophen (TYLENOL) tablet 1,000  mg (1,000 mg Oral Given 09/16/22 2141)   ketorolac (TORADOL) 15 MG/ML injection 15 mg (15 mg Intramuscular Given 09/16/22 2143)  oxyCODONE (Oxy IR/ROXICODONE) immediate release tablet 10 mg (10 mg Oral Given 09/16/22 2142)  ondansetron (ZOFRAN-ODT) disintegrating tablet 4 mg (4 mg Oral Given 09/16/22 2141)     The patient appears reasonably screen and/or stabilized for discharge and I doubt any other medical condition or other [facility] requiring further screening, evaluation, or treatment in the ED at this time prior to discharge.          Final Clinical Impression(s) / ED Diagnoses Final diagnoses:  Chronic bilateral low back pain with bilateral sciatica    Rx / DC Orders ED Discharge Orders     None         Melene Plan, DO 09/16/22 2239

## 2022-09-16 NOTE — ED Triage Notes (Signed)
Pt c/o lower back pain that is worse than usual; reports swelling to legs; no swelling noted at this time

## 2022-09-16 NOTE — Discharge Instructions (Signed)
Your back pain is most likely due to a muscular strain.  There is been a lot of research on back pain, unfortunately the only thing that seems to really help is Tylenol and ibuprofen.  Relative rest is also important to not lift greater than 10 pounds bending or twisting at the waist.  Please follow-up with your family physician.  The other thing that really seems to benefit patients is physical therapy which your doctor may send you for.  Please return to the emergency department for new numbness or weakness to your arms or legs. Difficulty with urinating or urinating or pooping on yourself.  Also if you cannot feel toilet paper when you wipe or get a fever.   Take 4 over the counter ibuprofen tablets 3 times a day or 2 over-the-counter naproxen tablets twice a day for pain. Also take tylenol (2 extra strength) four times a day.   Please call your pain management docs on Monday and let them know that your pain is worse

## 2022-09-21 DIAGNOSIS — Z79899 Other long term (current) drug therapy: Secondary | ICD-10-CM | POA: Insufficient documentation

## 2022-10-06 ENCOUNTER — Encounter (HOSPITAL_BASED_OUTPATIENT_CLINIC_OR_DEPARTMENT_OTHER): Payer: Self-pay

## 2022-10-06 ENCOUNTER — Emergency Department (HOSPITAL_BASED_OUTPATIENT_CLINIC_OR_DEPARTMENT_OTHER)
Admission: EM | Admit: 2022-10-06 | Discharge: 2022-10-06 | Disposition: A | Discharge status: Home / Self Care | Payer: Medicare HMO | Attending: Emergency Medicine | Admitting: Emergency Medicine

## 2022-10-06 ENCOUNTER — Other Ambulatory Visit: Payer: Self-pay

## 2022-10-06 DIAGNOSIS — Z79899 Other long term (current) drug therapy: Secondary | ICD-10-CM | POA: Diagnosis not present

## 2022-10-06 DIAGNOSIS — Z23 Encounter for immunization: Secondary | ICD-10-CM | POA: Diagnosis not present

## 2022-10-06 DIAGNOSIS — E039 Hypothyroidism, unspecified: Secondary | ICD-10-CM | POA: Diagnosis not present

## 2022-10-06 DIAGNOSIS — J449 Chronic obstructive pulmonary disease, unspecified: Secondary | ICD-10-CM | POA: Insufficient documentation

## 2022-10-06 DIAGNOSIS — W1839XA Other fall on same level, initial encounter: Secondary | ICD-10-CM | POA: Diagnosis not present

## 2022-10-06 DIAGNOSIS — S61412A Laceration without foreign body of left hand, initial encounter: Secondary | ICD-10-CM | POA: Diagnosis not present

## 2022-10-06 DIAGNOSIS — S61419A Laceration without foreign body of unspecified hand, initial encounter: Secondary | ICD-10-CM

## 2022-10-06 DIAGNOSIS — W19XXXA Unspecified fall, initial encounter: Secondary | ICD-10-CM

## 2022-10-06 DIAGNOSIS — S51012A Laceration without foreign body of left elbow, initial encounter: Secondary | ICD-10-CM

## 2022-10-06 DIAGNOSIS — J45909 Unspecified asthma, uncomplicated: Secondary | ICD-10-CM | POA: Diagnosis not present

## 2022-10-06 MED ORDER — HYDROCODONE-ACETAMINOPHEN 5-325 MG PO TABS
1.0000 | ORAL_TABLET | Freq: Once | ORAL | Status: AC
  Administered 2022-10-06: 1 via ORAL
  Filled 2022-10-06: qty 1

## 2022-10-06 MED ORDER — ONDANSETRON 4 MG PO TBDP
4.0000 mg | ORAL_TABLET | Freq: Once | ORAL | Status: AC
  Administered 2022-10-06: 4 mg via ORAL
  Filled 2022-10-06: qty 1

## 2022-10-06 MED ORDER — TETANUS-DIPHTH-ACELL PERTUSSIS 5-2.5-18.5 LF-MCG/0.5 IM SUSY
0.5000 mL | PREFILLED_SYRINGE | Freq: Once | INTRAMUSCULAR | Status: AC
  Administered 2022-10-06: 0.5 mL via INTRAMUSCULAR
  Filled 2022-10-06: qty 0.5

## 2022-10-06 NOTE — ED Notes (Signed)
Wound irrigated at this time with NaCl. Patient tolerated well. MD made aware.

## 2022-10-06 NOTE — ED Provider Notes (Signed)
[place] [facility] Provider Note   CSN: [id number] Arrival date & time: 10/06/22  1526     History  Chief Complaint  Patient presents with   Patricia Sutton is a 77 y.o. female.   Fall  Patient presents after fall he reported loss of balance and fell on her left side.  Happened around 9 this morning.  Did not hit head.  Is on blood thinners however.  States she has not tried to keep the wounds closed but they keep opening back up.  Does have 1 laceration on the left distal upper arm 1 on left hand.    Past Medical History:  Diagnosis Date   Anxiety    Arthritis    Asthma    Back pain, chronic    Bone spur of other site    spine   Chronic back pain    spurs on spine   COPD (chronic obstructive pulmonary disease) (HCC)    denies SOB   Depression    takes Effexor daily   GERD (gastroesophageal reflux disease)    takes Omeprazole daily   History of bronchitis    last time about 6+yrs ago   History of colon polyps    History of kidney stones    Hypothyroidism    takes Synthroid daily   Insomnia    takes Trazodone nightly and Ambien   Joint pain    Joint swelling    Nocturia    Pneumonia    hx of;last time about 6+yrs ago   PONV (postoperative nausea and vomiting)    Renal insufficiency    Sleep apnea    no CPAP; uses O2 at night 1l/min.   Urinary urgency     Home Medications Prior to Admission medications   Medication Sig Start Date End Date Taking? Authorizing Provider  albuterol (PROVENTIL) (2.5 MG/3ML) 0.083% nebulizer solution Take 2.5 mg by nebulization every 4 (four) hours as needed for wheezing or shortness of breath.    [provider]  amoxicillin-clavulanate (AUGMENTIN) 875-125 MG tablet Take 1 tablet by mouth 2 (two) times daily. 04/11/20   [provider]  aspirin EC 81 MG tablet Take 1 tablet (81 mg total) by mouth daily. 03/16/15   Catarina Hartshorn, MD  atorvastatin (LIPITOR) 40 MG  tablet Take by mouth. 10/18/19   [provider]  atorvastatin (LIPITOR) 40 MG tablet Take 40 mg by mouth at bedtime. 01/13/20   [provider]  azithromycin (ZITHROMAX) 250 MG tablet Take 1 tablet (250 mg total) by mouth daily. Take first 2 tablets together, then 1 every day until finished. 11/27/21   Alvira Monday, MD  celecoxib (CELEBREX) 100 MG capsule Take 1 capsule (100 mg total) by mouth 2 (two) times daily. 06/19/19   Kerrin Champagne, MD  cephALEXin (KEFLEX) 500 MG capsule Take 1 capsule (500 mg total) by mouth 4 (four) times daily. 12/21/21   Terrilee Files, MD  cetirizine (ZYRTEC) 10 MG tablet Take by mouth. 09/08/19 09/07/20  [provider]  clonazePAM (KLONOPIN) 0.5 MG tablet Take 0.5 mg by mouth at bedtime as needed. 04/28/20   [provider]  cyclobenzaprine (FLEXERIL) 5 MG tablet Take 1 tablet (5 mg total) by mouth 3 (three) times daily as needed. 08/20/21   Charlynne Pander, MD  diclofenac Sodium (VOLTAREN) 1 % GEL APPLY 2 TO 4 GRAMS TOPICALLY TO THE AFFECTED AREA 2 TO 3 TIMES DAILY 10/25/21  Kerrin Champagne, MD  DOK 100 MG capsule Take 100 mg by mouth 2 (two) times daily. 03/27/19   [provider]  doxycycline (VIBRA-TABS) 100 MG tablet Take 100 mg by mouth 2 (two) times daily. 04/30/20   [provider]  ergocalciferol (VITAMIN D2) 1.25 MG (50000 UT) capsule Take by mouth. 03/13/19   [provider]  fluconazole (DIFLUCAN) 150 MG tablet Take by mouth. 04/28/20   [provider]  fluticasone (FLONASE) 50 MCG/ACT nasal spray SHAKE LIQUID AND USE 2 SPRAYS IN EACH NOSTRIL DAILY 09/08/19   [provider]  fluticasone (FLONASE) 50 MCG/ACT nasal spray Place 2 sprays into both nostrils daily. 01/12/20   [provider]  Fluticasone-Umeclidin-Vilant (TRELEGY ELLIPTA) 100-62.5-25 MCG/INH AEPB Inhale into the lungs. 02/17/19   [provider]  Fluticasone-Umeclidin-Vilant (TRELEGY ELLIPTA)  100-62.5-25 MCG/INH AEPB Inhale into the lungs. 02/17/19   [provider]  HYDROcodone-acetaminophen (NORCO) 10-325 MG tablet Take 0.5 tablets by mouth in the morning, at noon, and at bedtime. 02/01/22   Kerrin Champagne, MD  HYDROcodone-acetaminophen (NORCO/VICODIN) 5-325 MG tablet Take 1 tablet by mouth 3 (three) times daily. 02/15/22   Kerrin Champagne, MD  hydrOXYzine (ATARAX/VISTARIL) 25 MG tablet TAKE 1 TABLET(25 MG) BY MOUTH EVERY 8 HOURS AS NEEDED FOR ANXIETY 03/04/19   [provider]  levofloxacin (LEVAQUIN) 750 MG tablet Take 750 mg by mouth daily. 04/18/20   [provider]  levothyroxine (SYNTHROID, LEVOTHROID) 50 MCG tablet Take 1 tablet by mouth daily. 09/04/17   [provider]  loratadine (CLARITIN) 10 MG tablet Take 10 mg by mouth daily. 06/25/19   [provider]  Misc. Devices MISC Hypoxia resolved.   Discontinue oxygen 10/28/19   [provider]  omeprazole (PRILOSEC) 20 MG capsule Take 20 mg by mouth 2 (two) times daily.    [provider]  ondansetron (ZOFRAN-ODT) 4 MG disintegrating tablet Take 1 tablet (4 mg total) by mouth every 8 (eight) hours as needed for nausea or vomiting. 12/21/21   Terrilee Files, MD  pantoprazole (PROTONIX) 40 MG tablet  11/14/19   [provider]  pantoprazole (PROTONIX) 40 MG tablet Take 40 mg by mouth daily. 02/15/20   [provider]  predniSONE (DELTASONE) 20 MG tablet Take by mouth. 10/18/19   [provider]  predniSONE (STERAPRED UNI-PAK 21 TAB) 5 MG (21) TBPK tablet Take by mouth. 04/30/20   [provider]  pregabalin (LYRICA) 100 MG capsule Take 100 mg by mouth 2 (two) times daily. 03/16/20   [provider]  pregabalin (LYRICA) 100 MG capsule Take by mouth. 10/18/19   [provider]  pregabalin (LYRICA) 200 MG capsule  05/21/19   [provider]  promethazine (PHENERGAN) 25 MG tablet Take 25 mg by mouth 3 (three) times daily  as needed for nausea or vomiting.    [provider]  promethazine (PHENERGAN) 25 MG tablet Take 1 tablet by mouth every 8 (eight) hours as needed. 12/31/19   [provider]  traMADol (ULTRAM) 50 MG tablet Take 1 tablet (50 mg total) by mouth every 12 (twelve) hours as needed. 12/09/21   Naida Sleight, PA-C  traMADol (ULTRAM) 50 MG tablet Take 1 tablet (50 mg total) by mouth every 12 (twelve) hours as needed. 01/13/22   Naida Sleight, PA-C  traZODone (DESYREL) 100 MG tablet Take 100 mg by mouth at bedtime.     [provider]  traZODone (DESYREL) 100 MG tablet Take  by mouth.    [provider]  VIIBRYD 20 MG TABS Take 1 tablet by mouth daily. 09/05/17   [provider]  VIIBRYD 40 MG TABS Take 40 mg by mouth daily. 02/10/20   [provider]  Vilazodone HCl (VIIBRYD) 40 MG TABS Take 1 tablet by mouth daily. 11/18/19   [provider]      Allergies    Trazodone and Mirtazapine    Review of Systems   Review of Systems  Physical Exam Updated Vital Signs BP (!) 191/71   Pulse (!) 54   Temp 99.2 F (37.3 C) (Oral)   Resp 17   Ht 5\' 3"  (1.6 m)   Wt 47.6 kg   SpO2 93%   BMI 18.60 kg/m  Physical Exam Vitals and nursing note reviewed.  Cardiovascular:     Rate and Rhythm: Normal rate.  Musculoskeletal:     Comments: Approximate 3 cm skin tear to left distal upper arm.  No underlying bony tenderness good range of motion.  On dorsum of left hand near fourth and fifth fingers has approximately 2 and half centimeter skin tear and does have some missing tissue.  Also smaller under 1 cm skin tear more on the lateral aspect of the dorsum of the hand.  All of these do not have underlying bony tenderness and good range of motion.  Neurological:     Mental Status: She is alert.     ED Results / Procedures / Treatments   Labs (all labs ordered are listed, but only abnormal results are displayed) Labs Reviewed - No data to  display  EKG None  Radiology No results found.  Procedures Procedures    Medications Ordered in ED Medications  Tdap (BOOSTRIX) injection 0.5 mL (0.5 mLs Intramuscular Given 10/06/22 1734)  HYDROcodone-acetaminophen (NORCO/VICODIN) 5-325 MG per tablet 1 tablet (1 tablet Oral Given 10/06/22 1750)  ondansetron (ZOFRAN-ODT) disintegrating tablet 4 mg (4 mg Oral Given 10/06/22 1750)    ED Course/ Medical Decision Making/ A&P                             Medical Decision Making Risk Prescription drug management.   Patient with skin tears.  No underlying bony tenderness.  Doubt fracture.  Wounds irrigated by nurse and Steri-Strips placed by me.  Wrap given for the hand since moving the fourth and fifth fingers opens wound back up.  Tetanus updated since it has been more than 10 years.  Discharge home with outpatient follow-up.  Is on blood thinners but did not hit his head.        Final Clinical Impression(s) / ED Diagnoses Final diagnoses:  Fall, initial encounter  Skin tear of hand without complication, initial encounter  Skin tear of elbow without complication, left, initial encounter    Rx / DC Orders ED Discharge Orders     None         Benjiman Core, MD 10/06/22 1839
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
## 2022-11-02 DIAGNOSIS — M7051 Other bursitis of knee, right knee: Secondary | ICD-10-CM | POA: Insufficient documentation

## 2022-11-20 DIAGNOSIS — E1140 Type 2 diabetes mellitus with diabetic neuropathy, unspecified: Secondary | ICD-10-CM | POA: Insufficient documentation

## 2023-01-10 ENCOUNTER — Other Ambulatory Visit: Payer: Self-pay

## 2023-01-10 ENCOUNTER — Emergency Department (HOSPITAL_BASED_OUTPATIENT_CLINIC_OR_DEPARTMENT_OTHER)
Admission: EM | Admit: 2023-01-10 | Discharge: 2023-01-10 | Disposition: A | Discharge status: Home / Self Care | Payer: Medicare HMO | Attending: Emergency Medicine | Admitting: Emergency Medicine

## 2023-01-10 ENCOUNTER — Encounter (HOSPITAL_BASED_OUTPATIENT_CLINIC_OR_DEPARTMENT_OTHER): Payer: Self-pay | Admitting: Pediatrics

## 2023-01-10 DIAGNOSIS — T24202A Burn of second degree of unspecified site of left lower limb, except ankle and foot, initial encounter: Secondary | ICD-10-CM | POA: Diagnosis present

## 2023-01-10 DIAGNOSIS — T310 Burns involving less than 10% of body surface: Secondary | ICD-10-CM | POA: Diagnosis not present

## 2023-01-10 DIAGNOSIS — X17XXXA Contact with hot engines, machinery and tools, initial encounter: Secondary | ICD-10-CM | POA: Diagnosis not present

## 2023-01-10 DIAGNOSIS — L03116 Cellulitis of left lower limb: Secondary | ICD-10-CM | POA: Insufficient documentation

## 2023-01-10 DIAGNOSIS — I4891 Unspecified atrial fibrillation: Secondary | ICD-10-CM | POA: Insufficient documentation

## 2023-01-10 DIAGNOSIS — Z7982 Long term (current) use of aspirin: Secondary | ICD-10-CM | POA: Diagnosis not present

## 2023-01-10 DIAGNOSIS — E039 Hypothyroidism, unspecified: Secondary | ICD-10-CM | POA: Diagnosis not present

## 2023-01-10 DIAGNOSIS — J449 Chronic obstructive pulmonary disease, unspecified: Secondary | ICD-10-CM | POA: Diagnosis not present

## 2023-01-10 MED ORDER — CEPHALEXIN 250 MG PO CAPS
500.0000 mg | ORAL_CAPSULE | Freq: Once | ORAL | Status: AC
  Administered 2023-01-10: 500 mg via ORAL
  Filled 2023-01-10: qty 2

## 2023-01-10 MED ORDER — MUPIROCIN CALCIUM 2 % EX CREA: 1.0000 | TOPICAL_CREAM | Freq: Two times a day (BID) | CUTANEOUS | 0 refills | Status: AC

## 2023-01-10 MED ORDER — CEPHALEXIN 500 MG PO CAPS: 500.0000 mg | ORAL_CAPSULE | Freq: Three times a day (TID) | ORAL | 0 refills | Status: DC
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
## 2023-01-17 DIAGNOSIS — R233 Spontaneous ecchymoses: Secondary | ICD-10-CM | POA: Insufficient documentation

## 2023-01-17 DIAGNOSIS — T148XXA Other injury of unspecified body region, initial encounter: Secondary | ICD-10-CM | POA: Insufficient documentation

## 2023-03-05 DIAGNOSIS — K668 Other specified disorders of peritoneum: Secondary | ICD-10-CM | POA: Insufficient documentation

## 2023-03-20 DIAGNOSIS — R627 Adult failure to thrive: Secondary | ICD-10-CM | POA: Insufficient documentation

## 2023-09-19 ENCOUNTER — Ambulatory Visit: Payer: Medicare HMO | Admitting: Cardiology

## 2023-10-09 DIAGNOSIS — F4323 Adjustment disorder with mixed anxiety and depressed mood: Secondary | ICD-10-CM | POA: Insufficient documentation

## 2023-10-09 DIAGNOSIS — F332 Major depressive disorder, recurrent severe without psychotic features: Secondary | ICD-10-CM | POA: Insufficient documentation

## 2023-11-05 ENCOUNTER — Encounter: Payer: Self-pay | Admitting: Cardiology

## 2023-11-06 ENCOUNTER — Ambulatory Visit: Attending: Cardiology

## 2023-11-06 ENCOUNTER — Encounter: Payer: Self-pay | Admitting: Cardiology

## 2023-11-06 ENCOUNTER — Ambulatory Visit: Attending: Cardiology | Admitting: Cardiology

## 2023-11-06 VITALS — BP 138/76 | HR 77 | Ht 63.0 in | Wt 114.0 lb

## 2023-11-06 DIAGNOSIS — E038 Other specified hypothyroidism: Secondary | ICD-10-CM

## 2023-11-06 DIAGNOSIS — I480 Paroxysmal atrial fibrillation: Secondary | ICD-10-CM | POA: Diagnosis not present

## 2023-11-06 DIAGNOSIS — J438 Other emphysema: Secondary | ICD-10-CM

## 2023-11-06 DIAGNOSIS — R0602 Shortness of breath: Secondary | ICD-10-CM

## 2023-11-06 NOTE — Addendum Note (Signed)
 Addended by: Shawnee Dellen D on: 11/06/2023 04:32 PM   Modules accepted: Orders

## 2023-11-06 NOTE — Patient Instructions (Signed)
 Medication Instructions:  Your physician recommends that you continue on your current medications as directed. Please refer to the Current Medication list given to you today.  *If you need a refill on your cardiac medications before your next appointment, please call your pharmacy*   Lab Work: None Ordered If you have labs (blood work) drawn today and your tests are completely normal, you will receive your results only by: MyChart Message (if you have MyChart) OR A paper copy in the mail If you have any lab test that is abnormal or we need to change your treatment, we will call you to review the results.   Testing/Procedures: Your physician has requested that you have an echocardiogram. Echocardiography is a painless test that uses sound waves to create images of your heart. It provides your doctor with information about the size and shape of your heart and how well your heart's chambers and valves are working. This procedure takes approximately one hour. There are no restrictions for this procedure. Please do NOT wear cologne, perfume, aftershave, or lotions (deodorant is allowed). Please arrive 15 minutes prior to your appointment time.  Please note: We ask at that you not bring children with you during ultrasound (echo/ vascular) testing. Due to room size and safety concerns, children are not allowed in the ultrasound rooms during exams. Our front office staff cannot provide observation of children in our lobby area while testing is being conducted. An adult accompanying a patient to their appointment will only be allowed in the ultrasound room at the discretion of the ultrasound technician under special circumstances. We apologize for any inconvenience.    WHY IS MY DOCTOR PRESCRIBING ZIO? The Zio system is proven and trusted by physicians to detect and diagnose irregular heart rhythms -- and has been prescribed to hundreds of thousands of patients.  The FDA has cleared the Zio system to  monitor for many different kinds of irregular heart rhythms. In a study, physicians were able to reach a diagnosis 90% of the time with the Zio system1.  You can wear the Zio monitor -- a small, discreet, comfortable patch -- during your normal day-to-day activity, including while you sleep, shower, and exercise, while it records every single heartbeat for analysis.  1Barrett, P., et al. Comparison of 24 Hour Holter Monitoring Versus 14 Day Novel Adhesive Patch Electrocardiographic Monitoring. American Journal of Medicine, 2014.  ZIO VS. HOLTER MONITORING The Zio monitor can be comfortably worn for up to 14 days. Holter monitors can be worn for 24 to 48 hours, limiting the time to record any irregular heart rhythms you may have. Zio is able to capture data for the 51% of patients who have their first symptom-triggered arrhythmia after 48 hours.1  LIVE WITHOUT RESTRICTIONS The Zio ambulatory cardiac monitor is a small, unobtrusive, and water-resistant patch--you might even forget you're wearing it. The Zio monitor records and stores every beat of your heart, whether you're sleeping, working out, or showering.     Follow-Up: At [facility], you and your health needs are our priority.  As part of our continuing mission to provide you with exceptional heart care, we have created designated Provider Care Teams.  These Care Teams include your primary Cardiologist (physician) and Advanced Practice Providers (APPs -  Physician Assistants and Nurse Practitioners) who all work together to provide you with the care you need, when you need it.  We recommend signing up for the patient portal called "MyChart".  Sign up information is provided on this  After Visit Summary.  MyChart is used to connect with patients for Virtual Visits (Telemedicine).  Patients are able to view lab/test results, encounter notes, upcoming appointments, etc.  Non-urgent messages can be sent to your provider as well.   To learn more  about what you can do with MyChart, go to [email or web address].    Your next appointment:   2 month(s)  The format for your next appointment:   In Person  Provider:   Gypsy Balsam, MD    Other Instructions NA

## 2023-11-06 NOTE — Progress Notes (Signed)
 Cardiology Consultation:    Date:  11/06/2023   ID:  Patricia Sutton, DOB [birth year], MRN [id number]  PCP:  Mariella Shore, MD  Cardiologist:  Ralene Burger, MD   Referring MD: Mariella Shore, MD   Chief Complaint  Patient presents with   Follow-up   Establish Care    History of Present Illness:    Patricia Sutton is a 78 y.o. female who is being seen today for the evaluation of paroxysmal atrial fibrillation at the request of Mariella Shore, MD. past medical history significant for advanced COPD, she is a chronic smoker she quit in May 22, 2022 but then recently relapsed, hypothyroidism, prediabetes, anxiety, depression she was referred to us  for management of atrial fibrillation apparently in June 02, 2022 she was admitted to the hospital because of exacerbation of COPD pneumonia she had episode of atrial fibrillation since that time she was put on anticoagulation form of Eliquis and doing well she described however some episode that she wakes up in the middle of the night with shortness of breath and heart pounding.  It happens rarely but does occur and it scares her.  Her ability to exercise is limited because of advanced COPD.  On top of that she does have chronic back problem which give her a lot of pain so mobility is somewhat limited.  Denies having any typical exertional chest pain tightness squeezing pressure burning chest no swelling of lower extremities.  She did have significant weight loss and she does have PEG tube that she uses to feed herself.  She said she simply does not have much appetite.  Past Medical History:  Diagnosis Date   Anxiety    Arthritis    Asthma    Back pain, chronic    Bone spur of other site    spine   Chronic back pain    spurs on spine   COPD (chronic obstructive pulmonary disease) (HCC)    denies SOB   Depression    takes Effexor  daily   GERD (gastroesophageal reflux disease)    takes Omeprazole daily   History of bronchitis     last time about 6+yrs ago   History of colon polyps    History of kidney stones    Hypothyroidism    takes Synthroid  daily   Insomnia    takes Trazodone  nightly and Ambien    Joint pain    Joint swelling    Nocturia    Pneumonia    hx of;last time about 6+yrs ago   PONV (postoperative nausea and vomiting)    Renal insufficiency    Sleep apnea    no CPAP; uses O2 at night 1l/min.   Urinary urgency     Past Surgical History:  Procedure Laterality Date   ABDOMINAL HYSTERECTOMY     complete   bladder tacked     COLONOSCOPY     ESOPHAGOGASTRODUODENOSCOPY     KNEE ARTHROPLASTY Right 07/18/2013   Procedure: COMPUTER ASSISTED RIGHT TOTAL KNEE ARTHROPLASTY;  Surgeon: Alphonso Jean, MD;  Location: [facility] OR;  Service: Orthopedics;  Laterality: Right;   KNEE ARTHROSCOPY Right 07/16/2012   Procedure: ARTHROSCOPY KNEE;  Surgeon: Alphonso Jean, MD;  Location: [facility];  Service: Orthopedics;  Laterality: Right;  Right knee arthroscopy with chondroplastic shaving of retropatella surface    Current Medications: Current Meds  Medication Sig   albuterol  (PROVENTIL ) (2.5 MG/3ML) 0.083% nebulizer solution Take 2.5 mg by nebulization every 4 (four) hours as needed  for wheezing or shortness of breath.   apixaban (ELIQUIS) 5 MG TABS tablet Take 5 mg by mouth 2 (two) times daily.   aspirin  EC 81 MG tablet Take 1 tablet (81 mg total) by mouth daily.   atorvastatin (LIPITOR) 40 MG tablet Take 40 mg by mouth daily.   atorvastatin (LIPITOR) 40 MG tablet Take 40 mg by mouth at bedtime.   celecoxib  (CELEBREX ) 100 MG capsule Take 1 capsule (100 mg total) by mouth 2 (two) times daily.   cephALEXin  (KEFLEX ) 500 MG capsule Take 1 capsule (500 mg total) by mouth 3 (three) times daily.   cetirizine (ZYRTEC) 10 MG tablet Take 10 mg by mouth daily.   clonazePAM  (KLONOPIN ) 0.5 MG tablet Take 0.5 mg by mouth at bedtime as needed for anxiety.   cyclobenzaprine  (FLEXERIL ) 5 MG tablet Take 1 tablet (5 mg  total) by mouth 3 (three) times daily as needed. (Patient taking differently: Take 5 mg by mouth 3 (three) times daily as needed for muscle spasms.)   diclofenac  Sodium (VOLTAREN ) 1 % GEL APPLY 2 TO 4 GRAMS TOPICALLY TO THE AFFECTED AREA 2 TO 3 TIMES DAILY (Patient taking differently: Apply 2-4 g topically See admin instructions. APPLY 2 TO 4 GRAMS TOPICALLY TO THE AFFECTED AREA 2 TO 3 TIMES DAILY)   DOK 100 MG capsule Take 100 mg by mouth 2 (two) times daily.   ergocalciferol (VITAMIN D2) 1.25 MG (50000 UT) capsule Take 50,000 Units by mouth once a week.   escitalopram (LEXAPRO) 10 MG tablet Take 10 mg by mouth daily.   fluconazole  (DIFLUCAN ) 150 MG tablet Take 150 mg by mouth once.   fluticasone (FLONASE) 50 MCG/ACT nasal spray Place 2 sprays into both nostrils daily.   Fluticasone-Umeclidin-Vilant (TRELEGY ELLIPTA) 100-62.5-25 MCG/INH AEPB Inhale 1 puff into the lungs daily.   HYDROcodone -acetaminophen  (NORCO) 10-325 MG tablet Take 0.5 tablets by mouth in the morning, at noon, and at bedtime.   HYDROcodone -acetaminophen  (NORCO/VICODIN) 5-325 MG tablet Take 1 tablet by mouth 3 (three) times daily.   hydrOXYzine (ATARAX/VISTARIL) 25 MG tablet Take 25 mg by mouth every 8 (eight) hours as needed for anxiety.   levofloxacin (LEVAQUIN) 750 MG tablet Take 750 mg by mouth daily.   levothyroxine  (SYNTHROID , LEVOTHROID) 50 MCG tablet Take 1 tablet by mouth daily.   loratadine (CLARITIN) 10 MG tablet Take 10 mg by mouth daily.   Misc. Devices MISC 1 each by Other route daily.   mupirocin  cream (BACTROBAN ) 2 % Apply 1 Application topically 2 (two) times daily.   omeprazole (PRILOSEC) 20 MG capsule Take 20 mg by mouth 2 (two) times daily.   ondansetron  (ZOFRAN -ODT) 4 MG disintegrating tablet Take 1 tablet (4 mg total) by mouth every 8 (eight) hours as needed for nausea or vomiting.   pantoprazole  (PROTONIX ) 40 MG tablet Take 40 mg by mouth daily.   predniSONE  (STERAPRED UNI-PAK 21 TAB) 5 MG (21) TBPK tablet  Take by mouth.   pregabalin (LYRICA) 100 MG capsule Take 100 mg by mouth 2 (two) times daily.   promethazine  (PHENERGAN ) 25 MG tablet Take 25 mg by mouth 3 (three) times daily as needed for nausea or vomiting.   traMADol  (ULTRAM ) 50 MG tablet Take 1 tablet (50 mg total) by mouth every 12 (twelve) hours as needed.   traZODone  (DESYREL ) 100 MG tablet Take 100 mg by mouth at bedtime as needed for sleep.   VIIBRYD 40 MG TABS Take 40 mg by mouth daily.     Allergies:  Trazodone  and Mirtazapine   Social History   Socioeconomic History   Marital status: Legally Separated    Spouse name: Not on file   Number of children: Not on file   Years of education: Not on file   Highest education level: Not on file  Occupational History   Not on file  Tobacco Use   Smoking status: Some Days    Current packs/day: 1.00    Average packs/day: 1 pack/day for 40.0 years (40.0 ttl pk-yrs)    Types: Cigarettes   Smokeless tobacco: Never  Vaping Use   Vaping status: Some Days  Substance and Sexual Activity   Alcohol use: No   Drug use: No   Sexual activity: Not on file  Other Topics Concern   Not on file  Social History Narrative   Not on file   Social Drivers of Health   Financial Resource Strain: Not on file  Food Insecurity: Low Risk  (10/16/2023)   Received from [facility]   Hunger Vital Sign    Worried About Running Out of Food in the Last Year: Never true    Ran Out of Food in the Last Year: Never true  Transportation Needs: No Transportation Needs (10/16/2023)   Received from [facility]    In the past 12 months, has lack of reliable transportation kept you from medical appointments, meetings, work or from getting things needed for daily living? : No  Physical Activity: Not on file  Stress: Not on file  Social Connections: Unknown (10/04/2021)   Received from [facility]   Social Network    Social Network: Not on file     Family History: The  patient's family history includes Diabetes Mellitus II in her mother; Heart attack in her mother; Lymphoma in her father; Stroke in her mother. ROS:   Please see the history of present illness.    All 14 point review of systems negative except as described per history of present illness.  EKGs/Labs/Other Studies Reviewed:    The following studies were reviewed today:   EKG:       Recent Labs: No results found for requested labs within last 365 days.  Recent Lipid Panel No results found for: "CHOL", "TRIG", "HDL", "CHOLHDL", "VLDL", "LDLCALC", "LDLDIRECT"  Physical Exam:    VS:  BP 138/76 (BP Location: Left Arm, Patient Position: Sitting)   Pulse 77   Ht 5\' 3"  (1.6 m)   Wt 114 lb (51.7 kg)   SpO2 90%   BMI 20.19 kg/m     Wt Readings from Last 3 Encounters:  11/06/23 114 lb (51.7 kg)  01/10/23 90 lb 2.7 oz (40.9 kg)  10/06/22 105 lb (47.6 kg)     GEN: Almost cachectic looking, well developed in no acute distress HEENT: Normal NECK: No JVD; No carotid bruits LYMPHATICS: No lymphadenopathy CARDIAC: RRR, tones of this and RESPIRATORY: Overinflated, poor air entry bilaterally ABDOMEN: Soft, non-tender, non-distended MUSCULOSKELETAL:  No edema; No deformity  SKIN: Warm and dry NEUROLOGIC:  Alert and oriented x 3 PSYCHIATRIC:  Normal affect   ASSESSMENT:    1. SOB (shortness of breath)   2. Paroxysmal atrial fibrillation (HCC)   3. Other emphysema (HCC)   4. Other specified hypothyroidism    PLAN:    In order of problems listed above:  Paroxysmal atrial fibrillation.  Today in sinus rhythm but she described to have some palpitations will put Zio patch on her for 2 weeks to  see if she get any recurrences of atrial fibrillation in the meantime continue anticoagulation. Dyspnea on exertion and dyspnea with laying down flat at night question about potentially paroxysmal nocturnal dyspnea, we will schedule her to have echocardiogram to assess left ventricular systolic as  well as diastolic function. COPD which is advanced noted.  Sadly she is back to smoking but she is determined to quit. Hopefully she will be able to accomplish that goal. Cholesterol status unknown.  Will try to get her fasting lipid profile from primary care physician.   Medication Adjustments/Labs and Tests Ordered: Current medicines are reviewed at length with the patient today.  Concerns regarding medicines are outlined above.  Orders Placed This Encounter  Procedures   EKG 12-Lead   No orders of the defined types were placed in this encounter.   Signed, Manfred Seed, MD, [facility]. 11/06/2023 4:10 PM    [facility]

## 2023-12-03 ENCOUNTER — Other Ambulatory Visit: Payer: Self-pay

## 2023-12-03 ENCOUNTER — Emergency Department (HOSPITAL_BASED_OUTPATIENT_CLINIC_OR_DEPARTMENT_OTHER)

## 2023-12-03 ENCOUNTER — Emergency Department (HOSPITAL_BASED_OUTPATIENT_CLINIC_OR_DEPARTMENT_OTHER)
Admission: EM | Admit: 2023-12-03 | Discharge: 2023-12-03 | Disposition: A | Discharge status: Home / Self Care | Attending: Emergency Medicine | Admitting: Emergency Medicine

## 2023-12-03 ENCOUNTER — Encounter (HOSPITAL_BASED_OUTPATIENT_CLINIC_OR_DEPARTMENT_OTHER): Payer: Self-pay | Admitting: Emergency Medicine

## 2023-12-03 DIAGNOSIS — Z7901 Long term (current) use of anticoagulants: Secondary | ICD-10-CM | POA: Insufficient documentation

## 2023-12-03 DIAGNOSIS — Z7982 Long term (current) use of aspirin: Secondary | ICD-10-CM | POA: Diagnosis not present

## 2023-12-03 DIAGNOSIS — M25561 Pain in right knee: Secondary | ICD-10-CM | POA: Insufficient documentation

## 2023-12-03 MED ORDER — OXYCODONE-ACETAMINOPHEN 5-325 MG PO TABS
1.0000 | ORAL_TABLET | Freq: Once | ORAL | Status: AC
  Administered 2023-12-03: 1 via ORAL
  Filled 2023-12-03: qty 1

## 2023-12-03 NOTE — ED Provider Notes (Signed)
 [facility] Provider Note   CSN: [id number] Arrival date & time: 12/03/23  1525     Patient presents with: Leg Pain   Patricia Sutton is a 78 y.o. female.   This is a 78 year old female history of chronic knee and back pain here today for worsening pain of her right knee.  Patient reports that she had her knee replaced in 2014, recently she has been having increasing pain in the area.  She feels like it is unsteady when she walks on it, has increasing pain.  Yesterday she was able to ambulate for about 3 hours before the pain became worse.  No trauma to the area.   Leg Pain      Prior to Admission medications   Medication Sig Start Date End Date Taking? Authorizing Provider  albuterol  (PROVENTIL ) (2.5 MG/3ML) 0.083% nebulizer solution Take 2.5 mg by nebulization every 4 (four) hours as needed for wheezing or shortness of breath.    [provider]  apixaban (ELIQUIS) 5 MG TABS tablet Take 5 mg by mouth 2 (two) times daily. 07/03/22   [provider]  aspirin  EC 81 MG tablet Take 1 tablet (81 mg total) by mouth daily. 03/16/15   Evonnie Lenis, MD  atorvastatin (LIPITOR) 40 MG tablet Take 40 mg by mouth daily. 10/18/19   [provider]  atorvastatin (LIPITOR) 40 MG tablet Take 40 mg by mouth at bedtime. 01/13/20   [provider]  celecoxib  (CELEBREX ) 100 MG capsule Take 1 capsule (100 mg total) by mouth 2 (two) times daily. 06/19/19   Nitka, James E, MD  cephALEXin  (KEFLEX ) 500 MG capsule Take 1 capsule (500 mg total) by mouth 3 (three) times daily. 01/10/23   Haviland, Julie, MD  cetirizine (ZYRTEC) 10 MG tablet Take 10 mg by mouth daily. 09/08/19 11/05/23  [provider]  clonazePAM  (KLONOPIN ) 0.5 MG tablet Take 0.5 mg by mouth at bedtime as needed for anxiety. 04/28/20   [provider]  cyclobenzaprine  (FLEXERIL ) 5 MG tablet Take 1 tablet (5 mg total) by mouth 3 (three) times daily as  needed. Patient taking differently: Take 5 mg by mouth 3 (three) times daily as needed for muscle spasms. 08/20/21   Patt Lenis Macho, MD  diclofenac  Sodium (VOLTAREN ) 1 % GEL APPLY 2 TO 4 GRAMS TOPICALLY TO THE AFFECTED AREA 2 TO 3 TIMES DAILY Patient taking differently: Apply 2-4 g topically See admin instructions. APPLY 2 TO 4 GRAMS TOPICALLY TO THE AFFECTED AREA 2 TO 3 TIMES DAILY 10/25/21   Nitka, James E, MD  DOK 100 MG capsule Take 100 mg by mouth 2 (two) times daily. 03/27/19   [provider]  ergocalciferol (VITAMIN D2) 1.25 MG (50000 UT) capsule Take 50,000 Units by mouth once a week. 03/13/19   [provider]  escitalopram (LEXAPRO) 10 MG tablet Take 10 mg by mouth daily.    [provider]  fluconazole  (DIFLUCAN ) 150 MG tablet Take 150 mg by mouth once. 04/28/20   [provider]  fluticasone (FLONASE) 50 MCG/ACT nasal spray Place 2 sprays into both nostrils daily. 01/12/20   [provider]  Fluticasone-Umeclidin-Vilant (TRELEGY ELLIPTA) 100-62.5-25 MCG/INH AEPB Inhale 1 puff into the lungs daily. 02/17/19   [provider]  HYDROcodone -acetaminophen  (NORCO) 10-325 MG tablet Take 0.5 tablets by mouth in the morning, at noon, and at bedtime. 02/01/22   Nitka, James E, MD  HYDROcodone -acetaminophen  (NORCO/VICODIN) 5-325 MG tablet Take 1 tablet by  mouth 3 (three) times daily. 02/15/22   Nitka, James E, MD  hydrOXYzine (ATARAX/VISTARIL) 25 MG tablet Take 25 mg by mouth every 8 (eight) hours as needed for anxiety. 03/04/19   [provider]  levofloxacin (LEVAQUIN) 750 MG tablet Take 750 mg by mouth daily. 04/18/20   [provider]  levothyroxine  (SYNTHROID , LEVOTHROID) 50 MCG tablet Take 1 tablet by mouth daily. 09/04/17   [provider]  loratadine (CLARITIN) 10 MG tablet Take 10 mg by mouth daily. 06/25/19   [provider]  Misc. Devices MISC 1 each by Other route daily. 10/28/19   [provider]   mupirocin  cream (BACTROBAN ) 2 % Apply 1 Application topically 2 (two) times daily. 01/10/23   Haviland, Julie, MD  omeprazole (PRILOSEC) 20 MG capsule Take 20 mg by mouth 2 (two) times daily.    [provider]  ondansetron  (ZOFRAN -ODT) 4 MG disintegrating tablet Take 1 tablet (4 mg total) by mouth every 8 (eight) hours as needed for nausea or vomiting. 12/21/21   Towana Ozell BROCKS, MD  pantoprazole  (PROTONIX ) 40 MG tablet Take 40 mg by mouth daily. 02/15/20   [provider]  predniSONE  (STERAPRED UNI-Sutton 21 TAB) 5 MG (21) TBPK tablet Take by mouth. 04/30/20   [provider]  pregabalin (LYRICA) 100 MG capsule Take 100 mg by mouth 2 (two) times daily. 03/16/20   [provider]  promethazine  (PHENERGAN ) 25 MG tablet Take 25 mg by mouth 3 (three) times daily as needed for nausea or vomiting.    [provider]  traMADol  (ULTRAM ) 50 MG tablet Take 1 tablet (50 mg total) by mouth every 12 (twelve) hours as needed. 12/09/21   Quin Lynwood HERO, PA-C  traZODone  (DESYREL ) 100 MG tablet Take 100 mg by mouth at bedtime as needed for sleep.    [provider]  VIIBRYD 40 MG TABS Take 40 mg by mouth daily. 02/10/20   [provider]    Allergies: Trazodone  and Mirtazapine    Review of Systems  Updated Vital Signs BP (!) 117/106   Pulse 70   Temp 99.4 F (37.4 C)   Resp 19   Ht 5' 3 (1.6 m)   Wt 51.7 kg   SpO2 94%   BMI 20.19 kg/m   Physical Exam Vitals and nursing note reviewed.  Musculoskeletal:        General: Tenderness present. No swelling. Normal range of motion.     Cervical back: Normal range of motion.     Comments: No swelling of the knee.  No redness, cold joint.  Patient with pain with ambulation, pressure on the knee.  No obvious deformity.     (all labs ordered are listed, but only abnormal results are displayed) Labs Reviewed - No data to display  EKG: None  Radiology: CT Knee Right Wo Contrast Result Date:  12/03/2023 CLINICAL DATA:  Pain, swelling EXAM: CT OF THE RIGHT KNEE WITHOUT CONTRAST TECHNIQUE: Multidetector CT imaging of the right knee was performed according to the standard protocol. Multiplanar CT image reconstructions were also generated. RADIATION DOSE REDUCTION: This exam was performed according to the departmental dose-optimization program which includes automated exposure control, adjustment of the mA and/or kV according to patient size and/or use of iterative reconstruction technique. COMPARISON:  Plain films today FINDINGS: Bones/Joint/Cartilage Prior right knee replacement. Hardware causes beam hardening artifact. No visible acute bony abnormality. No visible fracture, subluxation or dislocation. No hardware complicating feature seen. Small joint effusion within the right  knee. Ligaments Suboptimally assessed by CT. Muscles and Tendons Negative Soft tissues Negative IMPRESSION: Prior right knee replacement. No visible complicating feature or acute bony abnormality. Small right knee joint effusion. Electronically Signed   By: Franky Crease M.D.   On: 12/03/2023 20:13   DG Knee Complete 4 Views Right Result Date: 12/03/2023 CLINICAL DATA:  Pain and swelling EXAM: RIGHT KNEE - COMPLETE 4+ VIEW COMPARISON:  January 23, 2022 FINDINGS: Right knee arthroplasty with patellar resurfacing, anatomically aligned, without findings of loosening. Apparent lucency along the medial condyle of the distal femur, likely artifact from overlying skin fold. No acute fracture or dislocation. No joint effusion. There is no evidence of arthropathy or other focal bone abnormality. Soft tissues are unremarkable. IMPRESSION: Apparent lucency along the medial condyle of the distal femur, likely artifact from overlying skin fold. Otherwise, the right knee arthroplasty is anatomically aligned without findings of loosening. No acute, displaced fracture or dislocation. Electronically Signed   By: Rogelia Myers M.D.   On: 12/03/2023  17:00     Procedures   Medications Ordered in the ED  oxyCODONE -acetaminophen  (PERCOCET/ROXICET) 5-325 MG per tablet 1 tablet (1 tablet Oral Given 12/03/23 1856)                                    Medical Decision Making Patient with acute on chronic knee pain.  Differential diagnoses include occult fracture, prosthesis wear and tear, arthritis.  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  Her plain films were negative.  Given her discomfort, will obtain CT imaging to better assess for possible underlying occult fracture.  No evidence of infected joint.  Symptoms not consistent with vascular injury.  Reviewed the patient's most recent PCP note.  Reviewed patient's home medications.  Will provide her with some oxycodone  here.  Reassessment 8:20 PM-patient CT knee negative.  Will have her follow-up with her orthopedic doctor.  Amount and/or Complexity of Data Reviewed Radiology: ordered.  Risk Prescription drug management.        Final diagnoses:  Acute pain of right knee    ED Discharge Orders     None          Mannie Fairy DASEN, DO 12/03/23 2025

## 2023-12-03 NOTE — Discharge Instructions (Addendum)
 While you were in the emergency room, you had an x-ray and a CT scan done of your knee.  These tests were normal.  We did not see any fractures or cause for your pain.  My suspicion is that you may be nearing the end of your knee replacement lifespan.  Please make an appointment with your orthopedic doctor to be seen and evaluated.  You may continue taking your prescribed pain medications at home.  I recommend applying some ice to the area.

## 2023-12-03 NOTE — ED Triage Notes (Signed)
 Pt POV- c/o RLE pain since Saturday. Reports feels like something slipped in my knee.  Denies known injury. Reports difficulty bearing weight. Denies swelling.   Hx of R knee replacement.   Took hydrocodone  appx 1130, mild relief.

## 2023-12-17 ENCOUNTER — Ambulatory Visit (HOSPITAL_BASED_OUTPATIENT_CLINIC_OR_DEPARTMENT_OTHER)

## 2023-12-17 DIAGNOSIS — I480 Paroxysmal atrial fibrillation: Secondary | ICD-10-CM

## 2023-12-20 ENCOUNTER — Ambulatory Visit: Payer: Self-pay | Admitting: Cardiology

## 2023-12-27 DIAGNOSIS — F411 Generalized anxiety disorder: Secondary | ICD-10-CM | POA: Insufficient documentation

## 2024-01-11 ENCOUNTER — Ambulatory Visit: Admitting: Cardiology

## 2024-01-22 ENCOUNTER — Ambulatory Visit (HOSPITAL_BASED_OUTPATIENT_CLINIC_OR_DEPARTMENT_OTHER)

## 2024-01-24 ENCOUNTER — Ambulatory Visit: Admitting: Cardiology

## 2024-02-08 DIAGNOSIS — M4826 Kissing spine, lumbar region: Secondary | ICD-10-CM | POA: Insufficient documentation

## 2024-02-18 ENCOUNTER — Ambulatory Visit (HOSPITAL_BASED_OUTPATIENT_CLINIC_OR_DEPARTMENT_OTHER)

## 2024-03-12 ENCOUNTER — Ambulatory Visit (HOSPITAL_BASED_OUTPATIENT_CLINIC_OR_DEPARTMENT_OTHER)

## 2024-03-13 ENCOUNTER — Ambulatory Visit: Admitting: Cardiology

## 2024-04-10 ENCOUNTER — Ambulatory Visit (HOSPITAL_BASED_OUTPATIENT_CLINIC_OR_DEPARTMENT_OTHER)

## 2024-04-15 DIAGNOSIS — R630 Anorexia: Secondary | ICD-10-CM | POA: Insufficient documentation

## 2024-05-08 DIAGNOSIS — G3184 Mild cognitive impairment, so stated: Secondary | ICD-10-CM | POA: Insufficient documentation

## 2024-05-13 ENCOUNTER — Emergency Department (HOSPITAL_BASED_OUTPATIENT_CLINIC_OR_DEPARTMENT_OTHER)

## 2024-05-13 ENCOUNTER — Encounter (HOSPITAL_BASED_OUTPATIENT_CLINIC_OR_DEPARTMENT_OTHER): Payer: Self-pay

## 2024-05-13 ENCOUNTER — Emergency Department (HOSPITAL_BASED_OUTPATIENT_CLINIC_OR_DEPARTMENT_OTHER)
Admission: EM | Admit: 2024-05-13 | Discharge: 2024-05-13 | Disposition: A | Discharge status: Home / Self Care | Source: Home / Self Care | Attending: Emergency Medicine | Admitting: Emergency Medicine

## 2024-05-13 ENCOUNTER — Other Ambulatory Visit: Payer: Self-pay

## 2024-05-13 DIAGNOSIS — Z79890 Hormone replacement therapy (postmenopausal): Secondary | ICD-10-CM | POA: Diagnosis not present

## 2024-05-13 DIAGNOSIS — E039 Hypothyroidism, unspecified: Secondary | ICD-10-CM | POA: Diagnosis not present

## 2024-05-13 DIAGNOSIS — J4489 Other specified chronic obstructive pulmonary disease: Secondary | ICD-10-CM | POA: Diagnosis not present

## 2024-05-13 DIAGNOSIS — M545 Low back pain, unspecified: Secondary | ICD-10-CM

## 2024-05-13 DIAGNOSIS — N3000 Acute cystitis without hematuria: Secondary | ICD-10-CM

## 2024-05-13 DIAGNOSIS — R10A0 Flank pain, unspecified side: Secondary | ICD-10-CM | POA: Diagnosis present

## 2024-05-13 DIAGNOSIS — Z7982 Long term (current) use of aspirin: Secondary | ICD-10-CM | POA: Diagnosis not present

## 2024-05-13 LAB — URINALYSIS, ROUTINE W REFLEX MICROSCOPIC
Bilirubin Urine: NEGATIVE
Glucose, UA: NEGATIVE mg/dL
Hgb urine dipstick: NEGATIVE
Ketones, ur: NEGATIVE mg/dL
Nitrite: POSITIVE — AB
Protein, ur: NEGATIVE mg/dL
Specific Gravity, Urine: 1.01 (ref 1.005–1.030)
pH: 5.5 (ref 5.0–8.0)

## 2024-05-13 LAB — COMPREHENSIVE METABOLIC PANEL WITH GFR
ALT: 23 U/L (ref 0–44)
AST: 23 U/L (ref 15–41)
Albumin: 4 g/dL (ref 3.5–5.0)
Alkaline Phosphatase: 53 U/L (ref 38–126)
Anion gap: 12 (ref 5–15)
BUN: 10 mg/dL (ref 8–23)
CO2: 24 mmol/L (ref 22–32)
Calcium: 8.9 mg/dL (ref 8.9–10.3)
Chloride: 103 mmol/L (ref 98–111)
Creatinine, Ser: 1.14 mg/dL — ABNORMAL HIGH (ref 0.44–1.00)
GFR, Estimated: 50 mL/min — ABNORMAL LOW (ref >60)
Glucose, Bld: 122 mg/dL — ABNORMAL HIGH (ref 70–99)
Potassium: 3.8 mmol/L (ref 3.5–5.1)
Sodium: 139 mmol/L (ref 135–145)
Total Bilirubin: 0.4 mg/dL (ref 0.0–1.2)
Total Protein: 6.3 g/dL — ABNORMAL LOW (ref 6.5–8.1)

## 2024-05-13 LAB — CBC
HCT: 38.6 % (ref 36.0–46.0)
Hemoglobin: 13 g/dL (ref 12.0–15.0)
MCH: 33.2 pg (ref 26.0–34.0)
MCHC: 33.7 g/dL (ref 30.0–36.0)
MCV: 98.7 fL (ref 80.0–100.0)
Platelets: 270 K/uL (ref 150–400)
RBC: 3.91 MIL/uL (ref 3.87–5.11)
RDW: 15.6 % — ABNORMAL HIGH (ref 11.5–15.5)
WBC: 15 K/uL — ABNORMAL HIGH (ref 4.0–10.5)
nRBC: 0 % (ref 0.0–0.2)

## 2024-05-13 LAB — URINALYSIS, MICROSCOPIC (REFLEX)

## 2024-05-13 LAB — LACTIC ACID, PLASMA: Lactic Acid, Venous: 1.1 mmol/L (ref 0.5–1.9)

## 2024-05-13 MED ORDER — CEPHALEXIN 500 MG PO CAPS: 500.0000 mg | ORAL_CAPSULE | Freq: Two times a day (BID) | ORAL | 0 refills | Status: AC

## 2024-05-13 MED ORDER — MORPHINE SULFATE (PF) 4 MG/ML IV SOLN: 4.0000 mg | Freq: Once | INTRAVENOUS | Status: AC

## 2024-05-13 MED ORDER — MORPHINE SULFATE (PF) 4 MG/ML IV SOLN
INTRAVENOUS | Status: AC
  Administered 2024-05-13: 23:00:00 4 mg via INTRAVENOUS
  Filled 2024-05-13: qty 1

## 2024-05-13 MED ORDER — SODIUM CHLORIDE 0.9 % IV SOLN: 1.0000 g | Freq: Once | INTRAVENOUS | Status: AC

## 2024-05-13 MED ORDER — SODIUM CHLORIDE 0.9 % IV SOLN
INTRAVENOUS | Status: AC
  Administered 2024-05-13: 23:00:00 1 g via INTRAVENOUS
  Filled 2024-05-13: qty 10

## 2024-05-13 NOTE — Discharge Instructions (Signed)
Make an appointment to have close follow-up with your primary care doctor.  Return to the emergency room if you have any worsening symptoms. 

## 2024-05-13 NOTE — ED Provider Notes (Signed)
 [place] [facility] Provider Note   CSN: [id number] Arrival date & time: 05/13/24  1819     Patient presents with: Back Pain   Patricia Sutton is a 78 y.o. female.   Patient is a 78 year old who presents with back pain.  She has a history of chronic back pain.  She says has been worse since October when she fell with her rolling walker.  However she said over the last 2 to 3 days she has had pain in her left back.  It is different than her typical pain.  She also had radiation to her left flank area.  No radiation down her legs.  No numbness or weakness to her legs.  No urinary symptoms although she does say she has had prior urinary infections.  No nausea or vomiting.  No fevers.  No loss of bowel or bladder control.       Prior to Admission medications  Medication Sig Start Date End Date Taking? Authorizing Provider  cephALEXin  (KEFLEX ) 500 MG capsule Take 1 capsule (500 mg total) by mouth 2 (two) times daily. 05/13/24  Yes Lenor Hollering, MD  albuterol  (PROVENTIL ) (2.5 MG/3ML) 0.083% nebulizer solution Take 2.5 mg by nebulization every 4 (four) hours as needed for wheezing or shortness of breath.    [provider]  apixaban (ELIQUIS) 5 MG TABS tablet Take 5 mg by mouth 2 (two) times daily. 07/03/22   [provider]  aspirin  EC 81 MG tablet Take 1 tablet (81 mg total) by mouth daily. 03/16/15   Evonnie Lenis, MD  atorvastatin (LIPITOR) 40 MG tablet Take 40 mg by mouth daily. 10/18/19   [provider]  atorvastatin (LIPITOR) 40 MG tablet Take 40 mg by mouth at bedtime. 01/13/20   [provider]  celecoxib  (CELEBREX ) 100 MG capsule Take 1 capsule (100 mg total) by mouth 2 (two) times daily. 06/19/19   Nitka, James E, MD  cetirizine (ZYRTEC) 10 MG tablet Take 10 mg by mouth daily. 09/08/19 11/05/23  [provider]  clonazePAM  (KLONOPIN ) 0.5 MG tablet Take 0.5 mg by mouth at bedtime as needed for anxiety. 04/28/20    [provider]  cyclobenzaprine  (FLEXERIL ) 5 MG tablet Take 1 tablet (5 mg total) by mouth 3 (three) times daily as needed. Patient taking differently: Take 5 mg by mouth 3 (three) times daily as needed for muscle spasms. 08/20/21   Patt Lenis Macho, MD  diclofenac  Sodium (VOLTAREN ) 1 % GEL APPLY 2 TO 4 GRAMS TOPICALLY TO THE AFFECTED AREA 2 TO 3 TIMES DAILY Patient taking differently: Apply 2-4 g topically See admin instructions. APPLY 2 TO 4 GRAMS TOPICALLY TO THE AFFECTED AREA 2 TO 3 TIMES DAILY 10/25/21   Nitka, James E, MD  DOK 100 MG capsule Take 100 mg by mouth 2 (two) times daily. 03/27/19   [provider]  ergocalciferol (VITAMIN D2) 1.25 MG (50000 UT) capsule Take 50,000 Units by mouth once a week. 03/13/19   [provider]  escitalopram (LEXAPRO) 10 MG tablet Take 10 mg by mouth daily.    [provider]  fluconazole  (DIFLUCAN ) 150 MG tablet Take 150 mg by mouth once. 04/28/20   [provider]  fluticasone (FLONASE) 50 MCG/ACT nasal spray Place 2 sprays into both nostrils daily. 01/12/20   [provider]  Fluticasone-Umeclidin-Vilant (TRELEGY ELLIPTA) 100-62.5-25 MCG/INH AEPB Inhale 1 puff into the lungs daily. 02/17/19   [provider]  HYDROcodone -acetaminophen  (NORCO) 10-325 MG  tablet Take 0.5 tablets by mouth in the morning, at noon, and at bedtime. 02/01/22   Nitka, James E, MD  HYDROcodone -acetaminophen  (NORCO/VICODIN) 5-325 MG tablet Take 1 tablet by mouth 3 (three) times daily. 02/15/22   Nitka, James E, MD  hydrOXYzine (ATARAX/VISTARIL) 25 MG tablet Take 25 mg by mouth every 8 (eight) hours as needed for anxiety. 03/04/19   [provider]  levofloxacin (LEVAQUIN) 750 MG tablet Take 750 mg by mouth daily. 04/18/20   [provider]  levothyroxine  (SYNTHROID , LEVOTHROID) 50 MCG tablet Take 1 tablet by mouth daily. 09/04/17   [provider]  loratadine (CLARITIN) 10 MG tablet Take 10 mg by  mouth daily. 06/25/19   [provider]  Misc. Devices MISC 1 each by Other route daily. 10/28/19   [provider]  mupirocin  cream (BACTROBAN ) 2 % Apply 1 Application topically 2 (two) times daily. 01/10/23   Haviland, Julie, MD  omeprazole (PRILOSEC) 20 MG capsule Take 20 mg by mouth 2 (two) times daily.    [provider]  ondansetron  (ZOFRAN -ODT) 4 MG disintegrating tablet Take 1 tablet (4 mg total) by mouth every 8 (eight) hours as needed for nausea or vomiting. 12/21/21   Towana Ozell BROCKS, MD  pantoprazole  (PROTONIX ) 40 MG tablet Take 40 mg by mouth daily. 02/15/20   [provider]  predniSONE  (STERAPRED UNI-PAK 21 TAB) 5 MG (21) TBPK tablet Take by mouth. 04/30/20   [provider]  pregabalin (LYRICA) 100 MG capsule Take 100 mg by mouth 2 (two) times daily. 03/16/20   [provider]  promethazine  (PHENERGAN ) 25 MG tablet Take 25 mg by mouth 3 (three) times daily as needed for nausea or vomiting.    [provider]  traMADol  (ULTRAM ) 50 MG tablet Take 1 tablet (50 mg total) by mouth every 12 (twelve) hours as needed. 12/09/21   Quin Lynwood HERO, PA-C  traZODone  (DESYREL ) 100 MG tablet Take 100 mg by mouth at bedtime as needed for sleep.    [provider]  VIIBRYD 40 MG TABS Take 40 mg by mouth daily. 02/10/20   [provider]    Allergies: Trazodone  and Mirtazapine    Review of Systems  Constitutional:  Negative for chills, diaphoresis, fatigue and fever.  HENT:  Negative for congestion, rhinorrhea and sneezing.   Eyes: Negative.   Respiratory:  Negative for cough, chest tightness and shortness of breath.   Cardiovascular:  Negative for chest pain and leg swelling.  Gastrointestinal:  Negative for abdominal pain, diarrhea, nausea and vomiting.  Genitourinary:  Positive for flank pain. Negative for difficulty urinating, frequency and hematuria.  Musculoskeletal:  Positive for back pain. Negative for arthralgias.   Skin:  Negative for rash.  Neurological:  Negative for dizziness, speech difficulty, weakness, numbness and headaches.    Updated Vital Signs BP 113/67 (BP Location: Right Arm)   Pulse 65   Temp (!) 97.3 F (36.3 C) (Oral)   Resp (!) 21   Wt 49.9 kg   SpO2 93%   BMI 19.49 kg/m   Physical Exam Constitutional:      Appearance: She is well-developed.  HENT:     Head: Normocephalic and atraumatic.  Eyes:     Pupils: Pupils are equal, round, and reactive to light.  Cardiovascular:     Rate and Rhythm: Normal rate and regular rhythm.     Heart sounds: Normal heart sounds.  Pulmonary:     Effort: Pulmonary effort is normal. No respiratory distress.  Breath sounds: Normal breath sounds. No wheezing or rales.  Chest:     Chest wall: No tenderness.  Abdominal:     General: Bowel sounds are normal.     Palpations: Abdomen is soft.     Tenderness: There is no abdominal tenderness. There is no guarding or rebound.  Musculoskeletal:        General: Normal range of motion.     Cervical back: Normal range of motion and neck supple.     Comments: Positive tenderness in the right mid back area.  There is some mild tenderness in the right flank.  No rash.  Lymphadenopathy:     Cervical: No cervical adenopathy.  Skin:    General: Skin is warm and dry.     Findings: No rash.  Neurological:     General: No focal deficit present.     Mental Status: She is alert and oriented to person, place, and time.     Comments: Motor 5 out of 5 all extremities, sensation grossly intact to light touch all extremities     (all labs ordered are listed, but only abnormal results are displayed) Labs Reviewed  COMPREHENSIVE METABOLIC PANEL WITH GFR - Abnormal; Notable for the following components:      Result Value   Glucose, Bld 122 (*)    Creatinine, Ser 1.14 (*)    Total Protein 6.3 (*)    GFR, Estimated 50 (*)    All other components within normal limits  CBC - Abnormal; Notable for the  following components:   WBC 15.0 (*)    RDW 15.6 (*)    All other components within normal limits  URINALYSIS, ROUTINE W REFLEX MICROSCOPIC - Abnormal; Notable for the following components:   APPearance HAZY (*)    Nitrite POSITIVE (*)    Leukocytes,Ua SMALL (*)    All other components within normal limits  URINALYSIS, MICROSCOPIC (REFLEX) - Abnormal; Notable for the following components:   Bacteria, UA MANY (*)    All other components within normal limits  CULTURE, BLOOD (ROUTINE X 2)  CULTURE, BLOOD (ROUTINE X 2)  LACTIC ACID, PLASMA  LACTIC ACID, PLASMA    EKG: None  Radiology: CT Renal Stone Study Result Date: 05/13/2024 EXAM: CT ABDOMEN AND PELVIS WITHOUT CONTRAST 05/13/2024 10:54:16 PM TECHNIQUE: CT of the abdomen and pelvis was performed without the administration of intravenous contrast. Multiplanar reformatted images are provided for review. Automated exposure control, iterative reconstruction, and/or weight-based adjustment of the mA/kV was utilized to reduce the radiation dose to as low as reasonably achievable. COMPARISON: 01/18/2024 CLINICAL HISTORY: Abdominal/flank pain, stone suspected. Left-sided abdominal pain following a fall 2 months ago, initial encounter. FINDINGS: LOWER CHEST: No acute abnormality. LIVER: The liver is unremarkable. GALLBLADDER AND BILE DUCTS: Gallbladder has been surgically removed. No biliary ductal dilatation. SPLEEN: No acute abnormality. PANCREAS: No acute abnormality. ADRENAL GLANDS: No acute abnormality. KIDNEYS, URETERS AND BLADDER: No renal calculi or obstructive changes are seen. No perinephric or periureteral stranding. The bladder is well distended. GI AND BOWEL: Gastrostomy catheter is noted in place. Small bowel and stomach are otherwise within normal limits. The appendix is not well visualized and may have been surgically removed. No inflammatory changes to suggest appendicitis are noted. There is no bowel obstruction. PERITONEUM AND  RETROPERITONEUM: No ascites. No free air. VASCULATURE: Aortic calcifications are seen without aneurysmal dilatation. LYMPH NODES: No lymphadenopathy. REPRODUCTIVE ORGANS: The uterus has been surgically removed. BONES AND SOFT TISSUES: No acute osseous abnormality. No focal  soft tissue abnormality. IMPRESSION: 1. No acute abnormality in the abdomen or pelvis. Electronically signed by: Oneil Devonshire MD 05/13/2024 11:25 PM EST RP Workstation: [id number]     Procedures   Medications Ordered in the ED  cefTRIAXone  (ROCEPHIN ) 1 g in sodium chloride  0.9 % 100 mL IVPB (1 g Intravenous New Bag/Given 05/13/24 2241)  morphine  (PF) 4 MG/ML injection 4 mg (4 mg Intravenous Given 05/13/24 2233)                                    Medical Decision Making Amount and/or Complexity of Data Reviewed Labs: ordered. Radiology: ordered.  Risk Prescription drug management.   This patient presents to the ED for concern of back pain, this involves an extensive number of treatment options, and is a complaint that carries with it a high risk of complications and morbidity.  I considered the following differential and admission for this acute, potentially life threatening condition.  The differential diagnosis includes musculoskeletal pain, herniated disc, kidney stone, pyelonephritis, aortic dissection, aortic aneurysm discitis, epidural abscess  MDM:    Patient is a 78 year old who presents with left side back pain.  She does not have any radicular symptoms or signs of cauda equina.  She does not have any fever which would be more concerning for spinal infection.  Her urine does look infected.  She does not appear systemically ill.  She does not have any vomiting.  No fever.  Her WBC count is elevated.  Lactic acid is normal.  No concerns for sepsis.  She was given dose of IV antibiotics.  She was also given pain medication.  CT scan does not show any evidence of kidney stone or other acute abnormality.  She is  feeling better.  Was given a dose of Rocephin  here in the ED.  Will start her on Keflex  based on her prior cultures.  She was discharged home in good condition.  Was encouraged to follow-up with her PCP within the next few days.  Return precautions were given.  (Labs, imaging, consults)  Labs: I Ordered, and personally interpreted labs.  The pertinent results include: Elevated WBC count, UTI  Imaging Studies ordered: I ordered imaging studies including CT abdomen pelvis I independently visualized and interpreted imaging. I agree with the radiologist interpretation  Additional history obtained from family member at bedside.  External records from outside source obtained and reviewed including history  Cardiac Monitoring: The patient was not maintained on a cardiac monitor.  If on the cardiac monitor, I personally viewed and interpreted the cardiac monitored which showed an underlying rhythm of:    Reevaluation: After the interventions noted above, I reevaluated the patient and found that they have :improved  Social Determinants of Health:    Disposition: Discharged to home  Co morbidities that complicate the patient evaluation  Past Medical History:  Diagnosis Date   Anxiety    Arthritis    Asthma    Back pain, chronic    Bone spur of other site    spine   Chronic back pain    spurs on spine   COPD (chronic obstructive pulmonary disease) (HCC)    denies SOB   Depression    takes Effexor  daily   GERD (gastroesophageal reflux disease)    takes Omeprazole daily   History of bronchitis    last time about 6+yrs ago   History of colon polyps  History of kidney stones    Hypothyroidism    takes Synthroid  daily   Insomnia    takes Trazodone  nightly and Ambien    Joint pain    Joint swelling    Nocturia    Pneumonia    hx of;last time about 6+yrs ago   PONV (postoperative nausea and vomiting)    Renal insufficiency    Sleep apnea    no CPAP; uses O2 at night 1l/min.    Urinary urgency      Medicines Meds ordered this encounter  Medications   cefTRIAXone  (ROCEPHIN ) 1 g in sodium chloride  0.9 % 100 mL IVPB    Antibiotic Indication::   UTI   morphine  (PF) 4 MG/ML injection 4 mg   morphine  (PF) 4 MG/ML injection    Nicholaus, Misty L: cabinet override   sodium chloride  0.9 % with cefTRIAXone  (ROCEPHIN ) ADS Med    Nicholaus, Misty L: cabinet override   cephALEXin  (KEFLEX ) 500 MG capsule    Sig: Take 1 capsule (500 mg total) by mouth 2 (two) times daily.    Dispense:  14 capsule    Refill:  0    I have reviewed the patients home medicines and have made adjustments as needed  Problem List / ED Course: Problem List Items Addressed This Visit   None Visit Diagnoses       Acute left-sided low back pain without sciatica    -  Primary   Relevant Medications   morphine  (PF) 4 MG/ML injection 4 mg (Completed)     Acute cystitis without hematuria                    Final diagnoses:  Acute left-sided low back pain without sciatica  Acute cystitis without hematuria    ED Discharge Orders          Ordered    cephALEXin  (KEFLEX ) 500 MG capsule  2 times daily        05/13/24 2333               Lenor Hollering, MD 05/13/24 2334

## 2024-05-13 NOTE — ED Triage Notes (Signed)
 Pt arrives with c/o lower back pain. Per pt, she had a fall in October and the pain has worsen since the fall. Pt reports the pain is on the left side and radiates into her left leg. Pt denies urinary/stool incontinence.

## 2024-05-13 NOTE — ED Notes (Signed)

## 2024-05-13 NOTE — ED Notes (Signed)
 Pt. Has noted skin tears on her Legs and arms that have been there for weeks and days.  Pt. Has no bleeding skin tears.  Pt. Has red areas on her skin and states they are from the blood thinners.  Pt. Report she has a feeding tube due to no appetite and she lost weight from the times of Covid.  She uses the feeding tube 4 times a day when she starts losing weight.

## 2024-05-19 LAB — CULTURE, BLOOD (ROUTINE X 2)
Culture: NO GROWTH
Culture: NO GROWTH
Special Requests: ADEQUATE
Special Requests: ADEQUATE

## 2024-05-20 DIAGNOSIS — F900 Attention-deficit hyperactivity disorder, predominantly inattentive type: Secondary | ICD-10-CM | POA: Insufficient documentation

## 2024-06-16 ENCOUNTER — Encounter: Payer: Self-pay | Admitting: *Deleted

## 2024-06-17 ENCOUNTER — Ambulatory Visit: Attending: Cardiology | Admitting: Cardiology

## 2024-06-17 ENCOUNTER — Ambulatory Visit (HOSPITAL_COMMUNITY): Admission: RE | Admit: 2024-06-17 | Source: Ambulatory Visit
